# Patient Record
Sex: Female | Born: 1980
Health system: Southern US, Community
[De-identification: ages and names within clinical notes are randomized; demographics above are authoritative.]

## PROBLEM LIST (undated history)

## (undated) ENCOUNTER — Inpatient Hospital Stay (HOSPITAL_COMMUNITY): Payer: Self-pay

## (undated) DIAGNOSIS — N939 Abnormal uterine and vaginal bleeding, unspecified: Secondary | ICD-10-CM

## (undated) DIAGNOSIS — E282 Polycystic ovarian syndrome: Secondary | ICD-10-CM

## (undated) DIAGNOSIS — F411 Generalized anxiety disorder: Secondary | ICD-10-CM

## (undated) DIAGNOSIS — Z8669 Personal history of other diseases of the nervous system and sense organs: Secondary | ICD-10-CM

## (undated) DIAGNOSIS — Z803 Family history of malignant neoplasm of breast: Secondary | ICD-10-CM

## (undated) DIAGNOSIS — K602 Anal fissure, unspecified: Secondary | ICD-10-CM

## (undated) DIAGNOSIS — D649 Anemia, unspecified: Secondary | ICD-10-CM

## (undated) DIAGNOSIS — O139 Gestational [pregnancy-induced] hypertension without significant proteinuria, unspecified trimester: Secondary | ICD-10-CM

## (undated) DIAGNOSIS — M1990 Unspecified osteoarthritis, unspecified site: Secondary | ICD-10-CM

## (undated) DIAGNOSIS — M62838 Other muscle spasm: Secondary | ICD-10-CM

## (undated) DIAGNOSIS — Z8042 Family history of malignant neoplasm of prostate: Secondary | ICD-10-CM

## (undated) DIAGNOSIS — F329 Major depressive disorder, single episode, unspecified: Secondary | ICD-10-CM

## (undated) DIAGNOSIS — Z8719 Personal history of other diseases of the digestive system: Secondary | ICD-10-CM

## (undated) DIAGNOSIS — L680 Hirsutism: Secondary | ICD-10-CM

## (undated) DIAGNOSIS — F32A Depression, unspecified: Secondary | ICD-10-CM

## (undated) DIAGNOSIS — D259 Leiomyoma of uterus, unspecified: Secondary | ICD-10-CM

## (undated) DIAGNOSIS — G709 Myoneural disorder, unspecified: Secondary | ICD-10-CM

## (undated) DIAGNOSIS — Z973 Presence of spectacles and contact lenses: Secondary | ICD-10-CM

## (undated) DIAGNOSIS — Z808 Family history of malignant neoplasm of other organs or systems: Secondary | ICD-10-CM

## (undated) DIAGNOSIS — H539 Unspecified visual disturbance: Secondary | ICD-10-CM

## (undated) DIAGNOSIS — T7840XA Allergy, unspecified, initial encounter: Secondary | ICD-10-CM

## (undated) DIAGNOSIS — Z8041 Family history of malignant neoplasm of ovary: Secondary | ICD-10-CM

## (undated) DIAGNOSIS — IMO0002 Reserved for concepts with insufficient information to code with codable children: Secondary | ICD-10-CM

## (undated) DIAGNOSIS — M791 Myalgia, unspecified site: Secondary | ICD-10-CM

## (undated) DIAGNOSIS — Z806 Family history of leukemia: Secondary | ICD-10-CM

## (undated) DIAGNOSIS — K635 Polyp of colon: Secondary | ICD-10-CM

## (undated) DIAGNOSIS — G35 Multiple sclerosis: Secondary | ICD-10-CM

## (undated) DIAGNOSIS — F419 Anxiety disorder, unspecified: Secondary | ICD-10-CM

## (undated) DIAGNOSIS — N920 Excessive and frequent menstruation with regular cycle: Secondary | ICD-10-CM

## (undated) DIAGNOSIS — E669 Obesity, unspecified: Secondary | ICD-10-CM

## (undated) HISTORY — DX: Family history of malignant neoplasm of prostate: Z80.42

## (undated) HISTORY — DX: Multiple sclerosis: G35

## (undated) HISTORY — DX: Family history of malignant neoplasm of other organs or systems: Z80.8

## (undated) HISTORY — DX: Family history of malignant neoplasm of breast: Z80.3

## (undated) HISTORY — DX: Depression, unspecified: F32.A

## (undated) HISTORY — DX: Reserved for concepts with insufficient information to code with codable children: IMO0002

## (undated) HISTORY — DX: Family history of malignant neoplasm of ovary: Z80.41

## (undated) HISTORY — DX: Major depressive disorder, single episode, unspecified: F32.9

## (undated) HISTORY — DX: Family history of leukemia: Z80.6

## (undated) HISTORY — DX: Unspecified osteoarthritis, unspecified site: M19.90

## (undated) HISTORY — DX: Anal fissure, unspecified: K60.2

## (undated) HISTORY — DX: Polyp of colon: K63.5

## (undated) HISTORY — DX: Obesity, unspecified: E66.9

## (undated) HISTORY — DX: Unspecified visual disturbance: H53.9

## (undated) HISTORY — PX: KNEE ARTHROSCOPY: SHX127

## (undated) HISTORY — PX: DILATION AND CURETTAGE OF UTERUS: SHX78

## (undated) HISTORY — DX: Allergy, unspecified, initial encounter: T78.40XA

## (undated) HISTORY — PX: OTHER SURGICAL HISTORY: SHX169

## (undated) HISTORY — PX: FOOT SURGERY: SHX648

---
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
## 2001-01-02 ENCOUNTER — Encounter: Payer: Self-pay | Admitting: Orthopedic Surgery

## 2001-01-02 ENCOUNTER — Inpatient Hospital Stay (HOSPITAL_COMMUNITY): Admission: EM | Admit: 2001-01-02 | Discharge: 2001-01-04 | Payer: Self-pay | Admitting: Emergency Medicine

## 2001-01-02 ENCOUNTER — Encounter: Payer: Self-pay | Admitting: Emergency Medicine

## 2001-03-24 ENCOUNTER — Emergency Department (HOSPITAL_COMMUNITY): Admission: EM | Admit: 2001-03-24 | Discharge: 2001-03-24 | Payer: Self-pay | Admitting: Emergency Medicine

## 2001-04-09 ENCOUNTER — Ambulatory Visit (HOSPITAL_BASED_OUTPATIENT_CLINIC_OR_DEPARTMENT_OTHER): Admission: RE | Admit: 2001-04-09 | Discharge: 2001-04-10 | Payer: Self-pay | Admitting: Orthopedic Surgery

## 2001-04-09 HISTORY — PX: KNEE ARTHROSCOPY: SUR90

## 2001-04-10 ENCOUNTER — Ambulatory Visit (HOSPITAL_COMMUNITY): Admission: RE | Admit: 2001-04-10 | Discharge: 2001-04-10 | Payer: Self-pay | Admitting: Orthopedic Surgery

## 2001-06-17 ENCOUNTER — Ambulatory Visit (HOSPITAL_COMMUNITY): Admission: RE | Admit: 2001-06-17 | Discharge: 2001-06-17 | Payer: Self-pay | Admitting: Neurology

## 2001-07-09 ENCOUNTER — Encounter: Payer: Self-pay | Admitting: Orthopedic Surgery

## 2001-07-09 ENCOUNTER — Inpatient Hospital Stay (HOSPITAL_COMMUNITY): Admission: RE | Admit: 2001-07-09 | Discharge: 2001-07-10 | Payer: Self-pay | Admitting: Orthopedic Surgery

## 2001-07-09 HISTORY — PX: KNEE ARTHROSCOPY: SHX127

## 2001-10-08 ENCOUNTER — Other Ambulatory Visit: Admission: RE | Admit: 2001-10-08 | Discharge: 2001-10-08 | Payer: Self-pay | Admitting: Obstetrics and Gynecology

## 2001-11-24 ENCOUNTER — Emergency Department (HOSPITAL_COMMUNITY): Admission: EM | Admit: 2001-11-24 | Discharge: 2001-11-24 | Payer: Self-pay | Admitting: Emergency Medicine

## 2002-04-23 HISTORY — PX: FOOT TENDON TRANSFER: SHX1671

## 2003-02-08 ENCOUNTER — Encounter: Payer: Self-pay | Admitting: Emergency Medicine

## 2003-02-08 ENCOUNTER — Emergency Department (HOSPITAL_COMMUNITY): Admission: EM | Admit: 2003-02-08 | Discharge: 2003-02-08 | Payer: Self-pay | Admitting: Emergency Medicine

## 2003-04-24 DIAGNOSIS — K635 Polyp of colon: Secondary | ICD-10-CM

## 2003-04-24 HISTORY — DX: Polyp of colon: K63.5

## 2003-09-30 ENCOUNTER — Other Ambulatory Visit: Admission: RE | Admit: 2003-09-30 | Discharge: 2003-09-30 | Payer: Self-pay | Admitting: Obstetrics and Gynecology

## 2004-03-21 ENCOUNTER — Emergency Department (HOSPITAL_COMMUNITY): Admission: EM | Admit: 2004-03-21 | Discharge: 2004-03-21 | Payer: Self-pay

## 2004-04-23 DIAGNOSIS — G35 Multiple sclerosis: Secondary | ICD-10-CM

## 2004-04-23 HISTORY — DX: Multiple sclerosis: G35

## 2004-07-11 ENCOUNTER — Emergency Department (HOSPITAL_COMMUNITY): Admission: EM | Admit: 2004-07-11 | Discharge: 2004-07-11 | Payer: Self-pay | Admitting: *Deleted

## 2004-10-16 ENCOUNTER — Emergency Department (HOSPITAL_COMMUNITY): Admission: EM | Admit: 2004-10-16 | Discharge: 2004-10-16 | Payer: Self-pay | Admitting: Emergency Medicine

## 2004-12-14 ENCOUNTER — Encounter: Admission: RE | Admit: 2004-12-14 | Discharge: 2004-12-14 | Payer: Self-pay | Admitting: Allergy and Immunology

## 2005-02-05 ENCOUNTER — Emergency Department (HOSPITAL_COMMUNITY): Admission: EM | Admit: 2005-02-05 | Discharge: 2005-02-05 | Payer: Self-pay | Admitting: Emergency Medicine

## 2005-03-01 ENCOUNTER — Emergency Department (HOSPITAL_COMMUNITY): Admission: EM | Admit: 2005-03-01 | Discharge: 2005-03-01 | Payer: Self-pay | Admitting: Emergency Medicine

## 2005-03-22 ENCOUNTER — Emergency Department (HOSPITAL_COMMUNITY): Admission: EM | Admit: 2005-03-22 | Discharge: 2005-03-22 | Payer: Self-pay | Admitting: Emergency Medicine

## 2005-04-12 ENCOUNTER — Emergency Department (HOSPITAL_COMMUNITY): Admission: EM | Admit: 2005-04-12 | Discharge: 2005-04-12 | Payer: Self-pay | Admitting: Emergency Medicine

## 2005-04-23 HISTORY — PX: CHOLECYSTECTOMY: SHX55

## 2005-06-10 ENCOUNTER — Encounter: Admission: RE | Admit: 2005-06-10 | Discharge: 2005-06-10 | Payer: Self-pay | Admitting: Family Medicine

## 2005-07-21 ENCOUNTER — Encounter: Admission: RE | Admit: 2005-07-21 | Discharge: 2005-07-21 | Payer: Self-pay | Admitting: *Deleted

## 2005-08-06 ENCOUNTER — Ambulatory Visit (HOSPITAL_COMMUNITY): Admission: RE | Admit: 2005-08-06 | Discharge: 2005-08-06 | Payer: Self-pay | Admitting: *Deleted

## 2005-08-10 ENCOUNTER — Encounter: Admission: RE | Admit: 2005-08-10 | Discharge: 2005-08-10 | Payer: Self-pay | Admitting: *Deleted

## 2005-09-24 ENCOUNTER — Emergency Department (HOSPITAL_COMMUNITY): Admission: EM | Admit: 2005-09-24 | Discharge: 2005-09-24 | Payer: Self-pay | Admitting: Emergency Medicine

## 2006-01-21 HISTORY — PX: CHOLECYSTECTOMY, LAPAROSCOPIC: SHX56

## 2006-02-04 ENCOUNTER — Emergency Department (HOSPITAL_COMMUNITY): Admission: EM | Admit: 2006-02-04 | Discharge: 2006-02-05 | Payer: Self-pay | Admitting: Emergency Medicine

## 2006-04-23 DIAGNOSIS — Z8759 Personal history of other complications of pregnancy, childbirth and the puerperium: Secondary | ICD-10-CM

## 2006-04-23 HISTORY — DX: Personal history of other complications of pregnancy, childbirth and the puerperium: Z87.59

## 2006-12-10 ENCOUNTER — Emergency Department (HOSPITAL_COMMUNITY): Admission: EM | Admit: 2006-12-10 | Discharge: 2006-12-10 | Payer: Self-pay | Admitting: Emergency Medicine

## 2007-03-11 ENCOUNTER — Inpatient Hospital Stay (HOSPITAL_COMMUNITY): Admission: AD | Admit: 2007-03-11 | Discharge: 2007-03-11 | Payer: Self-pay | Admitting: Obstetrics and Gynecology

## 2007-03-15 ENCOUNTER — Inpatient Hospital Stay (HOSPITAL_COMMUNITY): Admission: AD | Admit: 2007-03-15 | Discharge: 2007-03-15 | Payer: Self-pay | Admitting: Obstetrics and Gynecology

## 2007-03-24 DIAGNOSIS — O139 Gestational [pregnancy-induced] hypertension without significant proteinuria, unspecified trimester: Secondary | ICD-10-CM

## 2007-03-24 HISTORY — DX: Gestational (pregnancy-induced) hypertension without significant proteinuria, unspecified trimester: O13.9

## 2007-03-27 ENCOUNTER — Inpatient Hospital Stay (HOSPITAL_COMMUNITY): Admission: RE | Admit: 2007-03-27 | Discharge: 2007-03-30 | Payer: Self-pay | Admitting: Obstetrics and Gynecology

## 2007-09-23 ENCOUNTER — Emergency Department (HOSPITAL_BASED_OUTPATIENT_CLINIC_OR_DEPARTMENT_OTHER): Admission: EM | Admit: 2007-09-23 | Discharge: 2007-09-24 | Payer: Self-pay | Admitting: Emergency Medicine

## 2007-09-26 ENCOUNTER — Emergency Department (HOSPITAL_BASED_OUTPATIENT_CLINIC_OR_DEPARTMENT_OTHER): Admission: EM | Admit: 2007-09-26 | Discharge: 2007-09-26 | Payer: Self-pay | Admitting: Emergency Medicine

## 2008-01-28 ENCOUNTER — Emergency Department (HOSPITAL_BASED_OUTPATIENT_CLINIC_OR_DEPARTMENT_OTHER): Admission: EM | Admit: 2008-01-28 | Discharge: 2008-01-29 | Payer: Self-pay | Admitting: Emergency Medicine

## 2008-05-12 ENCOUNTER — Emergency Department (HOSPITAL_BASED_OUTPATIENT_CLINIC_OR_DEPARTMENT_OTHER): Admission: EM | Admit: 2008-05-12 | Discharge: 2008-05-12 | Payer: Self-pay | Admitting: Emergency Medicine

## 2008-08-30 ENCOUNTER — Emergency Department (HOSPITAL_BASED_OUTPATIENT_CLINIC_OR_DEPARTMENT_OTHER): Admission: EM | Admit: 2008-08-30 | Discharge: 2008-08-30 | Payer: Self-pay | Admitting: Emergency Medicine

## 2008-10-08 ENCOUNTER — Emergency Department (HOSPITAL_BASED_OUTPATIENT_CLINIC_OR_DEPARTMENT_OTHER): Admission: EM | Admit: 2008-10-08 | Discharge: 2008-10-08 | Payer: Self-pay | Admitting: Emergency Medicine

## 2008-10-21 LAB — CONVERTED CEMR LAB: Pap Smear: NORMAL

## 2008-10-26 DIAGNOSIS — E669 Obesity, unspecified: Secondary | ICD-10-CM | POA: Insufficient documentation

## 2008-11-04 DIAGNOSIS — B078 Other viral warts: Secondary | ICD-10-CM | POA: Insufficient documentation

## 2008-11-14 ENCOUNTER — Ambulatory Visit: Payer: Self-pay | Admitting: Diagnostic Radiology

## 2008-11-14 ENCOUNTER — Emergency Department (HOSPITAL_BASED_OUTPATIENT_CLINIC_OR_DEPARTMENT_OTHER): Admission: EM | Admit: 2008-11-14 | Discharge: 2008-11-14 | Payer: Self-pay | Admitting: Emergency Medicine

## 2008-11-16 DIAGNOSIS — M129 Arthropathy, unspecified: Secondary | ICD-10-CM | POA: Insufficient documentation

## 2008-11-29 DIAGNOSIS — J309 Allergic rhinitis, unspecified: Secondary | ICD-10-CM | POA: Insufficient documentation

## 2009-04-23 HISTORY — PX: HAMMER TOE SURGERY: SHX385

## 2009-06-19 ENCOUNTER — Emergency Department (HOSPITAL_BASED_OUTPATIENT_CLINIC_OR_DEPARTMENT_OTHER): Admission: EM | Admit: 2009-06-19 | Discharge: 2009-06-19 | Payer: Self-pay | Admitting: Emergency Medicine

## 2009-07-30 ENCOUNTER — Ambulatory Visit: Payer: Self-pay | Admitting: Diagnostic Radiology

## 2009-07-30 ENCOUNTER — Emergency Department (HOSPITAL_BASED_OUTPATIENT_CLINIC_OR_DEPARTMENT_OTHER): Admission: EM | Admit: 2009-07-30 | Discharge: 2009-07-30 | Payer: Self-pay | Admitting: Emergency Medicine

## 2009-09-13 ENCOUNTER — Encounter (INDEPENDENT_AMBULATORY_CARE_PROVIDER_SITE_OTHER): Payer: Self-pay | Admitting: *Deleted

## 2009-09-13 ENCOUNTER — Encounter: Admission: RE | Admit: 2009-09-13 | Discharge: 2009-10-31 | Payer: Self-pay | Admitting: Orthopedic Surgery

## 2009-09-16 DIAGNOSIS — R109 Unspecified abdominal pain: Secondary | ICD-10-CM | POA: Insufficient documentation

## 2009-09-21 DIAGNOSIS — IMO0002 Reserved for concepts with insufficient information to code with codable children: Secondary | ICD-10-CM

## 2009-09-21 DIAGNOSIS — Z8711 Personal history of peptic ulcer disease: Secondary | ICD-10-CM

## 2009-09-21 HISTORY — DX: Reserved for concepts with insufficient information to code with codable children: IMO0002

## 2009-09-21 HISTORY — DX: Personal history of peptic ulcer disease: Z87.11

## 2009-09-22 ENCOUNTER — Ambulatory Visit (HOSPITAL_BASED_OUTPATIENT_CLINIC_OR_DEPARTMENT_OTHER): Admission: RE | Admit: 2009-09-22 | Discharge: 2009-09-22 | Payer: Self-pay | Admitting: Orthopedic Surgery

## 2009-09-22 HISTORY — PX: HAMMER TOE SURGERY: SHX385

## 2009-09-27 ENCOUNTER — Ambulatory Visit: Payer: Self-pay | Admitting: Gastroenterology

## 2009-09-27 DIAGNOSIS — Z860100 Personal history of colon polyps, unspecified: Secondary | ICD-10-CM | POA: Insufficient documentation

## 2009-09-27 LAB — CONVERTED CEMR LAB
ALT: 25 units/L (ref 0–35)
AST: 20 units/L (ref 0–37)
Albumin: 4 g/dL (ref 3.5–5.2)
Alkaline Phosphatase: 54 units/L (ref 39–117)
Amylase: 36 units/L (ref 27–131)
BUN: 9 mg/dL (ref 6–23)
Basophils Absolute: 0 10*3/uL (ref 0.0–0.1)
Basophils Relative: 0.1 % (ref 0.0–3.0)
Bilirubin, Direct: 0.1 mg/dL (ref 0.0–0.3)
CO2: 30 meq/L (ref 19–32)
Calcium: 9.1 mg/dL (ref 8.4–10.5)
Chloride: 104 meq/L (ref 96–112)
Creatinine, Ser: 0.8 mg/dL (ref 0.4–1.2)
Eosinophils Absolute: 0.1 10*3/uL (ref 0.0–0.7)
Eosinophils Relative: 3 % (ref 0.0–5.0)
Ferritin: 38.5 ng/mL (ref 10.0–291.0)
Folate: 6.5 ng/mL
GFR calc non Af Amer: 115.25 mL/min (ref >60)
Glucose, Bld: 74 mg/dL (ref 70–99)
HCT: 38.3 % (ref 36.0–46.0)
Hemoglobin: 12.9 g/dL (ref 12.0–15.0)
Iron: 52 ug/dL (ref 42–145)
Lipase: 7 units/L — ABNORMAL LOW (ref 11.0–59.0)
Lymphocytes Relative: 7.6 % — ABNORMAL LOW (ref 12.0–46.0)
Lymphs Abs: 0.3 10*3/uL — ABNORMAL LOW (ref 0.7–4.0)
MCHC: 33.5 g/dL (ref 30.0–36.0)
MCV: 83.7 fL (ref 78.0–100.0)
Monocytes Absolute: 0.2 10*3/uL (ref 0.1–1.0)
Monocytes Relative: 5.4 % (ref 3.0–12.0)
Neutro Abs: 3.3 10*3/uL (ref 1.4–7.7)
Neutrophils Relative %: 83.9 % — ABNORMAL HIGH (ref 43.0–77.0)
Platelets: 243 10*3/uL (ref 150.0–400.0)
Potassium: 4.1 meq/L (ref 3.5–5.1)
RBC: 4.58 M/uL (ref 3.87–5.11)
RDW: 15.2 % — ABNORMAL HIGH (ref 11.5–14.6)
Saturation Ratios: 18.7 % — ABNORMAL LOW (ref 20.0–50.0)
Sed Rate: 29 mm/hr — ABNORMAL HIGH (ref 0–22)
Sodium: 141 meq/L (ref 135–145)
TSH: 0.24 microintl units/mL — ABNORMAL LOW (ref 0.35–5.50)
Total Bilirubin: 0.4 mg/dL (ref 0.3–1.2)
Total Protein: 7.2 g/dL (ref 6.0–8.3)
Transferrin: 199 mg/dL — ABNORMAL LOW (ref 212.0–360.0)
Vitamin B-12: 330 pg/mL (ref 211–911)
WBC: 3.9 10*3/uL — ABNORMAL LOW (ref 4.5–10.5)

## 2009-09-29 ENCOUNTER — Telehealth: Payer: Self-pay | Admitting: Gastroenterology

## 2009-09-30 ENCOUNTER — Encounter (INDEPENDENT_AMBULATORY_CARE_PROVIDER_SITE_OTHER): Payer: Self-pay | Admitting: *Deleted

## 2009-10-03 ENCOUNTER — Ambulatory Visit: Payer: Self-pay | Admitting: Gastroenterology

## 2009-10-12 ENCOUNTER — Ambulatory Visit: Payer: Self-pay | Admitting: Gastroenterology

## 2009-10-12 LAB — CONVERTED CEMR LAB: UREASE: NEGATIVE

## 2009-10-14 ENCOUNTER — Encounter: Payer: Self-pay | Admitting: Gastroenterology

## 2009-10-31 ENCOUNTER — Telehealth: Payer: Self-pay | Admitting: Family

## 2009-10-31 ENCOUNTER — Ambulatory Visit: Payer: Self-pay | Admitting: Family

## 2009-10-31 DIAGNOSIS — F329 Major depressive disorder, single episode, unspecified: Secondary | ICD-10-CM | POA: Insufficient documentation

## 2009-10-31 DIAGNOSIS — G35A Relapsing-remitting multiple sclerosis: Secondary | ICD-10-CM | POA: Insufficient documentation

## 2009-10-31 DIAGNOSIS — Z87898 Personal history of other specified conditions: Secondary | ICD-10-CM | POA: Insufficient documentation

## 2009-10-31 DIAGNOSIS — G35 Multiple sclerosis: Secondary | ICD-10-CM | POA: Insufficient documentation

## 2009-11-02 LAB — CONVERTED CEMR LAB
ALT: 28 units/L (ref 0–35)
AST: 17 units/L (ref 0–37)
Albumin: 4.5 g/dL (ref 3.5–5.2)
Alkaline Phosphatase: 49 units/L (ref 39–117)
BUN: 7 mg/dL (ref 6–23)
CO2: 25 meq/L (ref 19–32)
Calcium: 9.4 mg/dL (ref 8.4–10.5)
Chloride: 103 meq/L (ref 96–112)
Creatinine, Ser: 0.81 mg/dL (ref 0.40–1.20)
Glucose, Bld: 100 mg/dL — ABNORMAL HIGH (ref 70–99)
HCT: 40.1 % (ref 36.0–46.0)
Hemoglobin: 13.4 g/dL (ref 12.0–15.0)
MCHC: 33.4 g/dL (ref 30.0–36.0)
MCV: 82 fL (ref 78.0–100.0)
Platelets: 234 10*3/uL (ref 150–400)
Potassium: 4.4 meq/L (ref 3.5–5.3)
RBC: 4.89 M/uL (ref 3.87–5.11)
RDW: 15 % (ref 11.5–15.5)
Sodium: 139 meq/L (ref 135–145)
TSH: 0.823 microintl units/mL (ref 0.350–4.500)
Total Bilirubin: 0.4 mg/dL (ref 0.3–1.2)
Total Protein: 7.6 g/dL (ref 6.0–8.3)
WBC: 3.7 10*3/uL — ABNORMAL LOW (ref 4.0–10.5)

## 2009-11-03 ENCOUNTER — Telehealth: Payer: Self-pay | Admitting: Family

## 2009-11-07 ENCOUNTER — Encounter: Admission: RE | Admit: 2009-11-07 | Discharge: 2010-01-04 | Payer: Self-pay | Admitting: Orthopedic Surgery

## 2009-11-08 ENCOUNTER — Ambulatory Visit: Payer: Self-pay | Admitting: Family

## 2009-11-08 DIAGNOSIS — K219 Gastro-esophageal reflux disease without esophagitis: Secondary | ICD-10-CM | POA: Insufficient documentation

## 2009-11-23 ENCOUNTER — Encounter: Payer: Self-pay | Admitting: Family

## 2009-11-23 DIAGNOSIS — B009 Herpesviral infection, unspecified: Secondary | ICD-10-CM | POA: Insufficient documentation

## 2009-11-23 DIAGNOSIS — E559 Vitamin D deficiency, unspecified: Secondary | ICD-10-CM | POA: Insufficient documentation

## 2009-11-23 DIAGNOSIS — A6000 Herpesviral infection of urogenital system, unspecified: Secondary | ICD-10-CM | POA: Insufficient documentation

## 2009-12-06 LAB — CONVERTED CEMR LAB: Pap Smear: NORMAL

## 2009-12-09 ENCOUNTER — Ambulatory Visit: Payer: Self-pay | Admitting: Family

## 2009-12-09 DIAGNOSIS — R7309 Other abnormal glucose: Secondary | ICD-10-CM | POA: Insufficient documentation

## 2009-12-09 LAB — CONVERTED CEMR LAB: Hgb A1c MFr Bld: 5.7 % — ABNORMAL HIGH (ref <5.7)

## 2009-12-12 ENCOUNTER — Encounter: Payer: Self-pay | Admitting: Family

## 2009-12-20 ENCOUNTER — Ambulatory Visit: Payer: Self-pay | Admitting: Pulmonary Disease

## 2009-12-20 DIAGNOSIS — G4710 Hypersomnia, unspecified: Secondary | ICD-10-CM | POA: Insufficient documentation

## 2009-12-20 DIAGNOSIS — G4730 Sleep apnea, unspecified: Secondary | ICD-10-CM

## 2010-01-02 ENCOUNTER — Ambulatory Visit: Payer: Self-pay | Admitting: Family

## 2010-01-09 ENCOUNTER — Telehealth: Payer: Self-pay | Admitting: Family

## 2010-01-10 ENCOUNTER — Ambulatory Visit: Payer: Self-pay | Admitting: Family

## 2010-01-10 ENCOUNTER — Ambulatory Visit (HOSPITAL_BASED_OUTPATIENT_CLINIC_OR_DEPARTMENT_OTHER): Admission: RE | Admit: 2010-01-10 | Discharge: 2010-01-10 | Payer: Self-pay | Admitting: Internal Medicine

## 2010-01-10 ENCOUNTER — Ambulatory Visit: Payer: Self-pay | Admitting: Diagnostic Radiology

## 2010-01-17 ENCOUNTER — Encounter: Payer: Self-pay | Admitting: Pulmonary Disease

## 2010-02-06 ENCOUNTER — Encounter: Payer: Self-pay | Admitting: Pulmonary Disease

## 2010-02-10 ENCOUNTER — Encounter (INDEPENDENT_AMBULATORY_CARE_PROVIDER_SITE_OTHER): Payer: Self-pay | Admitting: *Deleted

## 2010-03-03 ENCOUNTER — Telehealth: Payer: Self-pay | Admitting: Family

## 2010-03-03 ENCOUNTER — Ambulatory Visit: Payer: Self-pay | Admitting: Family

## 2010-03-28 ENCOUNTER — Telehealth: Payer: Self-pay | Admitting: Family

## 2010-03-31 ENCOUNTER — Ambulatory Visit: Payer: Self-pay | Admitting: Family

## 2010-03-31 DIAGNOSIS — F411 Generalized anxiety disorder: Secondary | ICD-10-CM | POA: Insufficient documentation

## 2010-04-03 ENCOUNTER — Telehealth: Payer: Self-pay | Admitting: Family

## 2010-04-06 ENCOUNTER — Encounter: Payer: Self-pay | Admitting: Family

## 2010-04-11 ENCOUNTER — Telehealth: Payer: Self-pay | Admitting: Family

## 2010-05-12 ENCOUNTER — Ambulatory Visit
Admission: RE | Admit: 2010-05-12 | Discharge: 2010-05-12 | Payer: Self-pay | Source: Home / Self Care | Attending: Family | Admitting: Family

## 2010-05-12 ENCOUNTER — Encounter: Payer: Self-pay | Admitting: Family

## 2010-05-12 DIAGNOSIS — L680 Hirsutism: Secondary | ICD-10-CM | POA: Insufficient documentation

## 2010-05-14 ENCOUNTER — Encounter: Payer: Self-pay | Admitting: Gastroenterology

## 2010-05-15 ENCOUNTER — Encounter: Payer: Self-pay | Admitting: Family

## 2010-05-15 LAB — CONVERTED CEMR LAB
FSH: 7.4 milliintl units/mL
Prolactin: 8.4 ng/mL
TSH: 0.833 microintl units/mL (ref 0.350–4.500)
Testosterone: 66.31 ng/dL (ref 10–70)

## 2010-05-22 ENCOUNTER — Encounter: Payer: Self-pay | Admitting: Family

## 2010-05-22 ENCOUNTER — Telehealth: Payer: Self-pay | Admitting: Family

## 2010-05-23 NOTE — Assessment & Plan Note (Signed)
Summary: ABD PAIN AFTER EATING OR DRINKING...AS.   History of Present Illness Visit Type: Initial Visit Primary GI MD: Sheryn Bison MD FACP FAGA Primary Provider: Elizabeth Palau, PA Chief Complaint: Chronic abdominal pain, more severe after meals History of Present Illness:   30 year old African American female referred for evaluation of 3 weeks of epigastric and left lower quadrant abdominal pain gas, bloating, and worsening with eating.  This patient has stable  MS and is taking Gilenya 500mg . per neurology.she also has had problems with recurrent tendinitis in her right foot requiring surgical intervention last performed a week ago. She's had 3 weeks of nausea, left upper quadrant-epigastric-left lower quadrant pain which is a 6/10 and has no real worsening or alleviating elements except for nausea precipitated by eating. She's been seen at [facility] and placed on Nexium 40 mg twice a day and continues with acid reflux symptoms. CT Scan of abdomen was obtained and was unremarkable with evidence of previous cholecystectomy.  Patient denies abuse of alcohol, NSAIDs, or salicylates. She had gallstone pancreatitis and what sounds like Escherichia coli sepsis in 2004-2005. That is the time she underwent cholecystectomy. She denies recurrent hepatobiliary problems such as clay colored stools, dark urine, fever chills. She takes Percocet with some improvement but this causes sedation. She also is on cyclobenzaprine 20 mg a day,Valacylor 500 mg twice a day for chronic herpes, and p.r.n. Percocet. She again denies NSAID use. She has an IUD in place and does not menstruate.  She apparently had colonoscopy in [facility] several years ago with removal of colon polyps. Do not have these records for review. With one of her pregnancies she had an anal fissure. She has chronic migraine headaches in addition to her MS. She's not a smoker or heavy use of alcohol.   GI Review of Systems    Reports  abdominal pain, acid reflux, belching, bloating, heartburn, loss of appetite, nausea, and  weight loss.     Location of  Abdominal pain: epigastric area. Weight loss of 3 pounds over 3 weeks.   Denies chest pain, dysphagia with liquids, dysphagia with solids, vomiting, vomiting blood, and  weight gain.      Reports change in bowel habits.     Denies anal fissure, black tarry stools, constipation, diarrhea, diverticulosis, fecal incontinence, heme positive stool, hemorrhoids, irritable bowel syndrome, jaundice, light color stool, liver problems, rectal bleeding, and  rectal pain.    Current Medications (verified): 1)  Nexium 40 Mg Cpdr (Esomeprazole Magnesium) .... Two Times A Day 2)  Cyclobenzaprine Hcl 10 Mg Tabs (Cyclobenzaprine Hcl) .... Two Times A Day 3)  Nortriptyline Hcl 50 Mg Caps (Nortriptyline Hcl) .... At Bedtime 4)  Valacyclovir Hcl 500 Mg Tabs (Valacyclovir Hcl) .... Two Times A Day 5)  Gilenya 0.5 Mg Caps (Fingolimod Hcl) .... Once Daily 6)  Percocet 5-325 Mg Tabs (Oxycodone-Acetaminophen) .... As Needed For Pain Every 6-8 Hours  Allergies (verified): 1)  ! Aspirin 2)  ! Penicillin 3)  ! * Dilaudid  Past History:  Past medical, surgical, family and social histories (including risk factors) reviewed for relevance to current acute and chronic problems.  Past Medical History: Migraine Headaches Multiple Sclerosis Anal Fissure Arthritis Obesity  Past Surgical History: C- Section Knee Arthroscopy x3 Foot Surgery x2 Cholecystectomy Hernia Surgery  Family History: Reviewed history from 09/26/2009 and no changes required. Family History of Diabetes:  Hypertension Stroke Family History of Ovarian Cancer:Mother Family History of Pancreatic Cancer:Mothrt Family History of Stomach Cancer:Mother Family  History of Uterine Cancer:Mother Family History of Heart Disease: Mother Family History of Kidney Disease:Mother  Social History: Reviewed history from 09/26/2009  and no changes required. Patient has never smoked.  Alcohol Use - yes occasional Illicit Drug Use - no Occupation: Student  Review of Systems       The patient complains of allergy/sinus, arthritis/joint pain, back pain, headaches-new, and sleeping problems.  The patient denies anemia, anxiety-new, blood in urine, breast changes/lumps, change in vision, confusion, cough, coughing up blood, depression-new, fainting, fatigue, fever, hearing problems, heart murmur, heart rhythm changes, itching, menstrual pain, muscle pains/cramps, night sweats, nosebleeds, pregnancy symptoms, shortness of breath, skin rash, sore throat, swelling of feet/legs, swollen lymph glands, thirst - excessive , urination - excessive , urination changes/pain, urine leakage, vision changes, and voice change.    Vital Signs:  Patient profile:   30 year old female Height:      59 inches Weight:      318.13 pounds BMI:     64.49 Pulse rate:   76 / minute Pulse rhythm:   regular BP sitting:   120 / 90  (left arm) Cuff size:   regular  Vitals Entered By: June McMurray CMA Duncan Dull) (September 27, 2009 3:02 PM)  Physical Exam  General:  Well developed, well nourished, no acute distress.healthy appearing and obese.   Head:  Normocephalic and atraumatic. Eyes:  PERRLA, no icterus. Neck:  Supple; no masses or thyromegaly. Lungs:  Clear throughout to auscultation. Heart:  Regular rate and rhythm; no murmurs, rubs,  or bruits. Abdomen:  Soft, nontender and nondistended. No masses, hepatosplenomegaly or hernias noted. Normal bowel sounds.obese.   Rectal:  Normal exam.hemocult negative.   Msk:  Symmetrical with no gross deformities. Normal posture.Soft cast to right foot area. Extremities:  No clubbing, cyanosis, edema or deformities noted. Neurologic:  Alert and  oriented x4;  grossly normal neurologically. Skin:  Intact without significant lesions or rashes. Cervical Nodes:  No significant cervical adenopathy. Psych:  Alert and  cooperative. Normal mood and affect.   Impression & Recommendations:  Problem # 1:  ABDOMINAL PAIN -GENERALIZED (ICD-789.07) Assessment Unchanged Unusual presentation of pain certainly suggestive of subacute pancreatitis perhaps from retained common bile duct stone. As per above she is status post cholecystectomy for cholelithiasis. There is no reason to suspect penetrating peptic ulcer disease, and she will twice a day Nexium without improvement. However we will repeat her CT scan,, check labs, and Carafate suspension p.r.n. to her regime and proceed accordingly. She may need endoscopy and colonoscopy exams. She has Percocet to use p.r.n. and I've also given her some Phenergan 12.5 mg tablets to use every 6-8 hours as needed. She is to continue to avoid NSAIDs and alcohol. Orders: TLB-CBC Platelet - w/Differential (85025-CBCD) TLB-BMP (Basic Metabolic Panel-BMET) (80048-METABOL) TLB-Hepatic/Liver Function Pnl (80076-HEPATIC) TLB-TSH (Thyroid Stimulating Hormone) (84443-TSH) TLB-B12, Serum-Total ONLY (09811-B14) TLB-Ferritin (82728-FER) TLB-Folic Acid (Folate) (82746-FOL) TLB-IBC Pnl (Iron/FE;Transferrin) (83550-IBC) TLB-Amylase (82150-AMYL) TLB-Lipase (83690-LIPASE) TLB-Sedimentation Rate (ESR) (85652-ESR) T-Trypsinogen Serum (78295-62130)  Problem # 2:  NAUSEA (ICD-787.02) Assessment: Unchanged  Orders: TLB-CBC Platelet - w/Differential (85025-CBCD) TLB-BMP (Basic Metabolic Panel-BMET) (80048-METABOL) TLB-Hepatic/Liver Function Pnl (80076-HEPATIC) TLB-TSH (Thyroid Stimulating Hormone) (84443-TSH) TLB-B12, Serum-Total ONLY (86578-I69) TLB-Ferritin (82728-FER) TLB-Folic Acid (Folate) (82746-FOL) TLB-IBC Pnl (Iron/FE;Transferrin) (83550-IBC) TLB-Amylase (82150-AMYL) TLB-Lipase (83690-LIPASE) TLB-Sedimentation Rate (ESR) (85652-ESR) T-Trypsinogen Serum (62952-84132)  Problem # 3:  CHOLECYSTECTOMY, LAPAROSCOPIC, HX OF (ICD-V45.79) Assessment: Unchanged  Problem # 4:  PERSONAL  HX COLONIC POLYPS (ICD-V12.72) Assessment: Unchanged  Problem # 5:  MORBID  OBESITY (ICD-278.01) Assessment: Unchanged  Problem # 6:  UNSPECIFIED ORTHOPEDIC AFTERCARE (ICD-V54.9) Assessment: Comment Only  Patient Instructions: 1)  Please go to the basement for lab work. 2)  You are scheduled for a repeat scan. 3)  Prescriptions will be sent to your pharmacy for Carafate and Phenergan. 4)  The medication list was reviewed and reconciled.  All changed / newly prescribed medications were explained.  A complete medication list was provided to the patient / caregiver. 5)  Copy sent to : Elizabeth Palau, PA 6)  Please continue current medications.  Prescriptions: PROMETHAZINE HCL 12.5 MG TABS (PROMETHAZINE HCL) 1 by mouth q 6 hrs as needed.  May cause drowsiness  #30 x 1   Entered by:   Ashok Cordia RN   Authorized by:   Mardella Layman MD [facility]   Signed by:   Ashok Cordia RN on 09/27/2009   Method used:   Electronically to        [facility] (retail)       [address]       [place], [place]  [address]       Ph: [phone] or [phone]       Fax: [phone]   RxID:   [id number] CARAFATE 1 GM/10ML  SUSP (SUCRALFATE) 1 gm q 6 hrs  #14 oz x 1   Entered by:   Ashok Cordia RN   Authorized by:   Mardella Layman MD [facility]   Signed by:   Ashok Cordia RN on 09/27/2009   Method used:   Electronically to        [facility] (retail)       [address]       [place], [place]  [address]       Ph: [phone] or [phone]       Fax: [phone]   RxID:   [id number]   Appended Document: ABD PAIN AFTER EATING OR DRINKING...AS.    Clinical Lists Changes  Orders: Added new Referral order of CT Abdomen/Pelvis with Contrast (CT Abd/Pelvis w/con) - Signed

## 2010-05-23 NOTE — Assessment & Plan Note (Signed)
Summary: congestion body aches headache feels crummy /mhf--Rm 5   Vital Signs:  Patient profile:   30 year old female Height:      68.75 inches Weight:      314.50 pounds BMI:     46.95 Temp:     98.1 degrees F oral Pulse rate:   72 / minute Pulse rhythm:   regular Resp:     18 per minute BP sitting:   118 / 90  (right arm) Cuff size:   thigh CC: Rm 5  PT states she has had sore throat since Wednesday. Now has body aches, non productive  cough, sneezing and nausea. Is Patient Diabetic? No Pain Assessment Patient in pain? no        Primary Care [place]en:  Lemont Fillers FNP  CC:  Rm 5  PT states she has had sore throat since Wednesday. Now has body aches, non productive  cough, and sneezing and nausea..  History of Present Illness: Jessica Roberson is a 30 year old female who presents who presents with complaint of sore throat, headache, anorexia, myalgias, nausea, malaise.  Started last wednesday.  Has tried Vicks cough/sore throat, comtrex, nyquil with minimal improvement. + sick contacts.  Allergies: 1)  ! Aspirin 2)  ! Penicillin 3)  ! * Dilaudid 4)  ! Tramadol Hcl  Past History:  Past Medical History: Last updated: 12/20/2009 Migraine Headaches Multiple Sclerosis -2004 Anal Fissure Arthritis Obesity Depression Ulcer--09-2009 Allergies Colon polyp--2005  Past Surgical History: Last updated: 10/31/2009 C- Section Knee Arthroscopy x3 Foot Surgery x2 Cholecystectomy--2007 Hernia Surgery--1984 colonoscopy--2005 Mallet Toe correction--2011 Tendon Transfer--2004  Physical Exam  General:  Tired appearing AA female, awake, alert and in NAD Head:  Normocephalic and atraumatic without obvious abnormalities. No apparent alopecia or balding. Ears:  External ear exam shows no significant lesions or deformities.  Otoscopic examination reveals clear canals, tympanic membranes are intact bilaterally without bulging, retraction, inflammation or discharge. Hearing is  grossly normal bilaterally. Mouth:  mild pharyngeal erythema without tonsilar exudates exudates. Neck:  No deformities, masses, or tenderness noted. Lungs:  Normal respiratory effort, chest expands symmetrically. Lungs are clear to auscultation, no crackles or wheezes. Heart:  Normal rate and regular rhythm. S1 and S2 normal without gallop, murmur, click, rub or other extra sounds.   Impression & Recommendations:  Problem # 1:  PHARYNGITIS, VIRAL, ACUTE (ICD-462) Assessment New Rapid strep is negative.  Will plan conservative treatment with Tylenol, topical anesthetics.  Pt instructed to call if fever, if symptoms worsen, or if they do not improve.   Complete Medication List: 1)  Cyclobenzaprine Hcl 10 Mg Tabs (Cyclobenzaprine hcl) .... Two times a day 2)  Nortriptyline Hcl 25 Mg Caps (Nortriptyline hcl) .... One tab by mouth at bedtime 3)  Valacyclovir Hcl 500 Mg Tabs (Valacyclovir hcl) .... Two times a day 4)  Gilenya 0.5 Mg Caps (Fingolimod hcl) .... Once daily 5)  Benadryl 25 Mg Caps (Diphenhydramine hcl) .... As needed. 6)  Nexium 40 Mg Cpdr (Esomeprazole magnesium) .... One tablet by mouth daily 7)  Amantadine Hcl 100 Mg Caps (Amantadine hcl) .Marland Kitchen.. 1 two times a day  Patient Instructions: 1)  Gargle twice daily with salt water. 2)  Take Tylenol 650mg  every 6 hours as needed for pain 3)  You may use over the counter Cepacol lozenges or Chloraseptic spray as needed for sore throat. 4)  Call if you develop fever over 100, if symptoms worsen, or if they are not improved in 48 to  72 hours.  Current Allergies (reviewed today): ! ASPIRIN ! PENICILLIN ! * DILAUDID ! TRAMADOL HCL

## 2010-05-23 NOTE — Letter (Signed)
Summary: Records Dated 10-26-08 thru 10-12-09/[facility]  Records Dated 10-26-08 thru 10-12-09/[facility]   Imported By: Lanelle Bal 12/02/2009 14:06:34  _____________________________________________________________________  External Attachment:    Type:   Image     Comment:   External Document

## 2010-05-23 NOTE — Letter (Signed)
Summary: EGD Instructions  [facility]  [address] [place], [place] [address]   Phone: [phone]  Fax: [phone]       Jessica Roberson    [birth year]    MRN: [id number]       Procedure Day Dorna Bloom:  [facility]  10/12/09     Arrival Time:  9:30am     Procedure Time: 10:30am     Location of Procedure:                    [facility] (4th Floor)    PREPARATION FOR ENDOSCOPY   On [facility] 06/22,  THE DAY OF THE PROCEDURE:  1.   No solid foods, milk or milk products are allowed after midnight the night before your procedure.  2.   Do not drink anything colored red or purple.  Avoid juices with pulp.  No orange juice.  3.  You may drink clear liquids until 8:30am , which is 2 hours before your procedure.                                                                                                CLEAR LIQUIDS INCLUDE: Water Jello Ice Popsicles Tea (sugar ok, no milk/cream) Powdered fruit flavored drinks Coffee (sugar ok, no milk/cream) Gatorade Juice: apple, white grape, white cranberry  Lemonade Clear bullion, consomm, broth Carbonated beverages (any kind) Strained chicken noodle soup Hard Candy   MEDICATION INSTRUCTIONS  Unless otherwise instructed, you should take regular prescription medications with a small sip of water as early as possible the morning of your procedure.             OTHER INSTRUCTIONS  You will need a responsible adult at least 29 years of age to accompany you and drive you home.   This person must remain in the waiting room during your procedure.  Wear loose fitting clothing that is easily removed.  Leave jewelry and other valuables at home.  However, you may wish to bring a book to read or an iPod/MP3 player to listen to music as you wait for your procedure to start.  Remove all body piercing jewelry and leave at home.  Total time from sign-in until discharge is approximately 2-3 hours.  You  should go home directly after your procedure and rest.  You can resume normal activities the day after your procedure.  The day of your procedure you should not:   Drive   Make legal decisions   Operate machinery   Drink alcohol   Return to work  You will receive specific instructions about eating, activities and medications before you leave.    The above instructions have been reviewed and explained to me by   Ezra Sites RN  October 03, 2009 9:24 AM     I fully understand and can verbalize these instructions _____________________________ Date _________

## 2010-05-23 NOTE — Letter (Signed)
Summary[facility]  [facility]   Imported By: Lester Midway 01/27/2010 09:30:54  _____________________________________________________________________  External Attachment:    Type:   Image     Comment:   External Document

## 2010-05-23 NOTE — Progress Notes (Signed)
  Phone Note Outgoing Call   Call placed by: Lemont Fillers FNP,  November 03, 2009 8:46 AM Call placed to: Patient Summary of Call: Case reviewed by Dr. Artist Pais, he recommended decreaseing nortriptylline as this could be attributing to her sleepiness.  Spoke to patient, she is currently taking two 25mg  tabs.  Wil decrease to 25mg  at bedtime and see if this helps.  Instructed pt to call if her migraines worsen and to f/u in August as planned.  She verbalized understanding. Initial call taken by: Lemont Fillers FNP,  November 03, 2009 8:48 AM    New/Updated Medications: NORTRIPTYLINE HCL 25 MG CAPS (NORTRIPTYLINE HCL) one tab by mouth at bedtime

## 2010-05-23 NOTE — Procedures (Signed)
Summary: Upper Endoscopy  Patient: [name] Note: All result statuses are Final unless otherwise noted.  Tests: (1) Upper Endoscopy (EGD)   EGD Upper Endoscopy       DONE     [facility]     [address]     [place], [place]  [address]           ENDOSCOPY PROCEDURE REPORT           PATIENT:  Jessica Roberson, Jessica Roberson  MR#:  [id number]     BIRTHDATE:  [birth year], 28 yrs. old  GENDER:  female           ENDOSCOPIST:  Vania Rea. Jarold Motto, MD, [facility]     Referred by:           PROCEDURE DATE:  10/12/2009     PROCEDURE:  EGD with biopsy     ASA CLASS:  Class II     INDICATIONS:  abdominal pain           MEDICATIONS:   Fentanyl 50 mcg IV, Versed 5 mg IV     TOPICAL ANESTHETIC:  Exactacain Spray           DESCRIPTION OF PROCEDURE:   After the risks benefits and     alternatives of the procedure were thoroughly explained, informed     consent was obtained.  The [facility] [id number] endoscope was     introduced through the mouth and advanced to the second portion of     the duodenum, limited by retching and gagging.   The instrument     was slowly withdrawn as the mucosa was fully examined.     <<PROCEDUREIMAGES>>           Gastropathy was found. LINEAR EROSIONS IN BODY AND FUNDUS NOTED     AND BIOPSIED.  Normal duodenal folds were noted.  The esophagus     and gastroesophageal junction were completely normal in     appearance.    FUNDAL GASTRITIS,,,  The scope was then withdrawn     from the patient and the procedure completed.           COMPLICATIONS:  None           ENDOSCOPIC IMPRESSION:     1) Gastropathy     2) Normal duodenal folds     3) Normal esophagus     PROBABLE HEALING NSAID DAMAGE VS,ACUTE H.PYLORI INFECTION.     RECOMMENDATIONS:     1) Await biopsy results     2) Rx CLO if positive     3) continue current medications           REPEAT EXAM:  No           ______________________________     Vania Rea. Jarold Motto, MD, [facility]           CC:           n.  eSIGNED:   Vania Rea. Lizzie Cokley at 10/12/2009 11:17 AM           Campbell Lerner, [id number]  Note: An exclamation mark (!) indicates a result that was not dispersed into the flowsheet. Document Creation Date: 10/12/2009 11:18 AM _______________________________________________________________________  (1) Order result status: Final Collection or observation date-time: 10/12/2009 11:08 Requested date-time:  Receipt date-time:  Reported date-time:  Referring Physician:   Ordering Physician: Sheryn Bison [phone]) Specimen Source:  Source: Jessica Roberson Order Number: [id number] Lab site:

## 2010-05-23 NOTE — Letter (Signed)
Summary: Patient Jessica Roberson Biopsy Results  [facility]  [address] [place], [place] [address]   Phone: [phone]  Fax: [phone]        October 14, 2009 MRN: [id number]    [facility] [address] [place], [place]  [address]    Dear Jessica Roberson,  I am pleased to inform you that the biopsies taken during your recent endoscopic examination did not show any evidence of cancer upon pathologic examination.  Additional information/recommendations:  __No further action is needed at this time.  Please follow-up with      your primary care physician for your other healthcare needs.  __ Please call [phone] to schedule a return visit to review      your condition.  _X_ Continue with the treatment plan as outlined on the day of your      exam.  __ You should have a repeat endoscopic examination for this problem              in _ months/years.   Please call us if you are having persistent problems or have questions about your condition that have not been fully answered at this time.  Sincerely,  Jessica Layman MD [facility]  This letter has been electronically signed by your physician.  Appended Document: Patient Notice-Endo Biopsy Results letter mailed.

## 2010-05-23 NOTE — Letter (Signed)
Summary: [place] No Show Letter  [place] [facility]  [address]   [place], [place] [address]   Phone: [phone]  Fax: [phone]    02/10/2010 MRN: [id number]  [facility] [address] [place], [place]  [address]   Dear [name],   Our records indicate that you missed your scheduled appointment with Sandford Craze  on 02-10-2010.  Please contact this office to reschedule your appointment as soon as possible.  It is important that you keep your scheduled appointments with your physician, so we can provide you the best care possible.  Please be advised that there may be a charge for "no show" appointments.    Sincerely,   [place] at [facility]

## 2010-05-23 NOTE — Miscellaneous (Signed)
  Clinical Lists Changes  Problems: Added new problem of VITAMIN D DEFICIENCY (ICD-268.9) Added new problem of HERPES SIMPLEX INFECTION, TYPE I (ICD-054.9) Added new problem of GENITAL HERPES (ICD-054.10) Removed problem of UNSPECIFIED ORTHOPEDIC AFTERCARE (ICD-V54.9) Changed problem from GERD (ICD-530.81) to GERD (ICD-530.81) - EGD 09/11/09- Gastropathy

## 2010-05-23 NOTE — Assessment & Plan Note (Signed)
Summary: not better / tf,cma--Rm 5   Vital Signs:  Patient profile:   30 year old female Height:      68.75 inches Weight:      312.50 pounds BMI:     46.65 O2 Sat:      99 % on Room air Temp:     98.7 degrees F oral Pulse rate:   84 / minute Pulse rhythm:   regular Resp:     18 per minute BP sitting:   110 / 80  (right arm) Cuff size:   large  Vitals Entered By: Mervin Kung CMA Duncan Dull) (January 10, 2010 4:05 PM)  O2 Flow:  Room air CC: Rm 5  Still has cough.  Is Patient Diabetic? No Comments Insurance would not cover [employer]; did not pick up rx. Nicki Guadalajara Fergerson CMA Duncan Dull)  January 10, 2010 4:11 PM    Primary Care Provider:  Lemont Fillers FNP  CC:  Rm 5  Still has cough. .  History of Present Illness: Ms Jessica Roberson is a 30 year old female who presents today for follow up.  She was seen on 9/12 with complaints of myalgias, anorexia, cough, nausea, and headache.  She was managed conservatively for presumed viral illness.  Today she reports that  her appetite is better, myalgias have improved, throat is not as sore as before.  Denies fevers.  She continues to have a nagging, dry cough which  is worse at night, nose is "runny" but not draining.  Reports that her insurance would not cover tessalon for her cough.  Problems Prior to Update: 1)  Cough  (ICD-786.2) 2)  Pharyngitis, Viral, Acute  (ICD-462) 3)  Hypersomnia, Associated With Sleep Apnea  (ICD-780.53) 4)  Hyperglycemia  (ICD-790.29) 5)  Genital Herpes  (ICD-054.10) 6)  Herpes Simplex Infection, Type I  (ICD-054.9) 7)  Vitamin D Deficiency  (ICD-268.9) 8)  Gerd  (ICD-530.81) 9)  Gallstone Pancreatitis  (ICD-577.9) 10)  Depression, Mild  (ICD-311) 11)  Multiple Sclerosis  (ICD-340) 12)  Migraines, Hx of  (ICD-V13.8) 13)  Fatigue  (ICD-780.79) 14)  Morbid Obesity  (ICD-278.01) 15)  Cholecystectomy, Laparoscopic, Hx of  (ICD-V45.79) 16)  Personal Hx Colonic Polyps  (ICD-V12.72) 17)  Abdominal Pain  -generalized  (ICD-789.07) 18)  Nausea  (ICD-787.02) 19)  Abdominal Pain, Left Lower Quadrant  (ICD-789.04)  Allergies: 1)  ! Aspirin 2)  ! Penicillin 3)  ! * Dilaudid 4)  ! Tramadol Hcl  Review of Systems       see HPI  Physical Exam  General:  Well-developed,well-nourished,in no acute distress; alert,appropriate and cooperative throughout examination Ears:  External ear exam shows no significant lesions or deformities.  Otoscopic examination reveals clear canals, tympanic membranes are intact bilaterally without bulging, retraction, inflammation or discharge. Hearing is grossly normal bilaterally. Mouth:  Oral mucosa and oropharynx without lesions or exudates.  Teeth in good repair. Neck:  No deformities, masses, or tenderness noted. Lungs:  Normal respiratory effort, chest expands symmetrically. Lungs are clear to auscultation, no crackles or wheezes. Lung sounds are diminished at the bases.   Heart:  Normal rate and regular rhythm. S1 and S2 normal without gallop, murmur, click, rub or other extra sounds.   Impression & Recommendations:  Problem # 1:  COUGH (ICD-786.2) Assessment Unchanged Will plan to treat empirically for bronchitis.  Will send for CXR.  I am suspicious that she may also have a post-nasal drip.  Start claritin once daily.  Delsym as needed cough.  Orders: CXR- 2view (CXR)  Complete Medication List: 1)  Cyclobenzaprine Hcl 10 Mg Tabs (Cyclobenzaprine hcl) .... Two times a day 2)  Nortriptyline Hcl 25 Mg Caps (Nortriptyline hcl) .... One tab by mouth at bedtime 3)  Valacyclovir Hcl 500 Mg Tabs (Valacyclovir hcl) .... Two times a day 4)  Gilenya 0.5 Mg Caps (Fingolimod hcl) .... Once daily 5)  Benadryl 25 Mg Caps (Diphenhydramine hcl) .... As needed. 6)  Nexium 40 Mg Cpdr (Esomeprazole magnesium) .... One tablet by mouth daily 7)  Amantadine Hcl 100 Mg Caps (Amantadine hcl) .Marland Kitchen.. 1 two times a day 8)  Zithromax Z-pak 250 Mg Tabs (Azithromycin) .... 2 tabs by  mouth today, then one tablet by mouth daily x 4 more days. 9)  Claritin 10 Mg Tabs (Loratadine) .... One tablet by mouth daily 10)  Delsym 30 Mg/17ml Lqcr (Dextromethorphan polistirex) .... 2 teaspoons every 12 hours as needed for cough  Patient Instructions: 1)  Complete your chest x-ray downstairs on the first floor.   2)  We will call you with the results. 3)  Call if your symptoms are not resolved in 1 week.   Prescriptions: ZITHROMAX Z-PAK 250 MG TABS (AZITHROMYCIN) 2 tabs by mouth today, then one tablet by mouth daily x 4 more days.  #1 pack x 0   Entered and Authorized by:   Lemont Fillers FNP   Signed by:   Lemont Fillers FNP on 01/10/2010   Method used:   Electronically to        [id number] (retail)       [address]       [place], [place]  [address]       Ph: [phone] or [phone]       Fax: [phone]   RxID:   [id number]   Current Allergies (reviewed today): ! ASPIRIN ! PENICILLIN ! * DILAUDID ! TRAMADOL HCL

## 2010-05-23 NOTE — Progress Notes (Signed)
Summary: request for records faxed to [facility]   Phone Note Outgoing Call   Call placed by: Marj Call placed to: [facility]  Summary of Call: request for medical records faxed to [facility]  Initial call taken by: Roselle Locus,  October 31, 2009 10:52 AM

## 2010-05-23 NOTE — Assessment & Plan Note (Signed)
Summary: difficulty swallowing / tf,cma--Rm 4   Vital Signs:  Patient profile:   30 year old female Height:      68.75 inches Weight:      318.25 pounds BMI:     47.51 Temp:     98.7 degrees F oral Pulse rate:   90 / minute Pulse rhythm:   regular Resp:     18 per minute BP sitting:   120 / 78  (right arm) Cuff size:   thigh  Vitals Entered By: Mervin Kung CMA Duncan Dull) (November 08, 2009 10:52 AM) CC: Room 4  Pt states she had episode of throat, chest and abdomen burning after taking Cyclobenzaprine and Nortriptyline. Feels like there is a lump in her throat and throat burns when she swallows. Is Patient Diabetic? No   Primary Care Provider:  Lemont Fillers FNP  CC:  Room 4  Pt states she had episode of throat and chest and abdomen burning after taking Cyclobenzaprine and Nortriptyline. 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  She was noted to have a normal esophagus on the endoscopy performed in June. She tried to drink water to push them down.  This was followed by a burning sensation in her throat which was worse with laying flat.  Still has sensation of "lump in throat." She completed the reflux med (nexium) on July 1st.    Allergies: 1)  ! Aspirin 2)  ! Penicillin 3)  ! * Dilaudid 4)  ! Tramadol Hcl  Past History:  Past Medical History: Last updated: 10/31/2009 Migraine Headaches Multiple Sclerosis Anal Fissure Arthritis Obesity Depression Ulcer--09-2009 Allergies Colon polyp--2005  Past Surgical History: Last updated: 10/31/2009 C- Section Knee Arthroscopy x3 Foot Surgery x2 Cholecystectomy--2007 Hernia Surgery--1984 colonoscopy--2005 Mallet Toe correction--2011 Tendon  Transfer--2004  Family History: Last updated: 10/31/2009 Family History of Diabetes: parents, maternal grandmother, paternal grandfather  Hypertension: parents, maternal grandmother Stroke: mother Family History of Ovarian Cancer:Mother Family History of Pancreatic Cancer:Mothrt Family History of Stomach Cancer:Mother Family History of Uterine Cancer:Mother Family History of Heart Disease: Mother Family History of Kidney Disease:Mother, maternal grandmother Breast Cancer:  paternal and maternal aunts Depression: mother   Mom- living, thyroid cancer, Jessica, ESRD, liver/stomach/uterine cancer, multiple myeloma Dad- Living, sarcoidosis, HTN, DM2 Daughter age 44- healthy (asthma) Only child  Social History: Last updated: 10/31/2009 Patient has never smoked Alcohol Use - yes occasional (once a week) Illicit Drug Use - no 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   Regular exercise-yes Single- lives with Boyfriend.    Risk Factors: Alcohol Use: <1 (10/31/2009) Caffeine Use: 2-3 cans soda weekly (10/31/2009) Exercise: yes (10/31/2009)  Risk Factors: Smoking Status: never (10/31/2009)  Physical Exam  General:  Morbidly obese AA female in NAD Lungs:  Normal respiratory effort, chest expands symmetrically. Lungs are clear to auscultation, no crackles or wheezes. Heart:  Normal rate and regular rhythm. S1 and S2 normal without gallop, murmur, click, rub or other extra sounds. Abdomen:  Bowel sounds positive,abdomen soft and non-tender without masses, organomegaly or hernias noted.   Impression & Recommendations:  Problem # 1:  GERD (ICD-530.81) Assessment Deteriorated Her nexium was discontinued in early July at the recommendation  of Dr. Jarold Motto.  Since that time the patient's GERD symptoms seem to have deteriorated.  Will resume once daily. Her updated medication list for this problem includes:    Nexium 40 Mg Cpdr  (Esomeprazole magnesium) ..... One tablet by mouth daily  Complete Medication List: 1)  Cyclobenzaprine Hcl 10 Mg Tabs (Cyclobenzaprine hcl) .... Two times a day 2)  Nortriptyline Hcl 25 Mg Caps (Nortriptyline hcl) .... One tab by mouth at bedtime 3)  Valacyclovir Hcl 500 Mg Tabs (Valacyclovir hcl) .... Two times a day 4)  Gilenya 0.5 Mg Caps (Fingolimod hcl) .... Once daily 5)  Benadryl 25 Mg Caps (Diphenhydramine hcl) .... As needed. 6)  Nexium 40 Mg Cpdr (Esomeprazole magnesium) .... One tablet by mouth daily  Patient Instructions: 1)  Call if symptoms worsen or do not improve. Prescriptions: NEXIUM 40 MG CPDR (ESOMEPRAZOLE MAGNESIUM) one tablet by mouth daily  #30 x 5   Entered and Authorized by:   Lemont Fillers FNP   Signed by:   Lemont Fillers FNP on 11/08/2009   Method used:   Electronically to        [id number]       [address]       [place], [place]  [address]       Ph: [phone] or [phone]       Fax: [phone]   RxID:   [id number]   Current Allergies (reviewed today): ! ASPIRIN ! PENICILLIN ! * DILAUDID ! TRAMADOL HCL

## 2010-05-23 NOTE — Progress Notes (Signed)
Summary: CT Scan denied.  Phone Note Outgoing Call   Call placed by: Ashok Cordia RN,  September 29, 2009 3:33 PM Summary of Call: Medicaid has denied CT Scan.  Pt notified and CT cancelled.  States she feels better on the carafate.  Instructed pt to contunue carafate and report back next week.  Any other instructions? Initial call taken by: Ashok Cordia RN,  September 29, 2009 3:34 PM  Follow-up for Phone Call        needs egd to check for H.pylori... Follow-up by: Mardella Layman MD FACG,  September 30, 2009 8:43 AM  Additional Follow-up for Phone Call Additional follow up Details #1::        Lm for pt to call.  Lupita Leash Surface RN  September 30, 2009 2:52 PM  Talked with pt.  Appt for previsit ant EGD scheduled. Additional Follow-up by: Ashok Cordia RN,  September 30, 2009 3:37 PM

## 2010-05-23 NOTE — Miscellaneous (Signed)
Summary: [facility]  Clinical Lists Changes  Allergies: Added new allergy or adverse reaction of TRAMADOL HCL

## 2010-05-23 NOTE — Assessment & Plan Note (Signed)
Summary: sinus headaches x 2 weeks/dt   Vital Signs:  Patient profile:   30 year old female Height:      68.75 inches Weight:      311.25 pounds BMI:     46.47 O2 Sat:      100 % on Room air Temp:     98.3 degrees F oral Pulse rate:   88 / minute Resp:     18 per minute  Vitals Entered By: Glendell Docker CMA (March 03, 2010 8:48 AM)  O2 Flow:  Room air CC: Headache Is Patient Diabetic? No Pain Assessment Patient in pain? no      Comments c/o headache for the past 2 weeks, several over the counter medication taken with no relief   Primary Care Maven Rosander:  Lemont Fillers FNP  CC:  Headache.  History of Present Illness: Ms Jessica Roberson is a 30 year old female who presents with complaint of sinus HA x 2 weeks.  Pain in cheeks,  Headache- frontal.  Has tried multiple OTC preps and neti pot.  Denies fever  + clear nasal discharge, but has trouble blowing nose.  Denies ear pain ( notes +itching) denies sore throat.  Denies cough.    Preventive Screening-Counseling & Management  Alcohol-Tobacco     Smoking Status: never  Allergies: 1)  ! Aspirin 2)  ! Penicillin 3)  ! * Dilaudid 4)  ! Tramadol Hcl  Past History:  Past Medical History: Last updated: 12/20/2009 Migraine Headaches Multiple Sclerosis -2004 Anal Fissure Arthritis Obesity Depression Ulcer--09-2009 Allergies Colon polyp--2005  Past Surgical History: Last updated: 10/31/2009 C- Section Knee Arthroscopy x3 Foot Surgery x2 Cholecystectomy--2007 Hernia Surgery--1984 colonoscopy--2005 Mallet Toe correction--2011 Tendon Transfer--2004  Review of Systems       see HPI  Physical Exam  General:  Well-developed,well-nourished,in no acute distress; alert,appropriate and cooperative throughout examination Head:  Normocephalic and atraumatic without obvious abnormalities. No apparent alopecia or balding. + maxillary and frontal sinus tenderness to palpation. Ears:  External ear exam shows no  significant lesions or deformities.  Otoscopic examination reveals clear canals, tympanic membranes are intact bilaterally without bulging, retraction, inflammation or discharge. Hearing is grossly normal bilaterally. Mouth:  Oral mucosa and oropharynx without lesions or exudates.  Teeth in good repair. Neck:  No deformities, masses, or tenderness noted. Lungs:  Normal respiratory effort, chest expands symmetrically. Lungs are clear to auscultation, no crackles or wheezes. Heart:  Normal rate and regular rhythm. S1 and S2 normal without gallop, murmur, click, rub or other extra sounds.   Impression & Recommendations:  Problem # 1:  SINUSITIS, ACUTE (ICD-461.9) Assessment New Will treat with clarithromycin given patient's history of penicillin allergy.  She would like a flu shot today. The following medications were removed from the medication list:    Zithromax Z-pak 250 Mg Tabs (Azithromycin) .Marland Kitchen... 2 tabs by mouth today, then one tablet by mouth daily x 4 more days.    Delsym 30 Mg/35ml Lqcr (Dextromethorphan polistirex) .Marland Kitchen... 2 teaspoons every 12 hours as needed for cough Her updated medication list for this problem includes:    Clarithromycin 500 Mg Tabs (Clarithromycin) ..... One tablet by mouth two times a day x 10 days  Complete Medication List: 1)  Cyclobenzaprine Hcl 10 Mg Tabs (Cyclobenzaprine hcl) .... Two times a day 2)  Nortriptyline Hcl 25 Mg Caps (Nortriptyline hcl) .... One tab by mouth at bedtime 3)  Valacyclovir Hcl 500 Mg Tabs (Valacyclovir hcl) .... Two times a day 4)  Gilenya 0.5  Mg Caps (Fingolimod hcl) .... Once daily 5)  Benadryl 25 Mg Caps (Diphenhydramine hcl) .... As needed. 6)  Nexium 40 Mg Cpdr (Esomeprazole magnesium) .... One tablet by mouth daily 7)  Amantadine Hcl 100 Mg Caps (Amantadine hcl) .Marland Kitchen.. 1 two times a day 8)  Claritin 10 Mg Tabs (Loratadine) .... One tablet by mouth daily 9)  Clarithromycin 500 Mg Tabs (Clarithromycin) .... One tablet by mouth two  times a day x 10 days  Other Orders: Flu Vaccine 14yrs + ([address]109) Admin 1st Vaccine (60454)  Patient Instructions: 1)   Call if you develop fever over 101, increasing sinus pressure, pain with eye movement, increased facial tenderness of swelling, or if you develop visual changes. Prescriptions: CLARITHROMYCIN 500 MG TABS (CLARITHROMYCIN) one tablet by mouth two times a day x 10 days  #20 x 0   Entered and Authorized by:   Lemont Fillers FNP   Signed by:   Lemont Fillers FNP on 03/03/2010   Method used:   Electronically to        [facility]       [address]       [place], [place]  [address]       Ph: [phone] or [phone]       Fax: [phone]   RxID:   [id number]    Orders Added: 1)  Est. Patient Level III [34742] 2)  Flu Vaccine 100yrs + [59563] 3)  Admin 1st Vaccine [90471] 4)  Est. Patient Level III [87564]   Immunizations Administered:  Influenza Vaccine # 1:    Vaccine Type: Fluvax 3+    Site: left deltoid    Mfr: GlaxoSmithKline    Dose: 0.5 ml    Route: IM    Given by: Glendell Docker CMA    Exp. Date: 10/21/2010    Lot #: [id number]    VIS given: 11/15/09 version given March 03, 2010.  Flu Vaccine Consent Questions:    Do you have a history of severe allergic reactions to this vaccine? no    Any prior history of allergic reactions to egg and/or gelatin? no    Do you have a sensitivity to the preservative Thimersol? no    Do you have a past history of Guillan-Barre Syndrome? no    Do you currently have an acute febrile illness? no    Have you ever had a severe reaction to latex? no    Vaccine information given and explained to patient? yes    Are you currently pregnant? no   Immunizations Administered:  Influenza Vaccine # 1:    Vaccine Type: Fluvax 3+    Site: left deltoid    Mfr: GlaxoSmithKline    Dose: 0.5 ml    Route: IM    Given by: Glendell Docker CMA    Exp. Date: 10/21/2010    Lot #: [id number]     VIS given: 11/15/09 version given March 03, 2010.   Current Allergies (reviewed today): ! ASPIRIN ! PENICILLIN ! * DILAUDID ! TRAMADOL HCL   Patient Instructions: 1)   Call if you develop fever over 101, increasing sinus pressure, pain with eye movement, increased facial tenderness of swelling, or if you develop visual changes.    Preventive Care Screening  Pap Smear:    Date:  12/06/2009    Results:  normal

## 2010-05-23 NOTE — Assessment & Plan Note (Signed)
Summary: per dr o'sullivan/snoring/fatigue/mhh   Visit Type:  Initial Consult Primary Provider/Referring Provider:  Lemont Fillers FNP  CC:  Pt here for sleep consult.  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 c/o constant tiredness x 6 mnths, even after 8H of sleep . on amantadine per her neurologist for MS- but this is not helping. Epworth Sleepiness Score 8, no naps bedtime 1130 ,hypervigilant,  latency -15-20 mins, sleeps on her side x 2pillows, oob at 0730, tired,  no dryness, headaches + lasting few hrs,  Works on weekends Boyfriend has TV in bedroom, no snoring, no witnessed apneas,  talks in sleep, violent dreams There is no history suggestive of cataplexy, sleep paralysis or parasomnias   Preventive Screening-Counseling & Management  Alcohol-Tobacco     Alcohol drinks/day: <1     Alcohol type: wine / tequilla     Smoking Status: never  Current Medications (verified): 1)  Cyclobenzaprine Hcl 10 Mg Tabs (Cyclobenzaprine Hcl) .... Two Times A Day 2)  Nortriptyline Hcl 25 Mg Caps (Nortriptyline Hcl) .... One Tab By Mouth At Bedtime 3)  Valacyclovir Hcl 500 Mg Tabs (Valacyclovir Hcl) .... Two Times A Day 4)  Gilenya 0.5 Mg Caps (Fingolimod Hcl) .... Once Daily 5)  Benadryl 25 Mg Caps (Diphenhydramine Hcl) .... As Needed. 6)  Nexium 40 Mg Cpdr (Esomeprazole Magnesium) .... One Tablet By Mouth Daily 7)  Amantadine Hcl 100 Mg Caps (Amantadine Hcl) .Marland Kitchen.. 1 Two Times A Day  Allergies (verified): 1)  ! Aspirin 2)  ! Penicillin 3)  ! * Dilaudid 4)  ! Tramadol Hcl  Past History:  Past Surgical History: Last updated: 10/31/2009 C- Section Knee Arthroscopy x3 Foot Surgery x2 Cholecystectomy--2007 Hernia Surgery--1984 colonoscopy--2005 Mallet Toe correction--2011 Tendon Transfer--2004  Family History: Last updated: 10/31/2009 Family History of Diabetes: parents, maternal grandmother, paternal  grandfather  Hypertension: parents, maternal grandmother Stroke: mother Family History of Ovarian Cancer:Mother Family History of Pancreatic Cancer:Mothrt Family History of Stomach Cancer:Mother Family History of Uterine Cancer:Mother Family History of Heart Disease: Mother Family History of Kidney Disease:Mother, maternal grandmother Breast Cancer:  paternal and maternal aunts Depression: mother   Mom- living, thyroid cancer, MS, ESRD, liver/stomach/uterine cancer, multiple myeloma Dad- Living, sarcoidosis, HTN, DM2 Daughter age 57- healthy (asthma) Only child  Social History: Last updated: 10/31/2009 Patient has never smoked Alcohol Use - yes occasional (once a week) Illicit Drug Use - no 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   Regular exercise-yes Single- lives with Boyfriend.    Past Medical History: Migraine Headaches Multiple Sclerosis -2004 Anal Fissure Arthritis Obesity Depression Ulcer--09-2009 Allergies Colon polyp--2005  Review of Systems       The patient complains of headaches, anxiety, and joint stiffness or pain.  The patient denies shortness of breath with activity, shortness of breath at rest, productive cough, non-productive cough, coughing up blood, chest pain, irregular heartbeats, acid heartburn, indigestion, loss of appetite, weight change, abdominal pain, difficulty swallowing, sore throat, tooth/dental problems, nasal congestion/difficulty breathing through nose, sneezing, itching, ear ache, depression, hand/feet swelling, rash, change in color of mucus, and fever.    Vital Signs:  Patient profile:   30 year old female Height:      68.75 inches Weight:      316 pounds BMI:     47.17 O2 Sat:      99 % on Room air Temp:     98.6  degrees F oral Pulse rate:   70 / minute BP sitting:   132 / 94  (left arm) Cuff size:   large  Vitals Entered By: Zackery Barefoot CMA (December 20, 2009  4:00 PM)  O2 Flow:  Room air CC: Pt here for sleep consult Comments Medications reviewed with patient Verified contact number and pharmacy with patient Zackery Barefoot CMA  December 20, 2009 4:01 PM    Physical Exam  Additional Exam:  Gen. Pleasant, well-nourished, in no distress, normal affect, wt 316 lbs ENT - no lesions, no post nasal drip, class 3 airway Neck: No JVD, no thyromegaly, no carotid bruits Lungs: no use of accessory muscles, no dullness to percussion, clear without rales or rhonchi  Cardiovascular: Rhythm regular, heart sounds  normal, no murmurs or gallops, no peripheral edema Abdomen: soft and non-tender, no hepatosplenomegaly, BS normal. Musculoskeletal: No deformities, no cyanosis or clubbing Neuro:  alert, non focal     Impression & Recommendations:  Problem # 1:  HYPERSOMNIA, ASSOCIATED WITH SLEEP APNEA (ICD-780.53) Given constant tiredness, non refreshing sleep, morbid obesity & narrow pharyngeal exam , obstructive sleep apnea pre test probability is high & overnight PSG will be scheduled as a split study. The pathophysiology of obstructive sleep apnea, it's cardiovascular consequences and modes of treatment including CPAP were discussed with the patient in great detail.  Orders: Sleep Disorder Referral (Sleep Disorder) Consultation Level III (04540)  Problem # 2:  MORBID OBESITY (ICD-278.01) weight loss encouraged  Patient Instructions: 1)  Copy sent [id number] 2)  Please schedule a follow-up appointment in 2 weeks after slepe study

## 2010-05-23 NOTE — Letter (Signed)
Summary: Out of Work  Adult nurse at [employer]. [address]   [place], [place] [address]   Phone: [phone]  Fax: [phone]    January 02, 2010   Employee:  RAMONDA GALYON    To Whom It May Concern:   For Medical reasons, please excuse the above named employee from work for the following dates:  Start:   9/12  End:   9/14  If you need additional information, please feel free to contact our office.         Sincerely,    Lemont Fillers FNP

## 2010-05-23 NOTE — Progress Notes (Signed)
Summary: Cough has gotten worse  Phone Note Call from Patient Call back at Home Phone [phone]   Caller: Patient Reason for Call: Talk to Nurse Complaint: Cough/Sore throat Summary of Call: Pt states cough has gotten worse Initial call taken by: Lannette Donath,  January 09, 2010 4:45 PM  Follow-up for Phone Call        Pt states that her cough is productive about 50% of the time but will not go away.  Doesn't feel cough is worse, but it isn't better. Still has sore throat, feels the same as when she was here at last visit.  Pt wants to know if we can prescribe something to stop the cough. Reports no fever at home. Please advise. Nicki Guadalajara Fergerson CMA Duncan Dull)  January 09, 2010 4:56 PM   Additional Follow-up for Phone Call Additional follow up Details #1::        Tessalon sent to her pharmacy for cough.  Please schedule a follow up apt in the AM for re-evaluation. Additional Follow-up by: Lemont Fillers FNP,  January 09, 2010 5:02 PM    Additional Follow-up for Phone Call Additional follow up Details #2::    Pt notified and appt scheduled for tomorrow with [facility] at 2:30. Nicki Guadalajara Fergerson CMA (AAMA)  January 09, 2010 5:26 PM   New/Updated Medications: TESSALON PERLES 100 MG CAPS (BENZONATATE) one cap by mouth three times a day as needed cough Prescriptions: TESSALON PERLES 100 MG CAPS (BENZONATATE) one cap by mouth three times a day as needed cough  #30 x 0   Entered and Authorized by:   Lemont Fillers FNP   Signed by:   Lemont Fillers FNP on 01/09/2010   Method used:   Electronically to        [id number]       [address]       [place], [place]  [address]       Ph: [phone] or [phone]       Fax: [phone]   RxID:   [id number]

## 2010-05-23 NOTE — Assessment & Plan Note (Signed)
Summary: acid reflux/mhf--Rm 5   Vital Signs:  Patient profile:   30 year old female Height:      68 inches Weight:      314 pounds BMI:     47.92 Temp:     97.9 degrees F oral Pulse rate:   78 / minute Pulse rhythm:   regular Resp:     18 per minute BP sitting:   130 / 96  (left arm) Cuff size:   large  Vitals Entered By: Mervin Kung CMA Duncan Dull) (March 31, 2010 4:11 PM) CC: Pt states she has had increase in GERD symptoms x 3-4 weeks. Has chest burning, raw throat, bloating and gas. Nexium and tums not helping. Is Patient Diabetic? No Pain Assessment Patient in pain? no      Comments Pt states she has increased Nexium 2 two times a day and is taking tums without relief.  Pt has completed Clarithromycin. Had to stop Amantadine due to headaches. Nicki Guadalajara Fergerson CMA Duncan Dull)  March 31, 2010 4:20 PM    Primary Care Provider:  Lemont Fillers FNP  CC:  Pt states she has had increase in GERD symptoms x 3-4 weeks. Has chest burning, raw throat, and bloating and gas. Nexium and tums not helping.Marland Kitchen  History of Present Illness: Patient is a 30 year old female who presents today with complaint of Acid reflux.  1) GERD- Symptoms worsened approximately 4 weeks ago. Symptoms are associated with dysphagia, burping and flatulence.   Discomfort is improved by use of 4 nexium a day, it is not improved with use of Beano. She has history of similar symptoms and had an EGD which showed H. Pylori negative Gastritis.   constant "gas" burping/flatulence.   Notes increased stress, hair loss.  Patient was constipated, now resolved with addition of fiber.  Denies diarrhea, melena or hematochezia.  History of lactose intolerance.   2) Anxiety- several times a week, panic attacks.  Picking her feet- does for hours, insead of sleeping.  Will pick her feet until they bleed.  Allergies: 1)  ! Aspirin 2)  ! Penicillin 3)  ! * Dilaudid 4)  ! 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  [employer] Regular exercise-yes Single- lives with Boyfriend.    Physical Exam  General:  Well-developed,well-nourished,in no acute distress; alert,appropriate and cooperative throughout examination Lungs:  Normal respiratory effort, chest expands symmetrically. Lungs are clear to auscultation, no crackles or wheezes. Heart:  Normal rate and regular rhythm. S1 and S2 normal without gallop, murmur, click, rub or other extra sounds. Extremities:  No edema Psych:  Oriented X3, memory intact for recent and remote, and normally interactive.     Impression & Recommendations:  Problem # 1:  ANXIETY (ICD-300.00) Assessment New Will initiate citalopram.  I suspect that she may also have an OCD component given her "foot picking"  She feels that if she wears sock to prevent picking, she will eat instead.  25 minutes were spent with the patient.  Greater than  50% of this time was spent counseling patient on her anxiety.   Her updated medication list for this problem includes:    Nortriptyline Hcl 25 Mg Caps (Nortriptyline hcl) ..... One tab by mouth at bedtime    Citalopram Hydrobromide 20 Mg Tabs (Citalopram hydrobromide) ..... One tablet by mouth by mouth once daily  Problem # 2:  GERD (ICD-530.81) Assessment: Deteriorated Trial of Zegerid.  Also think that some of her  Flatulence/burping could be related to lactose intolerance.  Reflux measures were reviewed with pt and hand out was provided.  Instructed patient to avoid dairy products. Her updated medication list for this problem includes:    Zegerid 40-1100 Mg Caps (Omeprazole-sodium bicarbonate) ..... One cap by mouth daily  Complete Medication List: 1)  Cyclobenzaprine Hcl 10 Mg Tabs (Cyclobenzaprine hcl) .... Two times a day 2)  Nortriptyline Hcl 25 Mg Caps (Nortriptyline hcl) .... One tab by mouth at bedtime 3)  Valacyclovir Hcl 500 Mg Tabs (Valacyclovir hcl) .... Two times a day 4)  Gilenya 0.5 Mg Caps (Fingolimod hcl) .... Once daily 5)  Benadryl 25 Mg Caps (Diphenhydramine hcl) .... As needed. 6)  Zegerid 40-1100 Mg Caps (Omeprazole-sodium bicarbonate) .... One cap by mouth daily 7)  Amantadine Hcl 100 Mg Caps (Amantadine hcl) .Marland Kitchen.. 1 two times a day 8)  Claritin 10 Mg Tabs (Loratadine) .... One tablet by mouth daily 9)  Citalopram Hydrobromide 20 Mg Tabs (Citalopram hydrobromide) .... One tablet by mouth by mouth once daily  Patient Instructions: 1)  Avoid dairy products. 2)  Citalopram- Please start 1/2 tablet one a day for one week, then increase to a full tablet. 3)  It will likely take several weeks before you will notice improvement. 4)  Side effects of this medicine may include drowsiness or nausea.  If this becomes an issue for you call for further instructions. 5)  Very rarely people may develop suicidal thoughts when taking these types of medicines- should this happen to you, discontinue medication and go directly to the emergency room. 6)  Please arrange a follow up appointment in 1 month  Prescriptions: CITALOPRAM HYDROBROMIDE 20 MG TABS (CITALOPRAM HYDROBROMIDE) one tablet by mouth by mouth once daily  #30 x 1   Entered and Authorized by:   Lemont Fillers FNP   Signed by:   Lemont Fillers FNP on 03/31/2010   Method used:   Electron[facility] to        [id number]       [address]       [place], [place]  [address]       Ph: [phone] or [phone]       Fax: [phone]   RxID:   [id number] ZEGERID 40-1100 MG CAPS (OMEPRAZOLE-SODIUM BICARBONATE) one cap by mouth daily  #30 x 2   Entered and Authorized by:   Lemont Fillers FNP   Signed by:   Lemont Fillers FNP on 03/31/2010   Method used:   Electronically to        [id number]       [address]       [place], [place]  [address]       Ph: [phone] or [phone]       Fax: [phone]   RxID:   [id number]    Orders Added: 1)  Est. Patient Level IV [06[address]]    Current Allergies (reviewed today): ! ASPIRIN ! PENICILLIN ! * DILAUDID ! TRAMADOL HCL

## 2010-05-23 NOTE — Miscellaneous (Signed)
Summary: Orders Update Clotest  Clinical Lists Changes  Orders: Added new Test order of TLB-H Pylori Screen Gastric Biopsy (83013-CLOTEST) - Signed 

## 2010-05-23 NOTE — Letter (Signed)
Summary: New Patient letter  [facility]  [address] [place], [place] [address]   Phone: [phone]  Fax: [phone]       09/13/2009 MRN: [id number]  [facility] [address] [place], [place]  [address]  Dear [name],  Welcome to the Gastroenterology Division at [facility].    You are scheduled to see Dr. Jarold Motto on 09/27/2009 at 2:45PM on the 3rd floor at [facility], [address]  We ask that you try to arrive at our office 15 minutes prior to your appointment time to allow for check-in.  We would like you to complete the enclosed self-administered evaluation form prior to your visit and bring it with you on the day of your appointment.  We will review it with you.  Also, please bring a complete list of all your medications or, if you prefer, bring the medication bottles and we will list them.  Please bring your insurance card so that we may make a copy of it.  If your insurance requires a referral to see a specialist, please bring your referral form from your primary care physician.  Co-payments are due at the time of your visit and may be paid by cash, check or credit card.     Your office visit will consist of a consult with your physician (includes a physical exam), any laboratory testing he/she may order, scheduling of any necessary diagnostic testing (e.g. x-ray, ultrasound, CT-scan), and scheduling of a procedure (e.g. Endoscopy, Colonoscopy) if required.  Please allow enough time on your schedule to allow for any/all of these possibilities.    If you cannot keep your appointment, please call [phone] to cancel or reschedule prior to your appointment date.  This allows [place] the opportunity to schedule an appointment for another patient in need of care.  If you do not cancel or reschedule by 5 p.m. the business day prior to your appointment date, you will be charged a $50.00 late cancellation/no-show fee.    Thank you for  choosing [facility] for your medical needs.  We appreciate the opportunity to care for you.  Please visit [place] at our website  to learn more about our practice.                     Sincerely,                                                             The Gastroenterology Division

## 2010-05-23 NOTE — Letter (Signed)
   [facility] [address] [place], [place]  [address]  [place] Phone: [phone]      December 12, 2009   [facility] [address] [place], [place] [address]  RE:  LAB RESULTS  Dear  [name],  The following is an interpretation of your most recent lab tests.  Please take note of any instructions provided or changes to medications that have resulted from your lab work.   DIABETIC STUDIES:  Good - no changes needed Blood Glucose: 100   HgbA1C: 5.7    Your A1C- diabetic test was normal, but at the upper limits of normal.  We will continue to keep a watch on this.  In the meantime, keep working hard on diet, exercise, and weight loss.    Sincerely Yours,    Lemont Fillers FNP

## 2010-05-23 NOTE — Assessment & Plan Note (Signed)
Summary: 1 month follow up/mhf   Vital Signs:  Patient profile:   30 year old female Height:      68.75 inches (174.63 cm) Weight:      316.50 pounds (143.86 kg) BMI:     47.25 Temp:     98.1 degrees F (36.72 degrees C) oral Pulse rate:   78 / minute Pulse rhythm:   regular BP sitting:   128 / 86  (right arm) Cuff size:   thigh  Vitals Entered By: Brenton Grills MA (December 09, 2009 8:16 AM) CC: 1 month F/U/aj   Primary Care Provider:  Lemont Fillers FNP  CC:  1 month F/U/aj.  History of Present Illness: Ms Jessica Roberson is a 30 year old female who presents today for follow up of her GERD.  Now back on nexium, esophageal discomfort has resolved, feeling much better, wishes to continue the GERD.  Fatigue-  7-9 hours of sleep.  + snoring.    Notes that her fatigue started several months ago, but has gotten worse recently.  Started a part time job 4 hours a day.  Now on amantadine per her neurologist- but this is not helping.  Notes that she has had a poor appetite the last few weeks.  Eating mainly fruit/trail mix.    Current Medications (verified): 1)  Cyclobenzaprine Hcl 10 Mg Tabs (Cyclobenzaprine Hcl) .... Two Times A Day 2)  Nortriptyline Hcl 25 Mg Caps (Nortriptyline Hcl) .... One Tab By Mouth At Bedtime 3)  Valacyclovir Hcl 500 Mg Tabs (Valacyclovir Hcl) .... Two Times A Day 4)  Gilenya 0.5 Mg Caps (Fingolimod Hcl) .... Once Daily 5)  Benadryl 25 Mg Caps (Diphenhydramine Hcl) .... As Needed. 6)  Nexium 40 Mg Cpdr (Esomeprazole Magnesium) .... One Tablet By Mouth Daily 7)  Amantadine Hcl 100 Mg Caps (Amantadine Hcl) .Marland Kitchen.. 1 Two Times A Day  Allergies (verified): 1)  ! Aspirin 2)  ! Penicillin 3)  ! * Dilaudid 4)  ! Tramadol Hcl  Review of Systems       see HPI  Physical Exam  General:  Morbidly obese AA female, awake, alert and in NAD Head:  Normocephalic and atraumatic without obvious abnormalities. No apparent alopecia or balding. Lungs:  Normal respiratory  effort, chest expands symmetrically. Lungs are clear to auscultation, no crackles or wheezes. Heart:  Normal rate and regular rhythm. S1 and S2 normal without gallop, murmur, click, rub or other extra sounds.   Impression & Recommendations:  Problem # 1:  HYPERGLYCEMIA (ICD-790.29) Assessment New Mild hyperglycemia noted on her lab work.  Will check A1C.  Hirsuit- PCOS is a consideration. Orders: T-Hgb A1C ([id number])  Problem # 2:  FATIGUE (ICD-780.79) Assessment: Deteriorated  No improvement thus far with amantadine.  MS may be playing a role in her fatigue.  However patient is morbidly obese and reports that she snores at night.  Will send for sleep study to rule out OHS/OSA as this may be a contributing factor.  Pt was counseled on healthy diet and encouraged to make sure that she is getting   Orders: Sleep Disorder Referral (Sleep Disorder)  Problem # 3:  GERD (ICD-530.81) Assessment: Improved Symptoms improved, will continue Nexium.  Her updated medication list for this problem includes:    Nexium 40 Mg Cpdr (Esomeprazole magnesium) ..... One tablet by mouth daily  Complete Medication List: 1)  Cyclobenzaprine Hcl 10 Mg Tabs (Cyclobenzaprine hcl) .... Two times a day 2)  Nortriptyline Hcl 25 Mg Caps (Nortriptyline hcl) .Marland KitchenMarland KitchenMarland Kitchen  One tab by mouth at bedtime 3)  Valacyclovir Hcl 500 Mg Tabs (Valacyclovir hcl) .... Two times a day 4)  Gilenya 0.5 Mg Caps (Fingolimod hcl) .... Once daily 5)  Benadryl 25 Mg Caps (Diphenhydramine hcl) .... As needed. 6)  Nexium 40 Mg Cpdr (Esomeprazole magnesium) .... One tablet by mouth daily 7)  Amantadine Hcl 100 Mg Caps (Amantadine hcl) .Marland Kitchen.. 1 two times a day  Patient Instructions: 1)  You will be contacted about your sleep study. 2)  Please complete your lab work downstairs today. 3)  Follow up in 2 months.

## 2010-05-23 NOTE — Progress Notes (Signed)
Summary: needs appt  Phone Note Call from Patient Call back at Home Phone [phone]   Caller: Patient Call For: osullivan Summary of Call: she has been taking nexium 1 pill per day for several months.  last week she went up to 2 pills per day and that was helping.  Now the 2 pills is not working.  She feels gassy full and has indigestion.  Please advise what to do  Initial call taken by: Roselle Locus,  March 28, 2010 11:51 AM  Follow-up for Phone Call        Please have patient schedule OV for further evaluation. Follow-up by: Lemont Fillers FNP,  March 28, 2010 3:02 PM  Additional Follow-up for Phone Call Additional follow up Details #1::        Left message on machine to return my call. Nicki Guadalajara Fergerson CMA Duncan Dull)  March 28, 2010 4:13 PM     Additional Follow-up for Phone Call Additional follow up Details #2::    Pt returned my call and scheduled appt for 03/31/10 at 4pm. Mervin Kung CMA Duncan Dull)  March 28, 2010 4:34 PM

## 2010-05-23 NOTE — Assessment & Plan Note (Signed)
Summary: new pt est care/dt--Rm 4   Vital Signs:  Patient profile:   30 year old female Height:      68.75 inches Weight:      315 pounds BMI:     47.03 Temp:     97.9 degrees F oral Pulse rate:   78 / minute Pulse rhythm:   regular Resp:     18 per minute BP sitting:   120 / 88  (right arm) Cuff size:   thigh  Vitals Entered By: Mervin Kung CMA Duncan Dull) (October 31, 2009 10:25 AM) CC: Room 4   New to establish care.  Feels tired all the time x 7-8 months. Sleeps 8-9 hours daily. Is Patient Diabetic? No Comments Pt has completed:  Nexium, Percocet, Carafate and Promethazine.   Primary Care Provider:  Elizabeth Palau, PA  CC:  Room 4   New to establish care.  Feels tired all the time x 7-8 months. Sleeps 8-9 hours daily.Marland Kitchen  History of Present Illness: Ms Jessica Roberson is a 30 year old female who presents today to establish care.   1) Fatigue- Notes that she is very fatigued x 7-8 months.   Notes that she gets 8-9 hours of sleep, and occasionally naps.  Wakes up tired.  + Snoring.  Denies fevers, c/o hirsuitism (neck) new.   2)Migraines- well controlled, last migraine was several months ago (uses nortriptylline and as needed imitrex)  3)MS- Dr. Epimenio Foot,  on [place]- asymptomatic.  4)Depression-  started after her daughter was born.  Was treated for about 6 months.  Notes mood is good.  Anxious- by nature.    5)Ulcer NSAID induced- sees Dr. Jarold Motto at [facility], currently asymptomatic- off NSAIDS, not on PPI.  Denies stomach pain, denies black/bloody stools.  EGD performed 6/22 by Dr. Jarold Motto noted gastropathy.  6)Obesity- reports good diet, regular exercise, no longer losing weight.    Preventive Screening-Counseling & Management  Alcohol-Tobacco     Alcohol drinks/day: <1     Alcohol type: wine / tequilla     Smoking Status: never  Caffeine-Diet-Exercise     Caffeine use/day: 2-3 cans soda weekly     Does Patient Exercise: yes     Type of exercise: walking, weights, cardio    Exercise (avg: min/session): 30-60     Times/week: 3  Allergies: 1)  ! Aspirin 2)  ! Penicillin 3)  ! * Dilaudid 4)  ! Tramadol Hcl  Past History:  Past Medical History: Migraine Headaches Multiple Sclerosis Anal Fissure Arthritis Obesity Depression Ulcer--09-2009 Allergies Colon polyp--2005  Past Surgical History: C- Section Knee Arthroscopy x3 Foot Surgery x2 Cholecystectomy--2007 Hernia Surgery--1984 colonoscopy--2005 Mallet Toe correction--2011 Tendon Transfer--2004  Family History: Family History of Diabetes: parents, maternal grandmother, paternal grandfather  Hypertension: parents, maternal grandmother Stroke: mother Family History of Ovarian Cancer:Mother Family History of Pancreatic Cancer:Mothrt Family History of Stomach Cancer:Mother Family History of Uterine Cancer:Mother Family History of Heart Disease: Mother Family History of Kidney Disease:Mother, maternal grandmother Breast Cancer:  paternal and maternal aunts Depression: mother   Mom- living, thyroid cancer, MS, ESRD, liver/stomach/uterine cancer, multiple myeloma Dad- Living, sarcoidosis, HTN, DM2 Daughter age 55- healthy (asthma) Only child  Social History: Patient has never smoked Alcohol Use - yes occasional (once a week) Illicit Drug Use - no 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   Regular exercise-yes Single- lives with Boyfriend.  Caffeine use/day:  2-3 cans soda weekly Does Patient Exercise:  yes  Review of  Systems       see HPI  Physical Exam  General:  Morbidly obese AA female in NAD Head:  Normocephalic and atraumatic without obvious abnormalities. No apparent alopecia or balding. Neck:  No deformities, masses, or tenderness noted. Lungs:  Normal respiratory effort, chest expands symmetrically. Lungs are clear to auscultation, no crackles or wheezes. Heart:  Normal rate and regular rhythm. S1 and S2 normal  without gallop, murmur, click, rub or other extra sounds. Extremities:  No clubbing, cyanosis, edema, or deformity noted with normal full range of motion of all joints.   Skin:  + hair noted on neck. Psych:  Cognition and judgment appear intact. Alert and cooperative with normal attention span and concentration. No apparent delusions, illusions, hallucinations   Impression & Recommendations:  Problem # 1:  FATIGUE (ICD-780.79) Assessment New Will check initial labs.  If w/u negative, may want to consider sleep study.  MS is another potential cause for her fatigue. Orders: T-Comprehensive Metabolic Panel [id number]) T-CBC No Diff ([id number]) T-TSH ([id number]) T-HIV-1 (Screen) ([id number])  Problem # 2:  MIGRAINES, HX OF (ICD-V13.8) Assessment: Improved Migraines are stable- continue nortriptylline per neurology.  Problem # 3:  MULTIPLE SCLEROSIS (ICD-340) Assessment: Comment Only Stable, pt is currently asymptomatic- followed by neurology  Problem # 4:  DEPRESSION, MILD (ICD-311) Assessment: Improved Depression is currently stable.  Monitor Her updated medication list for this problem includes:    Nortriptyline Hcl 50 Mg Caps (Nortriptyline hcl) .Marland Kitchen... At bedtime  Problem # 5:  MORBID OBESITY (ICD-278.01) Assessment: Comment Only Discussed diet exercise and weight loss.  Complete Medication List: 1)  Cyclobenzaprine Hcl 10 Mg Tabs (Cyclobenzaprine hcl) .... Two times a day 2)  Nortriptyline Hcl 50 Mg Caps (Nortriptyline hcl) .... At bedtime 3)  Valacyclovir Hcl 500 Mg Tabs (Valacyclovir hcl) .... Two times a day 4)  Gilenya 0.5 Mg Caps (Fingolimod hcl) .... Once daily 5)  Benadryl 25 Mg Caps (Diphenhydramine hcl) .... As needed.  Patient Instructions: 1)  Try to limit your sodium intake. 2)  Please return fasting in 1 month for a complete physical.     Preventive Care Screening  Pap Smear:    Date:  10/21/2008    Results:  normal   PPD:    Date:  07/23/2006     Results:  negative    Current Allergies (reviewed today): ! ASPIRIN ! PENICILLIN ! * DILAUDID ! TRAMADOL HCL

## 2010-05-23 NOTE — Progress Notes (Signed)
Summary: alternate rx   Phone Note From Pharmacy   [facility] Call For: "Osullivan   Summary of Call: The Clarithymican is out of stock at this pharmacy.  Please send alternate rx  Initial call taken by: Roselle Locus,  March 03, 2010 9:52 AM  Follow-up for Phone Call        Spoke to patient, she requests that rx be sent to [facility] on [address]   [facility] Follow-up by: Lemont Fillers FNP,  March 03, 2010 10:10 AM    Prescriptions: CLARITHROMYCIN 500 MG TABS (CLARITHROMYCIN) one tablet by mouth two times a day x 10 days  #20 x 0   Entered and Authorized by:   Lemont Fillers FNP   Signed by:   Lemont Fillers FNP on 03/03/2010   Method used:   Electronically to        [facility] [phone]* (retail)       [address]       [place], [place]  [address]       Ph: [phone]       Fax: [phone]   RxID:   [id number]

## 2010-05-25 NOTE — Medication Information (Signed)
Summary: Prior Authorization & Approval for Omeprazole/[place] Medicaid  Prior Authorization & Approval for Omeprazole/[place] Medicaid   Imported By: Lanelle Bal 04/21/2010 07:27:11  _____________________________________________________________________  External Attachment:    Type:   Image     Comment:   External Document

## 2010-05-25 NOTE — Progress Notes (Signed)
Summary: wants referral  Phone Note Call from Patient Call back at [phone]   Caller: Patient Call For: Lemont Fillers FNP Summary of Call: Has taken Citalopram x 2 weeks. Doesn't feel symptoms are improving. Pt is aware it may take several weeks to notice improvement. Finds herself worrying if the medicine is helping or not. Barely slept last night. States she has a lot of anxiety and wants referral to a counselor. She saw someone in [facility] 3 years ago and felt this might help again. Will see whomever you recommend. Please advise. Nicki Guadalajara Fergerson CMA Duncan Dull)  April 11, 2010 11:58 AM   Follow-up for Phone Call        Will arrange, pt notified.  She wishes to try a different therapist.  Follow-up by: Lemont Fillers FNP,  April 11, 2010 12:04 PM

## 2010-05-25 NOTE — Assessment & Plan Note (Signed)
Summary: 1 month follow --rm 5   Vital Signs:  Patient profile:   30 year old female Height:      68 inches Weight:      310.75 pounds BMI:     47.42 Temp:     98.0 degrees F oral Pulse rate:   78 / minute Pulse rhythm:   regular Resp:     18 per minute BP sitting:   128 / 98  (right arm) Cuff size:   large  Vitals Entered By: Mervin Kung CMA Duncan Dull) (May 12, 2010 1:14 PM) CC: Pt here for 1 month follow up. States she is having mood swings, more anxiety. Stopped Citalopram x 1 week but symptoms continue. Has also had facial hair x months but feels it is worse recently., Depression Is Patient Diabetic? No Pain Assessment Patient in pain? no      Comments Pt has stopped Citalopram, cyclobenzaprine is once a day now and valacyclovir is as needed. Nicki Guadalajara Fergerson CMA (AAMA)  May 12, 2010 1:25 PM    Primary Care Provider:  Lemont Fillers FNP  CC:  Pt here for 1 month follow up. States she is having mood swings, more anxiety. Stopped Citalopram x 1 week but symptoms continue. Has also had facial hair x months but feels it is worse recently., and Depression.  History of Present Illness: Jessica Roberson is a 30 year old female who presents today for followup.  #1. Anxiety-Noted increased tearfulness after starting the citalopram.  Felt doomful and sad, "like everything was going to go wrong."  Mood swings: happy but then easily angry.  Feels "all over the place."  Denies suicide ideation.  Stopped citalopram 1 week ago.  Notes that she feels very anxious- has trouble waiting.  She did not pick her feet as much when she was on the medication,  but was sad.    #2. Facial hair- started at age 61, now getting heavier and also including her chest.  Walking 30 minutes a day.  Has Mirena- history of irregular periods prior.    Depression History:      Positive alarm features for depression include fatigue (loss of energy).  However, she denies insomnia, hypersomnia, impaired  concentration (indecisiveness), and recurrent thoughts of death or suicide.  The patient denies symptoms of a manic disorder including less need for sleep, excessive buying sprees, excessive sexual indiscretions, and excessive foolish business investments.        Psychosocial stress factors include the recent death of a loved one, a recent divorce, a recent marital separation, and major life changes.         Allergies: 1)  ! Aspirin 2)  ! Penicillin 3)  ! * Dilaudid 4)  ! Tramadol Hcl  Past History:  Past Medical History: Last updated: 12/20/2009 Migraine Headaches Multiple Sclerosis -2004 Anal Fissure Arthritis Obesity Depression Ulcer--09-2009 Allergies Colon polyp--2005  Past Surgical History: Last updated: 10/31/2009 C- Section Knee Arthroscopy x3 Foot Surgery x2 Cholecystectomy--2007 Hernia Surgery--1984 colonoscopy--2005 Mallet Toe correction--2011 Tendon Transfer--2004  Review of Systems       see history of present illness  Physical Exam  General:  Well-developed,well-nourished,in no acute distress; alert,appropriate and cooperative throughout examination Head:  Normocephalic and atraumatic without obvious abnormalities. No apparent alopecia or balding. Neck:  No deformities, masses, or tenderness noted. Lungs:  Normal respiratory effort, chest expands symmetrically. Lungs are clear to auscultation, no crackles or wheezes. Heart:  Normal rate and regular rhythm. S1 and S2 normal without gallop, murmur, click,  rub or other extra sounds. Skin:  positive facial hair noted on chin and neck Psych:  Cognition and judgment appear intact. Alert and cooperative with normal attention span and concentration. No apparent delusions, illusions, hallucinations   Impression & Recommendations:  Problem # 1:  ANXIETY (ICD-300.00) Assessment Deteriorated  patient noted increased depressive symptoms well on citalopram. She did note some improvement in her habit of skin  picking. At this time given her mood swings I recommended to the patient that she see a psychiatrist. I have given her a prescription for Xanax to help with the anxiety in the meantime goal deferred further med management to psychiatry. I am concerned about the possibility of bipolar disorder. The patient will also be referred for therapy. 25 minutes were spent with the patient today. Greater than 50% of this time was spent counseling the patient on her anxiety and depression. The following medications were removed from the medication list:    Citalopram Hydrobromide 20 Mg Tabs (Citalopram hydrobromide) ..... One tablet by mouth by mouth once daily Her updated medication list for this problem includes:    Nortriptyline Hcl 25 Mg Caps (Nortriptyline hcl) ..... One tab by mouth at bedtime    Xanax 0.25 Mg Tabs (Alprazolam) ..... One tablet by mouth up to 3 times a day as needed for anxiety  Orders: Psychiatric Referral (Psych) Psychology Referral (Psychology)  Problem # 2:  HIRSUTISM (ICD-704.1) Assessment: Comment Only polycystic ovary disease is a possibility with this patient. She has history of irregular periods, and hyperglycemia.  She is also obese. Check studies as listed below Orders: T-Prolactin [id number]) T-FSH [id number]) T-Testosterone; Total [id number])  Complete Medication List: 1)  Cyclobenzaprine Hcl 10 Mg Tabs (Cyclobenzaprine hcl) .... Take 1 tablet by mouth once a day. 2)  Nortriptyline Hcl 25 Mg Caps (Nortriptyline hcl) .... One tab by mouth at bedtime 3)  Valacyclovir Hcl 500 Mg Tabs (Valacyclovir hcl) .... Two times a day as needed. 4)  Gilenya 0.5 Mg Caps (Fingolimod hcl) .... Once daily 5)  Benadryl 25 Mg Caps (Diphenhydramine hcl) .... As needed. 6)  Zegerid 40-1100 Mg Caps (Omeprazole-sodium bicarbonate) .... One cap by mouth daily 7)  Amantadine Hcl 100 Mg Caps (Amantadine hcl) .Marland Kitchen.. 1 two times a day 8)  Claritin 10 Mg Tabs (Loratadine) .... One tablet by  mouth daily as needed. 9)  Xanax 0.25 Mg Tabs (Alprazolam) .... One tablet by mouth up to 3 times a day as needed for anxiety 10)  Mirena 20 Mcg/24hr Iud (Levonorgestrel) .... Per gyn  Other Orders: TLB-TSH (Thyroid Stimulating Hormone) [id number])  Patient Instructions: 1)  Complete your lab work today on the first floor.  2)  You will be contacted about your referral to psychiatry and to see a therapist. 3)  Please follow up in 1 month. Prescriptions: XANAX 0.25 MG TABS (ALPRAZOLAM) one tablet by mouth up to 3 times a day as needed for anxiety  #30 x 0   Entered and Authorized by:   Lemont Fillers FNP   Signed by:   Lemont Fillers FNP on 05/12/2010   Method used:   Print then Give to Patient   RxID:   [id number]    Orders Added: 1)  T-Prolactin [[id number]] 2)  TLB-TSH (Thyroid Stimulating Hormone) [[id number]] 3)  T-FSH [[id number]] 4)  T-Testosterone; Total [phone] 5)  Psychiatric Referral [Psych] 6)  Psychology Referral [Psychology] 7)  Est. Patient Level IV [32355]    Current Allergies (reviewed today): ! ASPIRIN ! PENICILLIN ! *  DILAUDID ! TRAMADOL HCL

## 2010-05-25 NOTE — Progress Notes (Signed)
Summary: Prior Auth  Omeprazole approved  Phone Note Outgoing Call   Call placed by: Darral Dash Call placed to: Insurer Details for Reason: Prior Auth Summary of Call: Prior Auth for Omeprazole   form requested     Initial call taken by: Darral Dash,  April 03, 2010 10:50 AM  Follow-up for Phone Call        PA form received. Left message for pt to return my call. Need to know if pt has tried other formulary alternatives and if so, which ones. Nicki Guadalajara Fergerson CMA Duncan Dull)  April 03, 2010 4:20 PM   Left message on machine to return my call. Nicki Guadalajara Fergerson CMA Duncan Dull)  April 04, 2010 11:05 AM   Additional Follow-up for Phone Call Additional follow up Details #1::        Pt returned my call and states she has tried Protonix, Nexium and Prilosec OTC. Form completed and forwarded to Alysse Rathe for review. Nicki Guadalajara Fergerson CMA Duncan Dull)  April 04, 2010 3:57 PM   Form signed and faxed to [facility] Prior Auth [id number]  (Ph) [phone]. Awaiting approval / denial. Mervin Kung CMA Duncan Dull)  April 05, 2010 8:54 AM     Additional Follow-up for Phone Call Additional follow up Details #2::    Received approval from [facility] Medicaid for Omeprazole sodium Bicarbonate.  Notified Michelle at [employer]. and left detailed message on pt's cell phone of status and to call if any questions. Nicki Guadalajara Fergerson CMA Duncan Dull)  April 06, 2010 2:51 PM

## 2010-05-31 NOTE — Miscellaneous (Signed)
Summary: Controlled Substances Contract/[facility]  Controlled Substances Contract/[facility]   Imported By: Sherian Rein 05/23/2010 09:00:41  _____________________________________________________________________  External Attachment:    Type:   Image     Comment:   External Document

## 2010-05-31 NOTE — Letter (Signed)
   [place] at [facility] [address] [place], [place]  [address]  [place] Phone: [phone]      May 22, 2010   [facility] [address] [place], [place] [address]  RE:  LAB RESULTS  Dear  [name],  The following is an interpretation of your most recent lab tests.  Please take note of any instructions provided or changes to medications that have resulted from your lab work.   Your hormonal studies are all normal.  Please keep your upcoming appointment.   Sincerely Yours,    Lemont Fillers FNP

## 2010-05-31 NOTE — Progress Notes (Signed)
----   Converted from flag ---- ---- 05/18/2010 8:37 AM, Darral Dash wrote: Referral request to [facility] ,they do not have provider that take Medicaid,  Victorino Dike suggested [facility]  . appt with psychiatry  scheduled  January  31   [facility] ([facility]) ------------------------------

## 2010-06-02 ENCOUNTER — Encounter: Payer: Self-pay | Admitting: Family

## 2010-06-02 ENCOUNTER — Ambulatory Visit (INDEPENDENT_AMBULATORY_CARE_PROVIDER_SITE_OTHER)
Admission: RE | Admit: 2010-06-02 | Discharge: 2010-06-02 | Disposition: A | Discharge status: Home / Self Care | Payer: Medicaid Other | Source: Ambulatory Visit | Attending: Internal Medicine | Admitting: Internal Medicine

## 2010-06-02 ENCOUNTER — Ambulatory Visit (INDEPENDENT_AMBULATORY_CARE_PROVIDER_SITE_OTHER): Payer: Medicaid Other | Admitting: Family

## 2010-06-02 ENCOUNTER — Other Ambulatory Visit: Payer: Self-pay | Admitting: Internal Medicine

## 2010-06-02 ENCOUNTER — Ambulatory Visit (HOSPITAL_BASED_OUTPATIENT_CLINIC_OR_DEPARTMENT_OTHER)
Admission: RE | Admit: 2010-06-02 | Discharge: 2010-06-02 | Disposition: A | Discharge status: Home / Self Care | Payer: Medicaid Other | Source: Ambulatory Visit | Attending: Internal Medicine | Admitting: Internal Medicine

## 2010-06-02 DIAGNOSIS — L680 Hirsutism: Secondary | ICD-10-CM

## 2010-06-02 DIAGNOSIS — Z8041 Family history of malignant neoplasm of ovary: Secondary | ICD-10-CM | POA: Insufficient documentation

## 2010-06-02 DIAGNOSIS — N926 Irregular menstruation, unspecified: Secondary | ICD-10-CM | POA: Insufficient documentation

## 2010-06-02 DIAGNOSIS — J329 Chronic sinusitis, unspecified: Secondary | ICD-10-CM | POA: Insufficient documentation

## 2010-06-02 DIAGNOSIS — Z975 Presence of (intrauterine) contraceptive device: Secondary | ICD-10-CM | POA: Insufficient documentation

## 2010-06-02 DIAGNOSIS — F411 Generalized anxiety disorder: Secondary | ICD-10-CM

## 2010-06-05 ENCOUNTER — Telehealth: Payer: Self-pay | Admitting: Family

## 2010-06-05 DIAGNOSIS — E282 Polycystic ovarian syndrome: Secondary | ICD-10-CM | POA: Insufficient documentation

## 2010-06-08 NOTE — Letter (Signed)
Summary: Out of Work  Adult nurse at [employer]. [address]   [place], [place] [address]   Phone: [phone]  Fax: [phone]    June 02, 2010   Employee:  Jessica Roberson    To Whom It May Concern:   For Medical reasons, please excuse the above named employee from work for the following dates:  Start:   06/01/10  End:   06/03/10  If you need additional information, please feel free to contact our office.         Sincerely,    Lemont Fillers FNP

## 2010-06-08 NOTE — Assessment & Plan Note (Signed)
Summary: 1 MO FU/MHF--rm 4   Vital Signs:  Patient profile:   30 year old female Height:      68 inches Weight:      311 pounds BMI:     47.46 Temp:     97.9 degrees F oral Pulse rate:   78 / minute Pulse rhythm:   regular Resp:     16 per minute BP sitting:   115 / 80  Vitals Entered By: Mervin Kung CMA Duncan Dull) (June 02, 2010 10:38 AM) CC: Pt here for 1 month follow up. Has had headache since Wednesday with sinus drainage and dry nostrils. Is Patient Diabetic? No Pain Assessment Patient in pain? no      Comments Pt stopped Amantadine as it didn't seem to be helping.  Pt has noticed when she takes Nortriptyline and cyclobenzaprine at the same time that she itches. Does not notice this if she takes them separately. Nicki Guadalajara Fergerson CMA (AAMA)  June 02, 2010 10:45 AM    Primary Care Provider:  Lemont Fillers FNP  CC:  Pt here for 1 month follow up. Has had headache since Wednesday with sinus drainage and dry nostrils..  History of Present Illness: 1) Anxiety- has apt at the [facility] center next week.  Has not seen a therapist.  Some improvement with use of xanax, but has only used a couple times.  Depression the same.  Has filed her nails so that she can't pick her skin.    2) Menses- have never been normal.   No period on the mirena.  Previously had period 3-4 times a year.   3) Nasal congestion-  feels congestion,  "bad HA since wednesday." Felt like her HA was worse yesterday.  Only over the left eye.  Has been using nasal saline without improvment.  + tenderness overlying cheeks and left side of her forehead.  Denies fever.  Energy is fair.     Allergies: 1)  ! Aspirin 2)  ! Penicillin 3)  ! * Dilaudid 4)  ! Tramadol Hcl  Past History:  Past Medical History: Last updated: 12/20/2009 Migraine Headaches Multiple Sclerosis -2004 Anal Fissure Arthritis Obesity Depression Ulcer--09-2009 Allergies Colon polyp--2005  Past Surgical History: Last  updated: 10/31/2009 C- Section Knee Arthroscopy x3 Foot Surgery x2 Cholecystectomy--2007 Hernia Surgery--1984 colonoscopy--2005 Mallet Toe correction--2011 Tendon Transfer--2004  Review of Systems       see history of present illness  Physical Exam  General:  Well-developed,well-nourished,in no acute distress; alert,appropriate and cooperative throughout examination Head:  positive maxillary and left frontal sinus tenderness to palpation Eyes:  pupils equal round reactive to light and accommodation sclera are clear Ears:  External ear exam shows no significant lesions or deformities.  Otoscopic examination reveals clear canals, tympanic membranes are intact bilaterally without bulging, retraction, inflammation or discharge. Hearing is grossly normal bilaterally. Mouth:  Oral mucosa and oropharynx without lesions or exudates.  Teeth in good repair. Neck:  No deformities, masses, or tenderness noted. Lungs:  Normal respiratory effort, chest expands symmetrically. Lungs are clear to auscultation, no crackles or wheezes. Heart:  Normal rate and regular rhythm. S1 and S2 normal without gallop, murmur, click, rub or other extra sounds.   Impression & Recommendations:  Problem # 1:  SINUSITIS (ICD-473.9) Assessment New we'll plan treatment with clarithromycin given patient's multiple drug allergies. Continue Flonase and Claritin. Orders: Misc. Referral (Misc. Ref)  Her updated medication list for this problem includes:    Clarithromycin 500 Mg Tabs (Clarithromycin) ..... One  tab by mouth two times a day for 10 days    Fluticasone Propionate 50 Mcg/act Susp (Fluticasone propionate) .Marland Kitchen..Marland Kitchen Two sprays each nostril once daily  Problem # 2:  HIRSUTISM (ICD-704.1) Assessment: Unchanged the patient was noted to have upper level normal evaluation of testosterone. Other hormonal studies were unremarkable. She does note history of very irregular menses in the past, has obesity, history of acne,  hirsutism. Will order an ultrasound of the ovaries to further evaluate for polycystic ovary disease. Orders: Misc. Referral (Misc. Ref)  Problem # 3:  ANXIETY (ICD-300.00) Assessment: Improved anxiety is slightly improved. She is awaiting her appointment at the [facility] for evaluation by psychiatry. Her updated medication list for this problem includes:    Nortriptyline Hcl 25 Mg Caps (Nortriptyline hcl) ..... One tab by mouth at bedtime    Xanax 0.25 Mg Tabs (Alprazolam) ..... One tablet by mouth up to 3 times a day as needed for anxiety  Complete Medication List: 1)  Cyclobenzaprine Hcl 10 Mg Tabs (Cyclobenzaprine hcl) .... Take 1 tablet by mouth once a day. 2)  Nortriptyline Hcl 25 Mg Caps (Nortriptyline hcl) .... One tab by mouth at bedtime 3)  Valacyclovir Hcl 500 Mg Tabs (Valacyclovir hcl) .... Two times a day as needed. 4)  Gilenya 0.5 Mg Caps (Fingolimod hcl) .... Once daily 5)  Benadryl 25 Mg Caps (Diphenhydramine hcl) .... As needed. 6)  Zegerid 40-1100 Mg Caps (Omeprazole-sodium bicarbonate) .... One cap by mouth daily 7)  Amantadine Hcl 100 Mg Caps (Amantadine hcl) .Marland Kitchen.. 1 two times a day 8)  Claritin 10 Mg Tabs (Loratadine) .... One tablet by mouth daily as needed. 9)  Xanax 0.25 Mg Tabs (Alprazolam) .... One tablet by mouth up to 3 times a day as needed for anxiety 10)  Mirena 20 Mcg/24hr Iud (Levonorgestrel) .... Per gyn 11)  Clarithromycin 500 Mg Tabs (Clarithromycin) .... One tab by mouth two times a day for 10 days 12)  Fluticasone Propionate 50 Mcg/act Susp (Fluticasone propionate) .... Two sprays each nostril once daily  Patient Instructions: 1)  You will be contacted about your ultrasound.  2)  Please follow up in 3 months. Prescriptions: FLUTICASONE PROPIONATE 50 MCG/ACT SUSP (FLUTICASONE PROPIONATE) two sprays each nostril once daily  #1 x 0   Entered and Authorized by:   Lemont Fillers FNP   Signed by:   Lemont Fillers FNP on 06/02/2010    Method used:   Electronically to        [id number] (retail)       [address]       [place], [place]  [address]       Ph: [phone] or [phone]       Fax: [phone]   RxID:   [id number] CLARITHROMYCIN 500 MG TABS (CLARITHROMYCIN) one tab by mouth two times a day for 10 days  #20 x 0   Entered and Authorized by:   Lemont Fillers FNP   Signed by:   Lemont Fillers FNP on 06/02/2010   Method used:   Electronically to        [id number] (retail)       [address]       [place], [place]  [address]       Ph: [phone] or [phone]       Fax: [phone]   RxID:   [id number]    Orders Added: 1)  Misc. Referral [Misc. Ref] 2)  Est. Patient  Level III [id number]    Current Allergies (reviewed today): ! ASPIRIN ! PENICILLIN ! * DILAUDID ! TRAMADOL HCL

## 2010-06-14 NOTE — Progress Notes (Signed)
Summary: pt returned phone call  Phone Note Outgoing Call   Call placed by: Lemont Fillers FNP,  June 05, 2010 10:23 AM Call placed to: Patient Summary of Call: Left message on patient cell requesting call back to discuss ultrasound results.   Initial call taken by: Lemont Fillers FNP,  June 05, 2010 10:24 AM  Follow-up for Phone Call        pt returned phone call. phone# [phone]. pt does go to lunch from 1:30-2:30 and said that would be a good time to call.  Follow-up by: Elba Barman,  June 05, 2010 11:38 AM  Additional Follow-up for Phone Call Additional follow up Details #1::        Spoke with patient.  Reviewed results.  Discussed plans for referral to endocrinology. Additional Follow-up by: Lemont Fillers FNP,  June 05, 2010 2:22 PM  New Problems: POLYCYSTIC OVARIAN DISEASE (ICD-256.4)   New Problems: POLYCYSTIC OVARIAN DISEASE (ICD-256.4)

## 2010-06-20 ENCOUNTER — Encounter: Payer: Self-pay | Admitting: Family

## 2010-07-04 NOTE — Consult Note (Signed)
Summary: [facility]   Imported By: Maryln Gottron 06/26/2010 09:39:23  _____________________________________________________________________  External Attachment:    Type:   Image     Comment:   External Document

## 2010-07-10 ENCOUNTER — Encounter: Payer: Self-pay | Admitting: Occupational Therapy

## 2010-07-12 LAB — URINALYSIS, ROUTINE W REFLEX MICROSCOPIC
Hgb urine dipstick: NEGATIVE
Nitrite: NEGATIVE
Protein, ur: NEGATIVE mg/dL
Urobilinogen, UA: 0.2 mg/dL (ref 0.0–1.0)

## 2010-07-12 LAB — DIFFERENTIAL
Basophils Absolute: 0.1 10*3/uL (ref 0.0–0.1)
Basophils Relative: 2 % — ABNORMAL HIGH (ref 0–1)
Neutro Abs: 3.5 10*3/uL (ref 1.7–7.7)
Neutrophils Relative %: 90 % — ABNORMAL HIGH (ref 43–77)

## 2010-07-12 LAB — BASIC METABOLIC PANEL
CO2: 29 mEq/L (ref 19–32)
Calcium: 9.5 mg/dL (ref 8.4–10.5)
Creatinine, Ser: 0.7 mg/dL (ref 0.4–1.2)
GFR calc Af Amer: >60 mL/min (ref >60)
GFR calc non Af Amer: >60 mL/min (ref >60)
Glucose, Bld: 96 mg/dL (ref 70–99)

## 2010-07-12 LAB — CBC
MCHC: 32.8 g/dL (ref 30.0–36.0)
RBC: 5.32 MIL/uL — ABNORMAL HIGH (ref 3.87–5.11)
RDW: 13.6 % (ref 11.5–15.5)

## 2010-07-12 LAB — PREGNANCY, URINE: Preg Test, Ur: NEGATIVE

## 2010-07-30 LAB — URINALYSIS, ROUTINE W REFLEX MICROSCOPIC
Nitrite: NEGATIVE
Protein, ur: NEGATIVE mg/dL
Urobilinogen, UA: 0.2 mg/dL (ref 0.0–1.0)

## 2010-07-30 LAB — URINE MICROSCOPIC-ADD ON

## 2010-08-03 ENCOUNTER — Ambulatory Visit (INDEPENDENT_AMBULATORY_CARE_PROVIDER_SITE_OTHER): Payer: Medicaid Other | Admitting: Family Medicine

## 2010-08-03 ENCOUNTER — Encounter: Payer: Self-pay | Admitting: Family Medicine

## 2010-08-03 VITALS — BP 145/95 | HR 85 | Temp 98.0°F | Ht 68.0 in | Wt 306.0 lb

## 2010-08-03 DIAGNOSIS — J019 Acute sinusitis, unspecified: Secondary | ICD-10-CM

## 2010-08-03 MED ORDER — CEFUROXIME AXETIL 500 MG PO TABS: 500.0000 mg | ORAL_TABLET | Freq: Two times a day (BID) | ORAL | Status: AC

## 2010-08-03 MED ORDER — FEXOFENADINE HCL 180 MG PO TABS: 180.0000 mg | ORAL_TABLET | Freq: Every day | ORAL | Status: DC

## 2010-08-03 MED ORDER — HYDROCODONE-ACETAMINOPHEN 5-500 MG PO CAPS: ORAL_CAPSULE | ORAL | Status: DC

## 2010-08-03 NOTE — Progress Notes (Signed)
  Subjective:     Jessica Roberson is a 30 y.o. female who presents for evaluation of sinus pain. Symptoms include: right sided facial pain, headaches, nasal congestion and sinus pressure. Onset of symptoms was 3 days ago: she describes "double sickening". Symptoms have been rapidly worsening since that time.  Some blood noted from right nostril this am prompted appointment.  No visual complaints.  No eye d/c or redness. Past history is significant for recurrent sinusitis per pt report.  She doesn't recall ever having a sinus CT scan.. Patient is a non-smoker.  Of note, she's been using claritin and thinks it isn't helping anymore.    The following portions of the patient's history were reviewed and updated as appropriate: past social history and past surgical history.  Review of Systems Constitutional: negative for fevers  No coughing.  ST on day one but now resolved.   Objective:  Blood pressure 145/95, pulse 85, temperature 98 F (36.7 C), temperature source Oral, height 5\' 8"  (1.727 m), weight 306 lb (138.801 kg), SpO2 100.00%. Gen: alert, NAD, NONTOXIC APPEARING. HEENT: eyes without injection, drainage, or swelling.   EOMI. Ears: EACs clear, TMs with normal light reflex and landmarks.  Nose: Clear rhinorrhea, with some dried, crusty exudate adherent to mildly injected mucosa.  No purulent d/c.  Right frontal sinus and maxillary sinus areas are TTP.  Mild TTP over periauricular areas on the right, without LAD.  No facial swelling.  Throat and mouth without focal lesion.  No pharyngial swelling, erythema, or exudate.   Neck: supple, no LAD.   LUNGS: CTA bilat, nonlabored resps.   CV: RRR, no m/r/g. EXT: no c/c/e SKIN: no rash     Assessment:    Acute atypical sinusitis.  MS: fingolimod may cause immunosuppression.   Plan:    Nasal saline sprays. Nasal steroids per medication orders. Antihistamines per medication orders. Ceftin per medication orders.  I recommended she hold her  fingolimod for about 5d until feeling much improved. I changed her antihistamine from loratidine to fexofenadine.

## 2010-08-31 ENCOUNTER — Encounter: Payer: Self-pay | Admitting: Family

## 2010-09-01 ENCOUNTER — Ambulatory Visit: Payer: Medicaid Other | Admitting: Family

## 2010-09-01 DIAGNOSIS — Z0289 Encounter for other administrative examinations: Secondary | ICD-10-CM

## 2010-09-05 NOTE — H&P (Signed)
NAME:  Jessica Roberson              ACCOUNT NO.:  [id number]   MEDICAL RECORD NO.:  [id number]           PATIENT TYPE:   LOCATION:                                 FACILITY:   PHYSICIAN:  Dineen Kid. Rana Snare, M.D.    DATE OF BIRTH:  1980-07-04   DATE OF ADMISSION:  03/27/2007  DATE OF DISCHARGE:                              HISTORY & PHYSICAL   HISTORY OF PRESENT ILLNESS:  [name] is a 30 year old G1 at 38-1/[redacted]  weeks gestational age, with AN EDC of April 06, 2007 (complicated by  multiple sclerosis, followed by neurologist at [facility]).  She has  had several exacerbations throughout the pregnancy.  Her neurologist  recently recommended delivery by cesarean section, and she presents for  that today.   PAST MEDICAL HISTORY:  1. Multiple sclerosis.  2. Obesity.  3. History of pancreatitis in the past.   PAST SURGICAL HISTORY:  1. She has had knee surgery x3.  2. Foot surgery.   MEDICATIONS CURRENTLY:  Prenatal vitamins.   ALLERGIES:  1. PENICILLIN.  2. ASPIRIN.   PHYSICAL EXAMINATION:  VITAL SIGNS:  Her blood pressure is 124/78.  HEART:  Regular rate and rhythm.  LUNGS:  Clear to auscultation bilaterally.  ABDOMEN:  Obese and gravid.  Fetal heart tones in the 150s.  PELVIC:  Cervix is closed, thick and high.   IMPRESSION AND PLAN:  Intrauterine pregnancy at 38-1/[redacted] weeks gestational  age, with multiple sclerosis, with the neurologist now recommending  cesarean section for delivery.  She also has sickle trait, with the  father of baby declining hemoglobin electrophoresis.   PLANS:  Primary low segment transverse cesarean section.  The risks and  benefits of the procedure were discussed at length.  Informed consent  was obtained.     Dineen Kid Rana Snare, M.D.  Electronically Signed    DCL/MEDQ  D:  03/26/2007  T:  03/27/2007  Job:  [id number]

## 2010-09-05 NOTE — Op Note (Signed)
Jessica Roberson, Jessica Roberson             ACCOUNT NO.:  [id number]   MEDICAL RECORD NO.:  [id number]          PATIENT TYPE:  INP   LOCATION:  9129                          FACILITY:  [facility]   PHYSICIAN:  Dineen Kid. Rana Snare, M.D.    DATE OF BIRTH:  1980/09/07   DATE OF PROCEDURE:  03/27/2007  DATE OF DISCHARGE:                               OPERATIVE REPORT   The patient presents today for primary cesarean section.   PREOPERATIVE DIAGNOSIS:  Intrauterine pregnancy at 38-1/2 weeks, history  of multiple sclerosis with the neurologist recommending primary cesarean  section.   POSTOPERATIVE DIAGNOSIS:  Intrauterine pregnancy at 38-1/2 weeks,  history of multiple sclerosis with the neurologist recommending primary  cesarean section.   PROCEDURE:  Primary low segment transverse cesarean section.   SURGEON:  Dineen Kid. Rana Snare, M.D.   ASSISTANTFreddy Finner, M.D.   ANESTHESIA:  Spinal.   INDICATIONS FOR PROCEDURE:  The patient is a 30 year old gravida 1, at  38-1/[redacted] weeks gestational age who was seen by her neurologist last week  and he recommended primary cesarean section due to her MS and the fact  that she has had several recurrences throughout the pregnancy and he can  more aggressively treat her if she is delivered even though she is not  having an exacerbation at this time.  The patient desires primary  cesarean section and presents for this today.  Her pregnancy is  complicated only by obesity and multiple sclerosis.  Risks and benefits  were discussed and informed consent was obtained.   FINDINGS:  A viable female infant, Apgars 8 and 9, pH arterial 7.33,  weight 5 pounds 14 ounces.   DESCRIPTION OF PROCEDURE:  After adequate analgesia, the patient was  placed in the supine position with left lateral tilt.  She is sterilely  prepped and draped and the bladder is sterilely drained with a Foley  catheter.  A Pfannenstiel skin incision was made two fingerbreadths  above the pubic symphysis  and taken down sharply to the fascia which was  incised transversely and extended superiorly and inferiorly off the  bellies of the rectus muscle which were separated sharply in the  midline.  The peritoneum was entered sharply.  The bladder flap was  created and placed behind the bladder blade.  A low segment myotomy  incision was made down to the infant's vertex, extended laterally with  the operator's fingertips.  The infant's vertex was delivered  atraumatically with a vacuum extractor.  Nares and pharynx suctioned.  Infant delivered, cord clamped and cut, and handed to pediatricians for  resuscitation.  Cord blood was obtained, the placenta extracted  manually.  The uterus was exteriorized and wiped clean with a dry  laparoscopy.  The myotomy incision was closed in two layers, the first  being running locking layer, the second being an imbricating layer of 0  Monocryl suture.  The uterus was placed back into the peritoneal cavity  and after copious amount of irrigation, adequate hemostasis was  achieved, the peritoneum was closed with 0 Monocryl.  The rectus muscle  plicated in the midline.  Irrigation was applied and after adequate  hemostasis, the fascia was closed with a #1 PDS in a running fashion.  Irrigation was applied and after adequate hemostasis, the Scarpa's  fascia was reapproximated with 2-0 plain suture.  Irrigation to the skin  applied and after adequate  hemostasis, skin stapled and Steri-Strips applied.  The patient  tolerated the procedure well and was stable on transfer to the recovery  room.  Needle, sponge, and instrument counts correct x3.  Estimated  blood loss was 800 mL.  The patient received 900 mg of Clindamycin  preoperatively.      Dineen Kid Rana Snare, M.D.  Electronically Signed     DCL/MEDQ  D:  03/27/2007  T:  03/27/2007  Job:  [id number]

## 2010-09-08 NOTE — Op Note (Signed)
[facility]  Patient:    Jessica Roberson, Jessica Roberson Visit Number: [id number] MRN: [id number]          Service Type: DSU Location: [facility] Attending Physician:  Georgena Spurling Dictated by:   Georgena Spurling, M.D. Proc. Date: 04/09/01 Admit Date:  04/09/2001                             Operative Report  PREOPERATIVE DIAGNOSIS:  Right lateral collateral ligament, posterolateral corner tears.  POSTOPERATIVE DIAGNOSIS:  Right lateral collateral ligament, posterolateral corner tears.  OPERATION PERFORMED:  Right lateral collateral ligament reconstruction posterolateral corner repair.  SURGEON:  Georgena Spurling, M.D.  ASSISTANT:  Jamelle Rushing, P.A.  ANESTHESIA:  INDICATIONS FOR PROCEDURE:  The patient is a 30 year old status post complete knee dislocation and status post ACL PCL reconstruction with lateral collateral, posterior corner repair which has failed.  Informed consent was obtained.  DESCRIPTION OF PROCEDURE:  The patient was laid supine and administered general LMA anesthesia.  The right lower extremity was prepped and draped in the usual sterile fashion.  Old incision was used and the old scar was ellipsed since there was slight keloid formation.  The tourniquet was not inflated due to the fact that I felt there was enough insult already to the extremity as well as the fact that it may have been a venous tourniquet due to the obesity of the leg.  I then used cautery dissection to dissect down to the fascia lata.  The fascia lata was then incised along the length of the incision.  At this point we identified the knee capsule and made a hockey stick incision in that as well.  We then subperiosteally dissected off the LCL popliteus both of which had been avulsed and stretched out.  Our old suture anchor and knots were debrided.  At this point we dissected down through fascia to the fibular head and the subperiosteally dissected around each  side placing Bennett retractors carefully behind the head.  We then used a 5 mm ACL reamer to ream anterior to posterior and then cross the bridge with 6 mm x 20 mm staple to reinforce the lateral cortex.  We then passed the hamstring allograft anterior to posterior.  We then used a rongeur to remove all scar tissue and any soft tissue off of the lateral cortex and off the lateral epicondyle.  We then nibbled at the bone making sure that we had good bleeding bone.  At this point we placed a 25 mm post and spiked washer taking the posterior limb of the allograft anteriorly to recreate the popliteus and the anterior limb posteriorly to recreate the fibular collateral ligament.  We then tensioned these with a slight valgus force on the leg in approximately 30 degrees of flexion and tightened it down.  We then sewed the free ends back over itself and onto the graft.  I then placed a 5.0 mm Bioabsorbable corkscrew with suture anterior to the repair and brought the native popliteus over the top and sutured it down there.  I then placed one superior and placed the old fibular collateral complex to that.  I then placed two suture anchors posteriorly and reefed the posterolateral capsule up to that.  I then closed the knee capsule with interrupted 0 Vicryl, the fascia lata with interrupted #1 Vicryls, the deep soft tissue with interrupted 0 Vicryls and skin with subcuticular 2-0 Vicryls and Steri-Strips.  Dressed with Adaptic, 4 x 4s, dressing sponges, sterile Webril and Ace wrap.  Stress on the knee felt excellent.  We then put it in a brace from 0 to 30.  The patient tolerated the procedure well.  COMPLICATIONS:  None.  DRAINS:  None.  TOURNIQUET:  None. Dictated by:   Georgena Spurling, M.D. Attending Physician:  Georgena Spurling DD:  04/09/01 TD:  04/09/01 Job: [id number]

## 2010-09-08 NOTE — Consult Note (Signed)
[facility]  Patient:    Jessica Roberson, Jessica Roberson Visit Number: [id number] MRN: [id number]          Service Type: EMS Location: ED Attending Physician:  Sandi Raveling Dictated by:   Georgena Spurling, M.D. Consultation Date: 01/02/01 Admit Date:  01/02/2001                            Consultation Report  ADMITTING DIAGNOSES:  Right knee dislocation.  HISTORY OF PRESENT ILLNESS:  Patient is an 30 year old black female who fell getting out of the shower at 12:30 p.m.  She went to the ground, had twisting injury to the knee, complained of extraordinary knee pain and the mother called EMS for transport.  She arrived at the emergency room some time after 1:30.  They initially consulted another physician that had dealt with the family before but he was unavailable and they consulted me.  By the time I had reached the emergency room they had already relocated the knee.  They had documented that she did have pulses prior even to the relocation.  No other medical problems, medications, or allergies.  PHYSICAL EXAMINATION  GENERAL:  She is a slightly overweight black female in no distress; however, she had some IV sedation and although coherent, was a little somnolent.  EXTREMITIES:  The skin of the right lower extremity was normal.  She did have difficulty dorsiflexing and everting the foot, but did have some gross sensation there.  It was decreased, but present.  Pulses were excellent and equal to the opposite extremity.  Gross examination revealed a positive anterior drawer and Lachman, some opening to valgus stress.  X-rays were reviewed and did show an anterior knee dislocation and anatomic alignment, post reduction.  IMPRESSION:  Right knee anterior dislocation which was out approximately two to two and a half hours, however, does not seem that there was any problems with pulses during that time.  TREATMENT:  Knee immobilizer.  Admission for strict  elevation, ice, and neurovascular checks to make sure that she does not get compartment syndrome. We are also going to get an MRI while she is in the hospital to assess ligamentous injury. Dictated by:   Georgena Spurling, M.D. Attending Physician:  Sandi Raveling DD:  01/02/01 TD:  01/02/01 Job: [id number]

## 2010-09-08 NOTE — Op Note (Signed)
[facility]  Patient:    Jessica Roberson, Jessica Roberson                    MRN: [id number] Proc. Date: 11/25/00 Adm. Date:  [id number] Attending:  Maxie Better                           Operative Report  PREOPERATIVE DIAGNOSIS:       Elective termination, intrauterine gestation a 11.6 weeks.  POSTOPERATIVE DIAGNOSIS:      Elective termination, intrauterine gestation at 11.6 weeks.  PROCEDURE:                    Suction dilation and evacuation.  SURGEON:                      Sheronette A. Cherly Hensen, M.D.  ANESTHESIA:                   MAC, paracervical block.  INDICATIONS:                  This is an 30 year old, gravida 1, para 0 female with a documented intrauterine pregnancy by ultrasound of about 11.6 weeks who now presents for elective termination. Risk and benefit of the procedure had been explained to the patient and her mother. Consent was signed. The patient was transferred to the operating room.  DESCRIPTION OF PROCEDURE:     Under adequate monitored anesthesia, the patient was placed in the dorsal lithotomy position. Examination under anesthesia revealed an axial 12-week size uterus, no adnexal masses could be appreciated but the exam was limited by the patients body habitus. The patient was sterilely prepped and draped in the usual fashion. The bladder was catheterized for a moderate amount of urine. A bivalve speculum was placed in the vagina. A total of 20 cc of 1% Nesacaine was injected paracervically at 3 and 9 oclock. The anterior lip of the cervix was subsequently grasped with a single-tooth tenaculum. The cervix was then serially dilated up to #25 [facility] dilator. The cervix would not allow for the 27 or higher Pratt dilator, therefore, a #8 mm suction cannula was introduced into the uterine cavity. Cavity was suctioned for a large amount of products of conception. Cavity was curetted, re-suctioned, and this was continued until the  cavity was felt to have been cleaned; at that time, all instruments were then removed from the vagina. Specimen labeled products of conception was sent to pathology. Estimated blood loss was minimal. Complications were none. The patient tolerated the procedure well and was transferred to recovery room in stable condition. DD:  11/25/00 TD:  11/26/00 Job: [id number]

## 2010-09-08 NOTE — Discharge Summary (Signed)
Jessica Roberson, WALLANDER             ACCOUNT NO.:  [id number]   MEDICAL RECORD NO.:  [id number]          PATIENT TYPE:  INP   LOCATION:  9129                          FACILITY:  [facility]   PHYSICIAN:  Julio Sicks, N.P.     DATE OF BIRTH:  1980/05/02   DATE OF ADMISSION:  03/27/2007  DATE OF DISCHARGE:  03/30/2007                               DISCHARGE SUMMARY   ADMISSION DIAGNOSES:  1. Intrauterine pregnancy at 38-1/2 weeks estimated gestational age.  2. History of multiple sclerosis with neurologist recommending primary      cesarean delivery.   DISCHARGE DIAGNOSES:  1. Status post low-transverse cesarean section.  2. Viable female infant.   PROCEDURE PERFORMED:  Primary low-transverse cesarean section.   REASON FOR ADMISSION:  Please see dictated H&P.   HOSPITAL COURSE:  The patient is a 30 year old primigravida that was  admitted to [facility] at 38-1/2 weeks estimated  gestational age for a cesarean delivery.  The patient did have history  of multiple sclerosis and her neurologist had recommended that she have  a cesarean delivery.  On the morning of admission, the patient was taken  to the operating room, where spinal anesthesia was administered without  difficulty.  A low-transverse incision was made with delivery of a  viable female infant weighing 5 pounds 14 ounces with Apgar of 8 at one  minute and 9 at five minutes.  Arterial cord pH was 7.33.  The patient  tolerated the procedure well and was taken to the recovery room in  stable condition.   On postoperative day one, the patient was without complaints.  Vital  signs were stable.  She was afebrile.  Abdomen soft and nontender.  Abdominal dressing was noted to be clean, dry and intact.  Laboratory  findings noted hemoglobin of 11.9.  On postoperative day two, the  patient was without complaints.  Vital signs remained stable.  She was  afebrile.  Fundus was firm and nontender.  Incision was clean, dry  and  intact.  She was ambulating well and tolerating a regular diet without  complaints of nausea or vomiting.  On postoperative day three, the  patient was without complaints.  Vital signs remained stable.  She was  afebrile.  Fundus was firm and nontender.  Incision was clean, dry and  intact.  Staples were left intact.  Discharge instructions reviewed and  the patient was later discharged home.   DISCHARGE CONDITION:  Stable.   DISCHARGE DIET:  Regular as tolerated.   DISCHARGE ACTIVITY:  No heavy lifting and no driving x2 weeks.  No  vaginal entry.   FOLLOW UP:  The patient is to follow up in the office in 2-3 days for  staple removal.  To call for temperature greater than 100 degrees,  persistent nausea, vomiting, heavy vaginal bleeding and/or redness or  drainage from the incisional site.   DISCHARGE MEDICATIONS:  1. Tylox #30, one p.o. q.4-6 h. p.r.n.  2. Motrin 600 mg q.6 h.  3. Prenatal vitamins 1 p.o. daily.  4. Colace 1 p.o. daily p.r.n.      Okey Regal  Lyda Jester, N.P.     CC/MEDQ  D:  04/23/2007  T:  04/23/2007  Job:  [id number]

## 2010-09-08 NOTE — Op Note (Signed)
[facility]  Patient:    Jessica Roberson, Jessica Roberson Visit Number: [id number] MRN: [id number]          Service Type: SUR Location: [id number] Attending Physician:  Georgena Spurling Dictated by:   Georgena Spurling, M.D. Proc. Date: 07/09/01 Admit Date:  07/09/2001                             Operative Report  PREOPERATIVE DIAGNOSIS:  Lateral collateral ligament instability.  POSTOPERATIVE DIAGNOSIS:  Lateral collateral ligament instability.  OPERATION PERFORMED:  Lateral collateral ligament reconstruction.  SURGEON:  Georgena Spurling, M.D.  ASSISTANT:  Jamelle Rushing, P.A.  ANESTHESIA:  General.  INDICATIONS FOR PROCEDURE:  The patient is a 30 year old black female status post complete knee dislocation with all four ligaments injured.  She has been through ACL, PCL reconstructions, LCL repair and reconstructions, MCL has been treated with immobilization. She has stretched the lateral collateral ligament reconstruction, necessitated further reconstruction.  Informed consent was obtained.  DESCRIPTION OF PROCEDURE:  The patient was laid supine and administered general anesthesia.  The right lower extremity was prepped and draped in the usual sterile fashion.  The previous incision was excised to revise a keloid type scar.  We then went down to the iliotibial band sharply, put a Weitlaner retractor in place, then incised the iliotibial band identifying the posterior joint capsule and lateral ligamentous structures.  The screw was completely loose and windshield wipered and had formed a bit of a cyst in the bone there. We removed the screw.  We tagged our allograft which seemed to be in very good condition.  One was still in the circumferential loop.  The other was in two strands.  After tagging those, we curetted out the cyst, packed it full of cancellous bone chips and then with C-arm guidance reduced the knee on the lateral compartment and placed a 7.3  mm partially threaded screw in bicortical fashion.  We were able to loop one of the hamstring tendons around the shaft of the screw.  The other we did a Krakow stitch and tied it as if it were a post.  I then used a staple to tamp that hamstring down onto the surface. We did roughen up the cortical surface prior to tightening the screw and placing the staple.  I then reefed the capsule anteriorly down to two 5.5 mm bone anchors and then reefed the anterior capsule back posteriorly with figure-of-eight sutures.  I then irrigated and closed the fascia lata with interrupted #1 Vicryl, deep soft tissue with interrupted 0 Vicryls, subcuticular with 2-0 Vicryls and skin staples.  I then placed a cast on in 20 degrees of flexion with valgus stress and verified its position under C-arm guidance.  The patient tolerated the procedure well.  TOURNIQUET TIME:  One hour 19 minutes.  COMPLICATIONS:  None.  DRAINS:  None. Dictated by:   Georgena Spurling, M.D. Attending Physician:  Georgena Spurling DD:  07/09/01 TD:  07/10/01 Job: [id number]

## 2010-10-31 ENCOUNTER — Ambulatory Visit (INDEPENDENT_AMBULATORY_CARE_PROVIDER_SITE_OTHER): Payer: Medicaid Other | Admitting: Family

## 2010-10-31 ENCOUNTER — Encounter: Payer: Self-pay | Admitting: Family

## 2010-10-31 DIAGNOSIS — F411 Generalized anxiety disorder: Secondary | ICD-10-CM

## 2010-10-31 DIAGNOSIS — K219 Gastro-esophageal reflux disease without esophagitis: Secondary | ICD-10-CM

## 2010-10-31 MED ORDER — ESCITALOPRAM OXALATE 10 MG PO TABS: 10.0000 mg | ORAL_TABLET | Freq: Every day | ORAL | Status: AC

## 2010-10-31 MED ORDER — OMEPRAZOLE-SODIUM BICARBONATE 40-1100 MG PO CAPS: 1.0000 | ORAL_CAPSULE | Freq: Every day | ORAL | Status: DC

## 2010-10-31 MED ORDER — ALPRAZOLAM 0.25 MG PO TABS: 0.2500 mg | ORAL_TABLET | Freq: Three times a day (TID) | ORAL | Status: DC | PRN

## 2010-10-31 NOTE — Patient Instructions (Signed)
Please follow up in 1 month, sooner if problems or concerns. Lexapro (escitalopram 10mg ) 1/2 tablet by mouth once daily for 1 week, then increase to full tablet once daily on week two. Call if worsening depresion, tearfulness.

## 2010-10-31 NOTE — Progress Notes (Signed)
Subjective:    Patient ID: Jessica Roberson, female    DOB: Dec 09, 1980, 30 y.o.   MRN: [id number]  HPI  [name] is a 30 yr old female who presents today for follow up.  Denies current depression.  She reports that she has had 1 panic attack.  She has found herself "picking" her skin again- was up one night until 4PM "picking" one night.  She notes that she has difficulty focusing when she feels anxious. Afraid to take the alprazolam due to fear of becoming "addicted."   GERD- improved on prescription zegerid.   Review of Systems See history of present illness  Past Medical History  Diagnosis Date  . Arthritis   . Depression   . Allergy   . Migraine   . Multiple sclerosis   . Anal fissure   . Obesity   . Ulcer 09/2009  . Colon polyp 2005    History   Social History  . Marital Status: Single    Spouse Name: N/A    Number of Children: N/A  . Years of Education: N/A   Occupational History  . Not on file.   Social History Main Topics  . Smoking status: Never Smoker   . Smokeless tobacco: Not on file  . Alcohol Use: Yes     once a week  . Drug Use: No  . Sexually Active:    Other Topics Concern  . Not on file   Social History Narrative   Currently at [employer]; wants to enroll in RN program.Lives with boyfriendRegular exercise: yes    Past Surgical History  Procedure Date  . Cesarean section   . Cholecystectomy 2007  . Knee arthroscopy     x 3  . Foot surgery     x 2  . Hernia repair 1984  . Hammer toe surgery 2011  . Foot tendon transfer 2004    Family History  Problem Relation Age of Onset  . Stroke Mother   . Kidney disease Mother     ESRD  . Cancer Mother     ovarian, pancreatic,stomach,uterine,thyroid  . Multiple sclerosis Mother   . Depression Mother   . Hypertension Father   . Diabetes Father     type II  . Sarcoidosis Father   . Asthma Daughter   . Hypertension Maternal Grandmother   . Diabetes Maternal  Grandmother   . Diabetes Paternal Grandfather   . Cancer Other     multiple aunts w/ breast cancer    Allergies  Allergen Reactions  . Aspirin   . Cyclobenzaprine Itching  . Hydromorphone Hcl   . Penicillins   . Tramadol Hcl     REACTION: hives    Current Outpatient Prescriptions on File Prior to Visit  Medication Sig Dispense Refill  . diphenhydrAMINE (BENADRYL) 25 mg capsule Take 25 mg by mouth as needed.        . fexofenadine (ALLEGRA) 180 MG tablet Take 1 tablet (180 mg total) by mouth daily.  30 tablet  2  . Fingolimod HCl (GILENYA) 0.5 MG CAPS Take 1 capsule by mouth every other day.       . fluticasone (FLONASE) 50 MCG/ACT nasal spray 2 sprays by Each Nare route daily.        . hydrocodone-acetaminophen (LORCET-HD) 5-500 MG per capsule 1-2 tabs q6h prn pain  20 capsule  0  . levonorgestrel (MIRENA) 20 MCG/24HR IUD 1 each by Intrauterine route once.        Marland Kitchen  metFORMIN (GLUCOPHAGE) 500 MG tablet Take 500 mg by mouth 2 (two) times daily with a meal.        . nortriptyline (PAMELOR) 25 MG capsule Take 25 mg by mouth at bedtime.        . valACYclovir (VALTREX) 500 MG tablet Take 500 mg by mouth 2 (two) times daily as needed.        Marland Kitchen DISCONTD: omeprazole-sodium bicarbonate (ZEGERID) 40-1100 MG per capsule Take 1 capsule by mouth daily.        Marland Kitchen DISCONTD: ALPRAZolam (XANAX) 0.25 MG tablet Take 0.25 mg by mouth 3 (three) times daily as needed. anxiety       . DISCONTD: amantadine (SYMMETREL) 100 MG capsule Take 100 mg by mouth 2 (two) times daily.        Marland Kitchen DISCONTD: cyclobenzaprine (FLEXERIL) 10 MG tablet Take 10 mg by mouth daily.          BP 120/84  Pulse 78  Temp(Src) 98 F (36.7 C) (Oral)  Resp 16  Ht 5\' 8"  (1.727 m)  Wt 301 lb 0.6 oz (136.551 kg)  BMI 45.77 kg/m2       Objective:   Physical Exam  Constitutional: She is oriented to person, place, and time. She appears well-developed and well-nourished.  Neurological: She is alert and oriented to person, place, and  time.  Psychiatric: She has a normal mood and affect. Her behavior is normal. Judgment and thought content normal.          Assessment & Plan:

## 2010-10-31 NOTE — Assessment & Plan Note (Signed)
Clinically improved continue PPI.

## 2010-10-31 NOTE — Assessment & Plan Note (Signed)
I believe she also has a component of OCD. She had some tearfulness initially on the citalopram. She denies ever having had any suicidal ideations on that medication. At this time I think it would be beneficial to give her a trial of different SSRI as this may help with some of her OCD symptoms as well as her overall variety. She is resistant to these the benzodiazepines due to concern about becoming addicted. We will reserve that benzos for when necessary during panic attacks. 20 minutes were spent with the patient today, greater than 50% of this time was spent counseling the patient on her anxiety and obsessive-compulsive disorder.

## 2010-11-13 ENCOUNTER — Encounter (HOSPITAL_BASED_OUTPATIENT_CLINIC_OR_DEPARTMENT_OTHER): Payer: Self-pay | Admitting: *Deleted

## 2010-11-13 ENCOUNTER — Emergency Department (HOSPITAL_BASED_OUTPATIENT_CLINIC_OR_DEPARTMENT_OTHER)
Admission: EM | Admit: 2010-11-13 | Discharge: 2010-11-13 | Disposition: A | Discharge status: Home / Self Care | Payer: Medicaid Other | Attending: Emergency Medicine | Admitting: Emergency Medicine

## 2010-11-13 DIAGNOSIS — G43909 Migraine, unspecified, not intractable, without status migrainosus: Secondary | ICD-10-CM

## 2010-11-13 DIAGNOSIS — G35 Multiple sclerosis: Secondary | ICD-10-CM | POA: Insufficient documentation

## 2010-11-13 DIAGNOSIS — Z8739 Personal history of other diseases of the musculoskeletal system and connective tissue: Secondary | ICD-10-CM | POA: Insufficient documentation

## 2010-11-13 DIAGNOSIS — Z79899 Other long term (current) drug therapy: Secondary | ICD-10-CM | POA: Insufficient documentation

## 2010-11-13 MED ORDER — MORPHINE SULFATE 4 MG/ML IJ SOLN
8.0000 mg | Freq: Once | INTRAMUSCULAR | Status: AC
  Administered 2010-11-13: 8 mg via INTRAMUSCULAR
  Filled 2010-11-13: qty 2

## 2010-11-13 MED ORDER — PROMETHAZINE HCL 25 MG/ML IJ SOLN
25.0000 mg | Freq: Once | INTRAMUSCULAR | Status: AC
  Administered 2010-11-13: 25 mg via INTRAMUSCULAR
  Filled 2010-11-13: qty 1

## 2010-11-13 NOTE — ED Notes (Signed)
Pt reports onset of ha on sat, states she has hx of migraines, but has not had one "in years". N/v this am, her usual migraine symptoms.

## 2010-11-13 NOTE — ED Provider Notes (Signed)
History     Chief Complaint  Patient presents with  . Headache   Patient is a 30 y.o. female presenting with headaches. The history is provided by the patient.  Headache  This is a recurrent problem. The current episode started 2 days ago. The problem occurs constantly. The problem has not changed since onset.The headache is associated with bright light and activity. The pain is located in the frontal region. The quality of the pain is described as throbbing. The pain is at a severity of 8/10. The pain is moderate. The pain does not radiate. Associated symptoms include nausea. Pertinent negatives include no fever and no vomiting. She has tried DHE for the symptoms.    Past Medical History  Diagnosis Date  . Arthritis   . Depression   . Allergy   . Migraine   . Multiple sclerosis   . Anal fissure   . Obesity   . Ulcer 09/2009  . Colon polyp 2005  . Migraine   . Multiple sclerosis     Past Surgical History  Procedure Date  . Cesarean section   . Cholecystectomy 2007  . Knee arthroscopy     x 3  . Foot surgery     x 2  . Hernia repair 1984  . Hammer toe surgery 2011  . Foot tendon transfer 2004    Family History  Problem Relation Age of Onset  . Stroke Mother   . Kidney disease Mother     ESRD  . Cancer Mother     ovarian, pancreatic,stomach,uterine,thyroid  . Multiple sclerosis Mother   . Depression Mother   . Hypertension Father   . Diabetes Father     type II  . Sarcoidosis Father   . Asthma Daughter   . Hypertension Maternal Grandmother   . Diabetes Maternal Grandmother   . Diabetes Paternal Grandfather   . Cancer Other     multiple aunts w/ breast cancer    History  Substance Use Topics  . Smoking status: Never Smoker   . Smokeless tobacco: Not on file  . Alcohol Use: Yes     once a week    OB History    Grav Para Term Preterm Abortions TAB SAB Ect Mult Living                  Review of Systems  Constitutional: Negative for fever.  Eyes:  Negative.   Gastrointestinal: Positive for nausea. Negative for vomiting.  Neurological: Positive for headaches.  All other systems reviewed and are negative.    Physical Exam  BP 144/89  Pulse 81  Temp(Src) 98 F (36.7 C) (Oral)  Resp 20  SpO2 100%  LMP 11/13/2010  Physical Exam  Constitutional: She is oriented to person, place, and time. She appears well-developed and well-nourished. No distress.  HENT:  Head: Normocephalic and atraumatic.  Eyes: Conjunctivae and EOM are normal. Pupils are equal, round, and reactive to light.       No papilledema per fundo exam.  Neurological: She is alert and oriented to person, place, and time. No cranial nerve deficit. She exhibits normal muscle tone. Coordination normal.  Skin: She is not diaphoretic.    ED Course  Procedures  MDM       Geoffery Lyons 11/14/10 1314

## 2010-11-29 ENCOUNTER — Ambulatory Visit: Payer: Medicaid Other | Admitting: Family

## 2010-12-06 ENCOUNTER — Ambulatory Visit: Payer: Medicaid Other | Admitting: Family

## 2010-12-06 DIAGNOSIS — Z0289 Encounter for other administrative examinations: Secondary | ICD-10-CM

## 2010-12-11 ENCOUNTER — Other Ambulatory Visit (HOSPITAL_COMMUNITY)
Admission: RE | Admit: 2010-12-11 | Discharge: 2010-12-11 | Disposition: A | Discharge status: Home / Self Care | Payer: Medicaid Other | Source: Ambulatory Visit | Attending: Obstetrics & Gynecology | Admitting: Obstetrics & Gynecology

## 2010-12-11 ENCOUNTER — Other Ambulatory Visit: Payer: Self-pay | Admitting: Obstetrics & Gynecology

## 2010-12-11 DIAGNOSIS — R87810 Cervical high risk human papillomavirus (HPV) DNA test positive: Secondary | ICD-10-CM | POA: Insufficient documentation

## 2010-12-11 DIAGNOSIS — Z01419 Encounter for gynecological examination (general) (routine) without abnormal findings: Secondary | ICD-10-CM | POA: Insufficient documentation

## 2010-12-11 DIAGNOSIS — Z113 Encounter for screening for infections with a predominantly sexual mode of transmission: Secondary | ICD-10-CM | POA: Insufficient documentation

## 2011-01-22 LAB — PREGNANCY, URINE: Preg Test, Ur: NEGATIVE

## 2011-01-29 LAB — CBC
HCT: 39.5
Hemoglobin: 13.2
MCHC: 33.4
MCHC: 33.7
MCV: 81.5
RBC: 4.35
RDW: 15.1

## 2011-01-30 LAB — COMPREHENSIVE METABOLIC PANEL
ALT: 15
AST: 12
BUN: 4 — ABNORMAL LOW
CO2: 23
CO2: 24
Calcium: 8.8
Chloride: 106
Creatinine, Ser: 0.65
GFR calc Af Amer: >60
GFR calc non Af Amer: >60
Glucose, Bld: 82
Sodium: 135
Total Bilirubin: 0.6
Total Protein: 6.7

## 2011-01-30 LAB — URINALYSIS, ROUTINE W REFLEX MICROSCOPIC
Bilirubin Urine: NEGATIVE
Glucose, UA: NEGATIVE
Glucose, UA: NEGATIVE
Ketones, ur: NEGATIVE
Nitrite: NEGATIVE
Protein, ur: NEGATIVE
Specific Gravity, Urine: <1.005 — ABNORMAL LOW
pH: 6.5

## 2011-01-30 LAB — CBC
HCT: 36.3
MCHC: 34
MCV: 81.3
Platelets: 204
RBC: 4.47
RBC: 4.72
RDW: 15.1
WBC: 6.3

## 2011-01-30 LAB — URINE MICROSCOPIC-ADD ON

## 2011-01-30 LAB — LACTATE DEHYDROGENASE: LDH: 85 — ABNORMAL LOW

## 2011-01-30 LAB — URIC ACID: Uric Acid, Serum: 5.3

## 2011-03-08 ENCOUNTER — Telehealth: Payer: Self-pay | Admitting: Family

## 2011-03-08 NOTE — Telephone Encounter (Signed)
Refill alprazolam 0.25 tablet take 1 tab by mouth 3 times daily for anxiety qty 30 last fill 10-31-10

## 2011-03-09 ENCOUNTER — Encounter: Payer: Self-pay | Admitting: Family

## 2011-03-09 NOTE — Telephone Encounter (Signed)
Left detailed message on cell to call and schedule appt for further refills.

## 2011-03-09 NOTE — Telephone Encounter (Signed)
Refill alprazolam 0.25mg  tab qty 30 take 1 tablet by mouth 3 times daily last fill 10-31-10

## 2011-03-09 NOTE — Telephone Encounter (Signed)
Duplicate phone note. See note of 03/08/11.

## 2011-03-09 NOTE — Telephone Encounter (Signed)
Needs OV prior to refills. 

## 2011-03-09 NOTE — Telephone Encounter (Signed)
Pt last seen in 10/2010 and advised to f/u in 1 month. Pt cancelled and no showed f/u. Has no future appt on file. Alprazolam was last filled in July. Please advise.

## 2011-04-14 DIAGNOSIS — E669 Obesity, unspecified: Secondary | ICD-10-CM | POA: Insufficient documentation

## 2011-04-14 DIAGNOSIS — G35 Multiple sclerosis: Secondary | ICD-10-CM | POA: Insufficient documentation

## 2011-04-14 DIAGNOSIS — Z79899 Other long term (current) drug therapy: Secondary | ICD-10-CM | POA: Insufficient documentation

## 2011-04-14 DIAGNOSIS — G43909 Migraine, unspecified, not intractable, without status migrainosus: Secondary | ICD-10-CM | POA: Insufficient documentation

## 2011-04-15 ENCOUNTER — Encounter (HOSPITAL_BASED_OUTPATIENT_CLINIC_OR_DEPARTMENT_OTHER): Payer: Self-pay | Admitting: *Deleted

## 2011-04-15 ENCOUNTER — Emergency Department (HOSPITAL_BASED_OUTPATIENT_CLINIC_OR_DEPARTMENT_OTHER)
Admission: EM | Admit: 2011-04-15 | Discharge: 2011-04-15 | Disposition: A | Discharge status: Home / Self Care | Payer: Medicaid Other | Attending: Emergency Medicine | Admitting: Emergency Medicine

## 2011-04-15 MED ORDER — SUMATRIPTAN SUCCINATE 6 MG/0.5ML ~~LOC~~ SOLN
6.0000 mg | Freq: Once | SUBCUTANEOUS | Status: AC
  Administered 2011-04-15: 6 mg via SUBCUTANEOUS
  Filled 2011-04-15: qty 0.5

## 2011-04-15 NOTE — ED Provider Notes (Signed)
History     CSN: [id number]  Arrival date & time 04/14/11  2301   First MD Initiated Contact with Patient 04/15/11 0444      Chief Complaint  Patient presents with  . Migraine    (Consider location/radiation/quality/duration/timing/severity/associated sxs/prior treatment) HPI This is a 30 year old black female with a history of migraine headaches. She developed a mild headache yesterday morning which worsened throughout the day. It is now severe. It is associated with nausea, photophobia and hypersensitivity to smell. It was not relieved with her home ibuprofen or Imitrex by mouth. The pain is located in her right for head radiating to the right side of her head. It is throbbing in nature. It is like previous migraines.  Past Medical History  Diagnosis Date  . Arthritis   . Depression   . Allergy   . Migraine   . Multiple sclerosis   . Anal fissure   . Obesity   . Ulcer 09/2009  . Colon polyp 2005  . Migraine   . Multiple sclerosis     Past Surgical History  Procedure Date  . Cesarean section   . Cholecystectomy 2007  . Knee arthroscopy     x 3  . Foot surgery     x 2  . Hernia repair 1984  . Hammer toe surgery 2011  . Foot tendon transfer 2004    Family History  Problem Relation Age of Onset  . Stroke Mother   . Kidney disease Mother     ESRD  . Cancer Mother     ovarian, pancreatic,stomach,uterine,thyroid  . Multiple sclerosis Mother   . Depression Mother   . Hypertension Father   . Diabetes Father     type II  . Sarcoidosis Father   . Asthma Daughter   . Hypertension Maternal Grandmother   . Diabetes Maternal Grandmother   . Diabetes Paternal Grandfather   . Cancer Other     multiple aunts w/ breast cancer    History  Substance Use Topics  . Smoking status: Never Smoker   . Smokeless tobacco: Never Used  . Alcohol Use: 0.5 oz/week    1 drink(s) per week     once a week    OB History    Grav Para Term Preterm Abortions TAB SAB Ect Mult  Living                  Review of Systems  All other systems reviewed and are negative.    Allergies  Aspirin; Cyclobenzaprine; Hydromorphone hcl; Penicillins; and Tramadol hcl  Home Medications   Current Outpatient Rx  Name Route Sig Dispense Refill  . DIAZEPAM PO Oral Take by mouth.      . ALPRAZOLAM 0.25 MG PO TABS Oral Take 1 tablet (0.25 mg total) by mouth 3 (three) times daily as needed. anxiety 30 tablet 0  . CARISOPRODOL 350 MG PO TABS Oral Take 350 mg by mouth 2 (two) times daily.      Marland Kitchen DIPHENHYDRAMINE HCL 25 MG PO CAPS Oral Take 25 mg by mouth as needed.      . ERGOCALCIFEROL 50000 UNITS PO CAPS Oral Take 50,000 Units by mouth once a week.      Marland Kitchen FEXOFENADINE HCL 180 MG PO TABS Oral Take 1 tablet (180 mg total) by mouth daily. 30 tablet 2  . FINGOLIMOD HCL 0.5 MG PO CAPS Oral Take 1 capsule by mouth every other day.     Marland Kitchen FLUTICASONE PROPIONATE 50 MCG/ACT NA SUSP  Each Nare 2 sprays by Each Nare route daily.      Marland Kitchen HYDROCODONE-ACETAMINOPHEN 5-500 MG PO CAPS  1-2 tabs q6h prn pain 20 capsule 0  . LEVONORGESTREL 20 MCG/24HR IU IUD Intrauterine 1 each by Intrauterine route once.      . METFORMIN HCL 500 MG PO TABS Oral Take 500 mg by mouth 2 (two) times daily with a meal.      . NORTRIPTYLINE HCL 25 MG PO CAPS Oral Take 25 mg by mouth at bedtime.      Maxwell Caul BICARBONATE 40-1100 MG PO CAPS Oral Take 1 capsule by mouth daily. 30 capsule 5  . VALACYCLOVIR HCL 500 MG PO TABS Oral Take 500 mg by mouth 2 (two) times daily as needed.        BP 135/75  Pulse 81  Temp(Src) 97.5 F (36.4 C) (Oral)  Resp 18  Ht 5\' 10"  (1.778 m)  Wt 281 lb (127.461 kg)  BMI 40.32 kg/m2  SpO2 100%  Physical Exam General: Well-developed, well-nourished female in no acute distress; appearance consistent with age of record HENT: normocephalic, atraumatic Eyes: pupils equal round and reactive to light; extraocular muscles intact Neck: supple Heart: regular rate and rhythm Lungs:  clear to auscultation bilaterally Abdomen: soft; nondistended Extremities: No deformity; full range of motion Neurologic: Awake, alert and oriented; motor function intact in all extremities and symmetric; no facial droop; normal coordination and speech Skin: Warm and dry Psychiatric: Normal mood and affect    ED Course  Procedures (including critical care time)    MDM  6:13 AM Significant improvement after subcutaneous Imitrex.        Hanley Seamen, MD 04/15/11 [phone]

## 2011-04-15 NOTE — ED Notes (Addendum)
States woke up with headache- went [place] [employer] and sx are worse- hx of mha- took imitrex, benadryl and ibuprofen- vomited x 3

## 2011-07-17 ENCOUNTER — Telehealth: Payer: Self-pay | Admitting: *Deleted

## 2011-07-17 NOTE — Telephone Encounter (Signed)
OK to send 30 day supply of omeprazole 40mg  once daily with no refills.  Also, please send pt a recall letter for follow up apt.

## 2011-07-17 NOTE — Telephone Encounter (Signed)
Received fax from [facility] that zegerid will require prior auth. Spoke with Stacia at [facility] [phone], preferred alternatives are: Omeprazole, pantoprazole, lansoprazole and prilosec otc. Please advise if one of these would be appropriate alternative?  Also, pt was last seen in 10/2010 and advised f/u in 1 month. Pt did not keep appts and has no future appt on file. Pt did not return my previous attempt to contact her to schedule f/u in November.

## 2011-07-18 MED ORDER — OMEPRAZOLE 40 MG PO CPDR: 40.0000 mg | DELAYED_RELEASE_CAPSULE | Freq: Every day | ORAL | Status: DC

## 2011-07-18 NOTE — Telephone Encounter (Signed)
Notified pt, attempted to schedule f/u for 07/30/11 at 2:30pm and system is showing [employer]. Pt reports that her case worker tells her it has been changed. Transferred pt to front office for further direction. Omeprazole rx sent to pharmacy.

## 2011-08-17 ENCOUNTER — Ambulatory Visit (HOSPITAL_COMMUNITY): Payer: Medicaid Other | Attending: Obstetrics & Gynecology

## 2011-09-22 IMAGING — US US PELVIS COMPLETE
1 series · 13 of 25 positions shown · non-contrast
Comparison: None.

CLINICAL DATA: Hirsutism with irregular menses.  Rule out
polycystic ovary disease.  Maternal history of ovarian cancer.  IUD
in place.



[Series 1: us pelvis complete · 0.30mm/px · 13 of 57 slices shown]
[im 1/57]
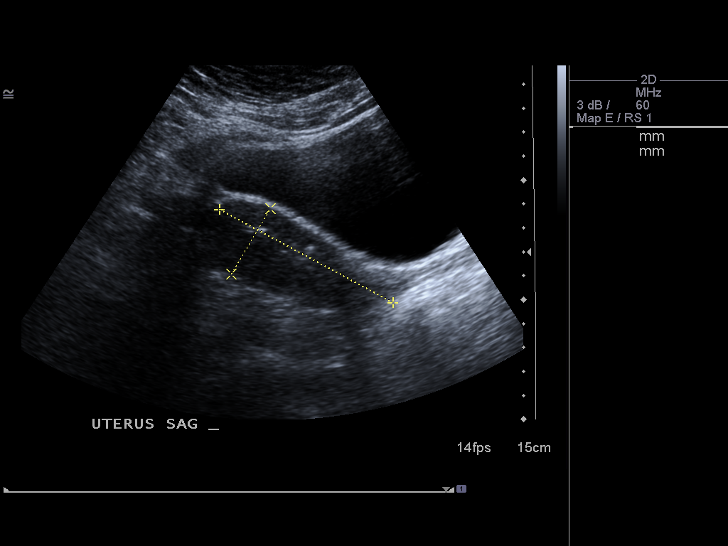
[im 5/57]
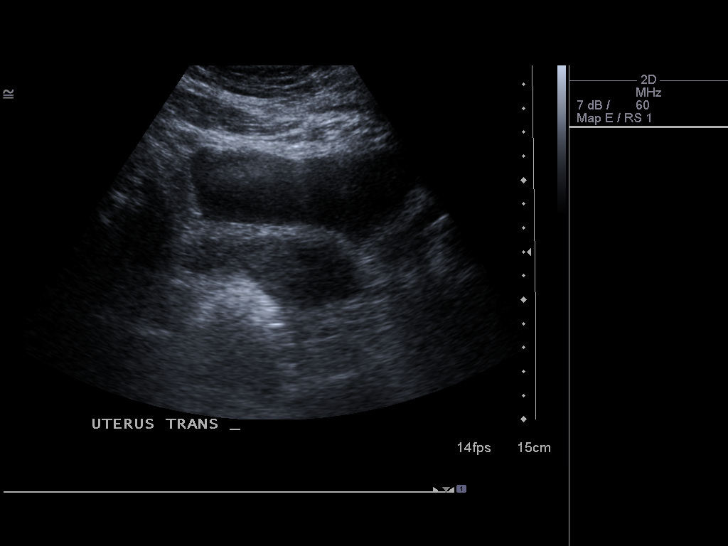
[im 10/57]
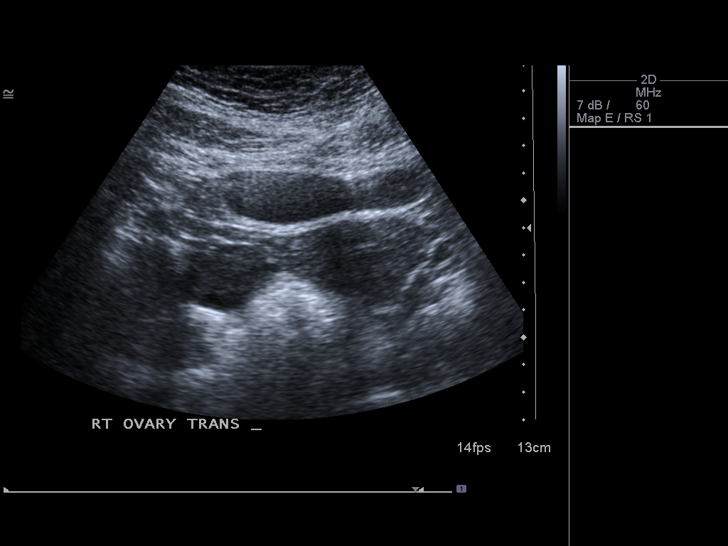
[im 15/57]
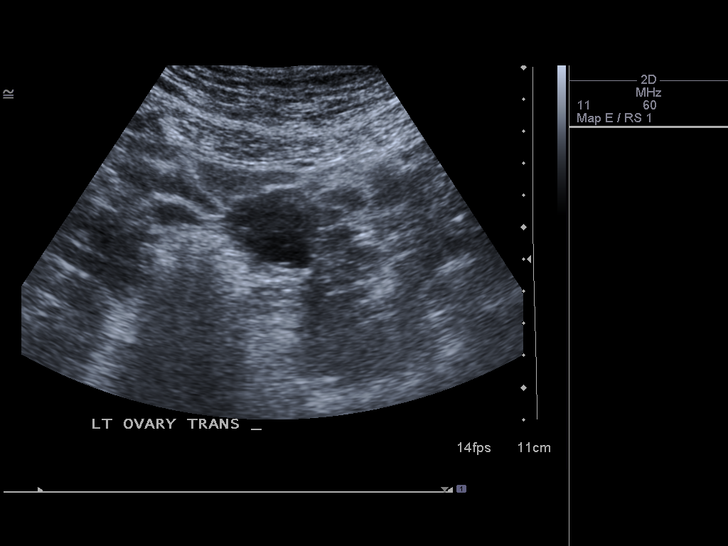
[im 19/57]
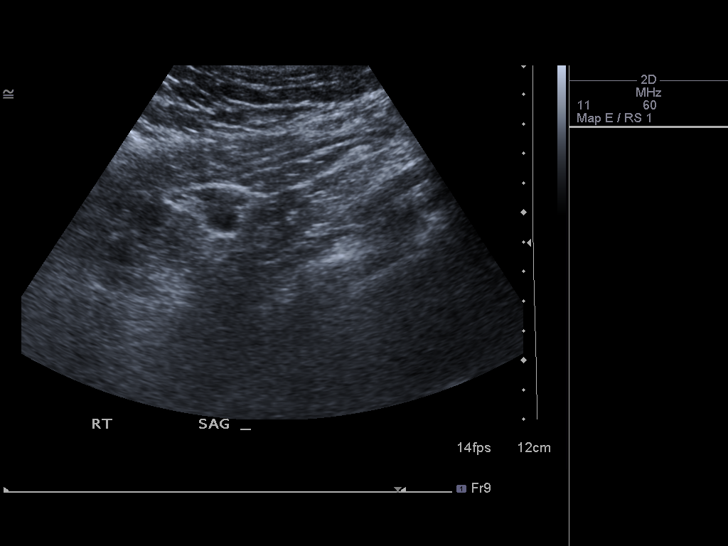
[im 24/57]
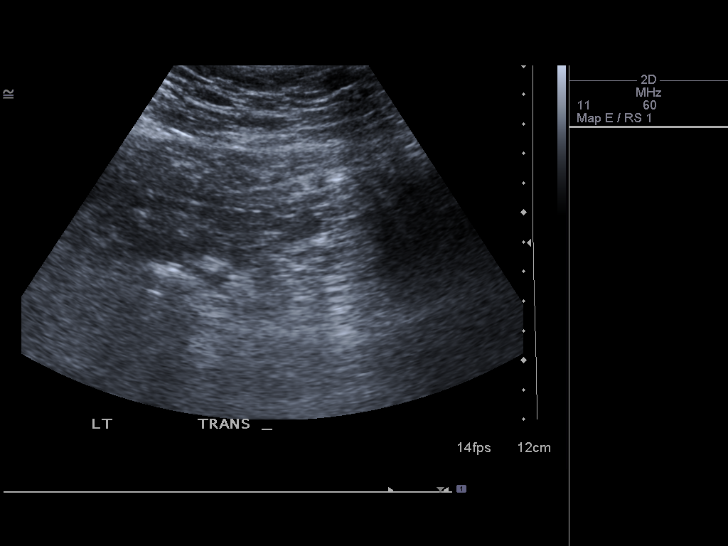
[im 29/57]
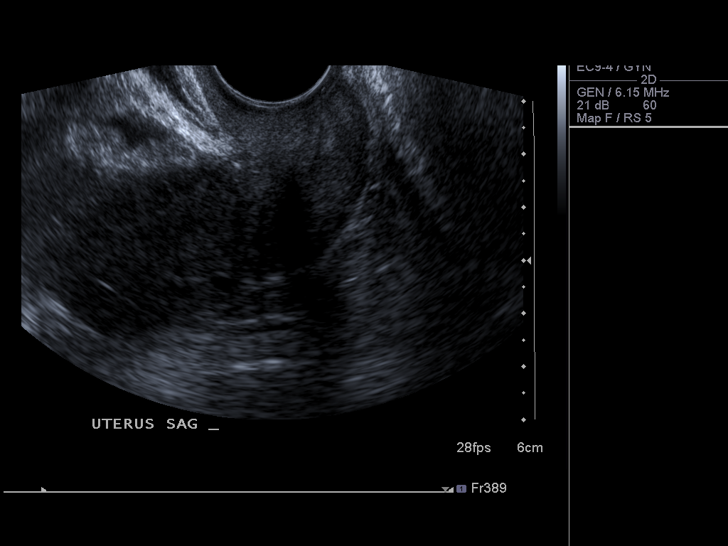
[im 33/57]
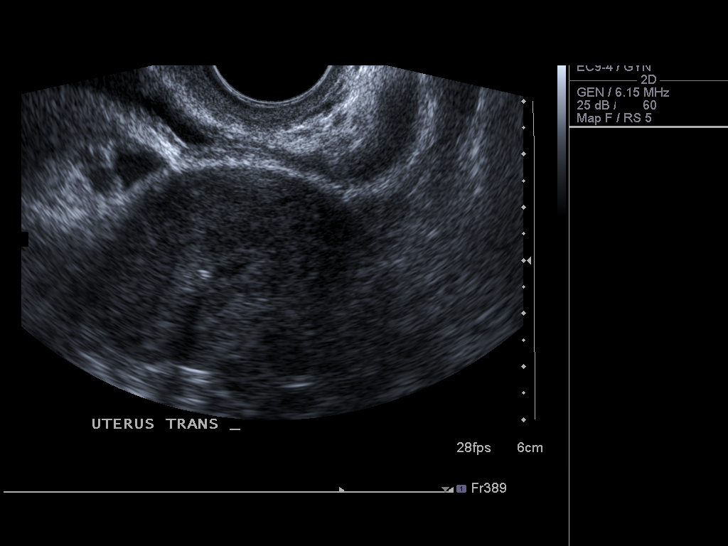
[im 38/57]
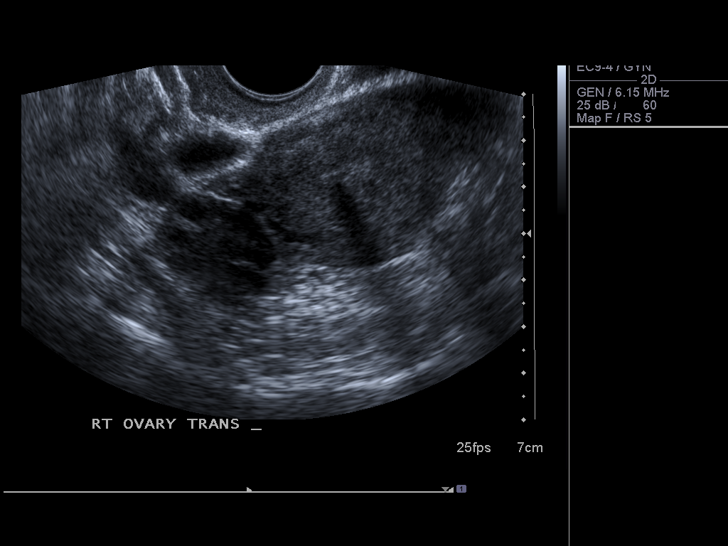
[im 43/57]
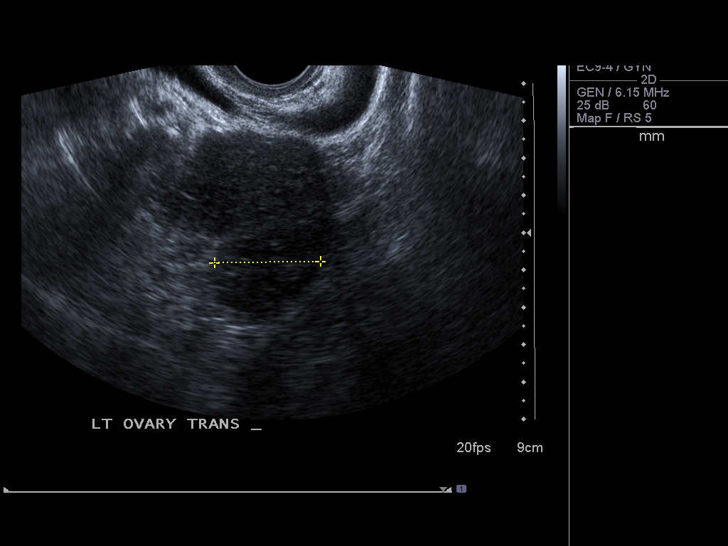
[im 47/57]
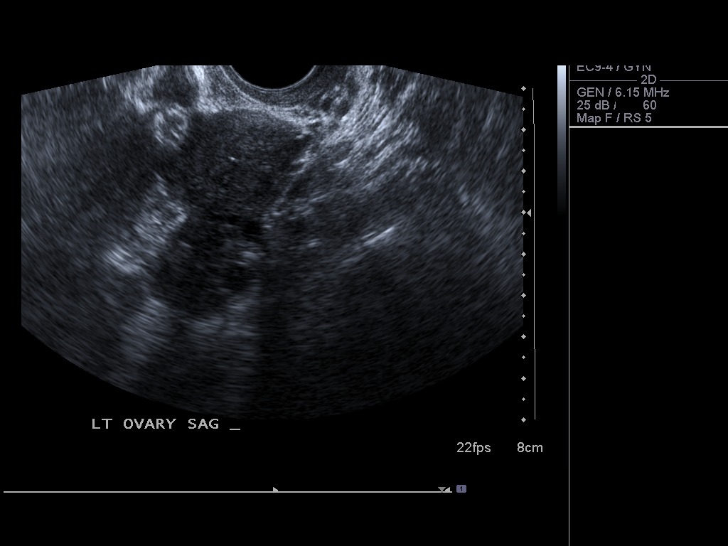
[im 52/57]
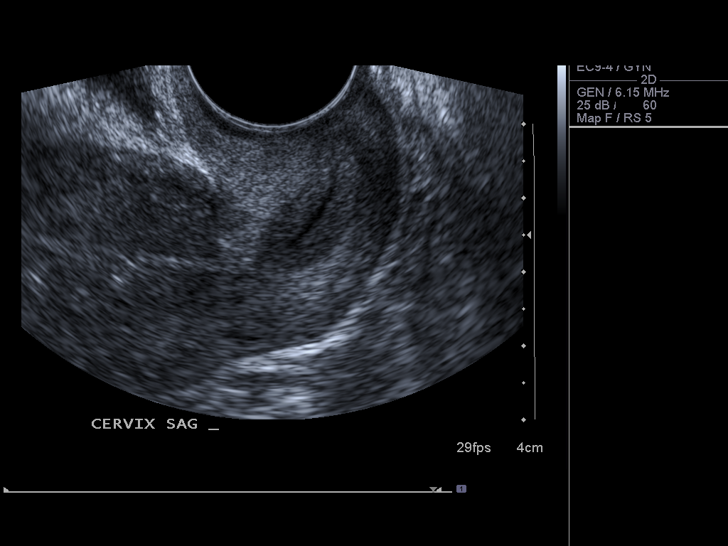
[im 57/57]
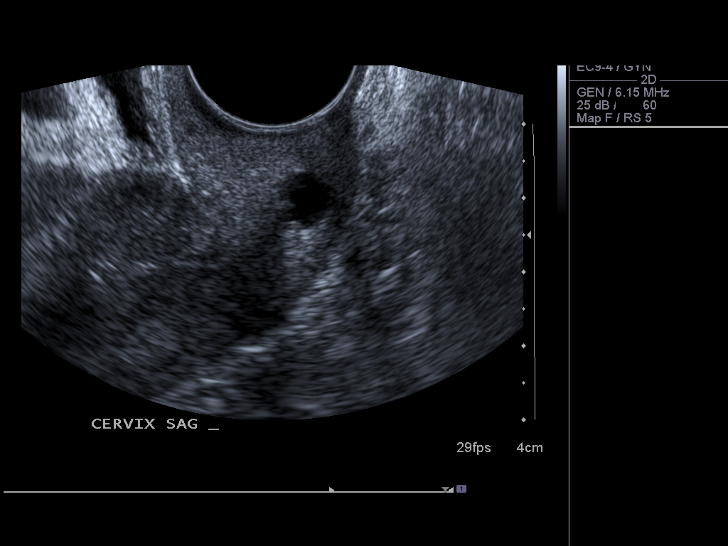

[13 of 25 positions shown; findings below may reference images not displayed]

FINDINGS: Uterus the uterus demonstrates a sagittal length of 6.4 cm, an AP
depth of 3.5 cm and a transverse width of 5.0 cm.  A homogeneous
uterine myometrium is seen.

Endometrium an IUD is in place within the endometrial canal and on
2-D imaging the position appears appropriate with the fundal
portion of the IUD located within the fundal portion of the
endometrial canal.  The endometrial lining appears thin with an AP
width of 4.8 mm.

Right Ovary measures 3.5 x 2.2 x 3.1 cm (11.9 ml).  The ovary
contains more than 12 sub centimeter follicles.  No increased
stromal echogenicity or vascularity is seen.  No focal
abnormalities are suggested

Left Ovary measures 3.1 x 2.1 x 2.8 cm (9.1 ml).  The ovary
contains more than 12 peripherally oriented sub centimeter
follicles.  No increased central echogenicity is seen.  No focal
abnormalities are suggested

Other Findings:  No pelvic fluid or separate adnexal masses are
noted.
IMPRESSION: Normal uterine myometrium and endometrium with the IUD appearing
appropriately positioned on 2-D imaging.

Both ovaries contain more than 12 sub centimeter follicles with the
right ovary having a volume of greater than 10 ml.  The appearance
would correlate sonographically with polycystic ovaries and given
the clinical presentation would correlate with polycystic ovarian
syndrome.

## 2011-10-02 ENCOUNTER — Ambulatory Visit (HOSPITAL_COMMUNITY)
Admission: RE | Admit: 2011-10-02 | Discharge: 2011-10-02 | Disposition: A | Discharge status: Home / Self Care | Payer: Medicaid Other | Source: Ambulatory Visit | Attending: Obstetrics & Gynecology | Admitting: Obstetrics & Gynecology

## 2011-10-02 DIAGNOSIS — G35 Multiple sclerosis: Secondary | ICD-10-CM

## 2011-10-02 NOTE — Progress Notes (Signed)
MFM note  [name] is a 31 yo G2P1011 who is seen for preconception counseling due to history of Multiple Sclerosis. She reports that she was diagnosed with MS in 2006 and her symptoms are primary parasthesias and balance issues.  She is followed by [facility] and her disease course has overall been stable on Gilenya.  In her previous pregnancy, her MS improved while pregnant.  The patient recently had a Mirena IUD removed and she is currently trying to conceive.  POB hx: early TAB, term C-section complicated by elevated blood pressures  PMH- MS, polycystic ovarian syndrome, Migraine headaches  PSH- knee surgery x 3, foot surgery, cholecystectomy, hernia repair  Meds: Gilenya, Soma, Nortriptyline 25 mg qHS  Family hx: negative for birth defects/ hereditary disorders  Assessment/ Plan: 1) Multiple Sclerosis -  Pregnancy is felt to have a protective effect against MS relapses, but there is an increased risk of disease exacerbation in the early post partum period.   MS appears to be associated with a modestly increased risk of cesarean delivery and low birth weight infants.  However, birth outcomes have not been systematically studied.   We reviewed the patients medication profile.  Gilenya has been associated with decreases in fetal viability and causes malformations in animal studies at doses lower than those used clinically, but very limited human data is available.  Would recommend that this be discontinued prior to conception or in early pregnancy.  Soma and Nortriptyline and not expected to increase the risk of fetal anomalies.  Nortriptyline and other TCA anti-depressant medications are associated with neonatal complications that appear to be self-limited and are note associated with long-term neurodevelopmental delays.  Spinal anesthesia has traditionally been avoided due to concerns of exacerbating MS symptoms.  The choice of anesthesia for delivery in mothers with MS should be  based upon obstetrical concerns.  Would recommend an anesthesia consult in the 3rd trimester to discuss labor anesthesia.  Acute attacks of MS during pregnancy are usually treated with IV glucocorticoids.    [name] seems to be comfortable with the fact that she will likely have to discontinue her MS medications during pregnancy.  Would recommend that she touch bases with her Neurologist to determine alternate treatment options - but she seems to understand that there there are likely few alternatives while pregnant.  Recommend that the patient also start Prenatal vitamins for folic acid / prevention of ONTD.  Thank you for the opportunity to be a part of the care for Jessica Roberson.  Please contact our office if we can be of further assistance.    Alpha Gula, MD  I spent approximately 30 minutes with this patient with over 50% of time spent in face-to-face counseling.

## 2011-10-24 ENCOUNTER — Ambulatory Visit: Payer: Medicaid Other | Admitting: Family

## 2012-01-22 ENCOUNTER — Ambulatory Visit (HOSPITAL_COMMUNITY): Payer: Medicaid Other

## 2012-01-24 ENCOUNTER — Emergency Department (HOSPITAL_BASED_OUTPATIENT_CLINIC_OR_DEPARTMENT_OTHER): Payer: Medicaid Other

## 2012-01-24 ENCOUNTER — Encounter (HOSPITAL_BASED_OUTPATIENT_CLINIC_OR_DEPARTMENT_OTHER): Payer: Self-pay | Admitting: *Deleted

## 2012-01-24 ENCOUNTER — Emergency Department (HOSPITAL_BASED_OUTPATIENT_CLINIC_OR_DEPARTMENT_OTHER)
Admission: EM | Admit: 2012-01-24 | Discharge: 2012-01-25 | Disposition: A | Discharge status: Home / Self Care | Payer: Medicaid Other | Attending: Emergency Medicine | Admitting: Emergency Medicine

## 2012-01-24 DIAGNOSIS — R109 Unspecified abdominal pain: Secondary | ICD-10-CM | POA: Insufficient documentation

## 2012-01-24 DIAGNOSIS — M129 Arthropathy, unspecified: Secondary | ICD-10-CM | POA: Insufficient documentation

## 2012-01-24 DIAGNOSIS — N898 Other specified noninflammatory disorders of vagina: Secondary | ICD-10-CM | POA: Insufficient documentation

## 2012-01-24 DIAGNOSIS — F3289 Other specified depressive episodes: Secondary | ICD-10-CM | POA: Insufficient documentation

## 2012-01-24 DIAGNOSIS — O200 Threatened abortion: Secondary | ICD-10-CM | POA: Insufficient documentation

## 2012-01-24 DIAGNOSIS — G35 Multiple sclerosis: Secondary | ICD-10-CM | POA: Insufficient documentation

## 2012-01-24 DIAGNOSIS — Z79899 Other long term (current) drug therapy: Secondary | ICD-10-CM | POA: Insufficient documentation

## 2012-01-24 DIAGNOSIS — F329 Major depressive disorder, single episode, unspecified: Secondary | ICD-10-CM | POA: Insufficient documentation

## 2012-01-24 LAB — BASIC METABOLIC PANEL
BUN: 9 mg/dL (ref 6–23)
Chloride: 99 mEq/L (ref 96–112)
GFR calc Af Amer: >90 mL/min (ref >90)
Glucose, Bld: 84 mg/dL (ref 70–99)
Potassium: 3.8 mEq/L (ref 3.5–5.1)

## 2012-01-24 LAB — CBC
HCT: 33.2 % — ABNORMAL LOW (ref 36.0–46.0)
Hemoglobin: 11.4 g/dL — ABNORMAL LOW (ref 12.0–15.0)
RBC: 4.18 MIL/uL (ref 3.87–5.11)
WBC: 5.1 10*3/uL (ref 4.0–10.5)

## 2012-01-24 LAB — HCG, QUANTITATIVE, PREGNANCY: hCG, Beta Chain, Quant, S: 2545 m[IU]/mL — ABNORMAL HIGH (ref <5)

## 2012-01-24 LAB — WET PREP, GENITAL
Trich, Wet Prep: NONE SEEN
Yeast Wet Prep HPF POC: NONE SEEN

## 2012-01-24 NOTE — ED Notes (Signed)
Pt reports being [redacted] weeks pregnant and started having abdominal cramps, bleeding, and clots since yesterday. Thinks she has had a miscarriage.

## 2012-01-25 LAB — GC/CHLAMYDIA PROBE AMP, GENITAL: GC Probe Amp, Genital: NEGATIVE

## 2012-01-25 NOTE — ED Provider Notes (Signed)
History     CSN: [id number]  Arrival date & time 01/24/12  2147   First MD Initiated Contact with Patient 01/24/12 2259      Chief Complaint  Patient presents with  . Vaginal Bleeding  . Abdominal Cramping    (Consider location/radiation/quality/duration/timing/severity/associated sxs/prior treatment) Patient is a 31 y.o. female presenting with vaginal bleeding and cramps. The history is provided by the patient.  Vaginal Bleeding This is a new problem. The current episode started yesterday. The problem occurs constantly. The problem has not changed since onset.Associated symptoms include abdominal pain. Nothing aggravates the symptoms. Nothing relieves the symptoms. She has tried nothing for the symptoms. The treatment provided no relief.  Abdominal Cramping The primary symptoms of the illness include abdominal pain and vaginal bleeding. The primary symptoms of the illness do not include nausea or vomiting. The current episode started yesterday. The onset of the illness was sudden. The problem has not changed since onset. The abdominal pain began yesterday. The pain came on suddenly. The abdominal pain has been unchanged since its onset. The abdominal pain is located in the suprapubic region. The abdominal pain does not radiate. The abdominal pain is relieved by nothing. Exacerbated by: nothing.  Vaginal bleeding was first noticed yesterday. Vaginal bleeding other than menses is a new problem. Vaginal bleeding is unchanged since it began.    Past Medical History  Diagnosis Date  . Arthritis   . Depression   . Allergy   . Migraine   . Multiple sclerosis   . Anal fissure   . Obesity   . Ulcer 09/2009  . Colon polyp 2005  . Migraine   . Multiple sclerosis     Past Surgical History  Procedure Date  . Cesarean section   . Cholecystectomy 2007  . Knee arthroscopy     x 3  . Foot surgery     x 2  . Hernia repair 1984  . Hammer toe surgery 2011  . Foot tendon transfer 2004     Family History  Problem Relation Age of Onset  . Stroke Mother   . Kidney disease Mother     ESRD  . Cancer Mother     ovarian, pancreatic,stomach,uterine,thyroid  . Multiple sclerosis Mother   . Depression Mother   . Hypertension Father   . Diabetes Father     type II  . Sarcoidosis Father   . Asthma Daughter   . Hypertension Maternal Grandmother   . Diabetes Maternal Grandmother   . Diabetes Paternal Grandfather   . Cancer Other     multiple aunts w/ breast cancer    History  Substance Use Topics  . Smoking status: Never Smoker   . Smokeless tobacco: Never Used  . Alcohol Use: 0.5 oz/week    1 drink(s) per week     once a week    OB History    Grav Para Term Preterm Abortions TAB SAB Ect Mult Living   1               Review of Systems  Gastrointestinal: Positive for abdominal pain. Negative for nausea and vomiting.  Genitourinary: Positive for vaginal bleeding.  All other systems reviewed and are negative.    Allergies  Aspirin; Cyclobenzaprine; Hydromorphone hcl; Penicillins; and Tramadol hcl  Home Medications   Current Outpatient Rx  Name Route Sig Dispense Refill  . ALPRAZOLAM 0.25 MG PO TABS Oral Take 1 tablet (0.25 mg total) by mouth 3 (three) times daily as needed.  anxiety 30 tablet 0  . CARISOPRODOL 350 MG PO TABS Oral Take 350 mg by mouth 2 (two) times daily.      Marland Kitchen DIAZEPAM PO Oral Take by mouth.      Marland Kitchen DIPHENHYDRAMINE HCL 25 MG PO CAPS Oral Take 25 mg by mouth as needed.      . ERGOCALCIFEROL 50000 UNITS PO CAPS Oral Take 50,000 Units by mouth once a week.      Marland Kitchen FEXOFENADINE HCL 180 MG PO TABS Oral Take 1 tablet (180 mg total) by mouth daily. 30 tablet 2  . FINGOLIMOD HCL 0.5 MG PO CAPS Oral Take 1 capsule by mouth every other day.     Marland Kitchen FLUTICASONE PROPIONATE 50 MCG/ACT NA SUSP Each Nare 2 sprays by Each Nare route daily.      Marland Kitchen HYDROCODONE-ACETAMINOPHEN 5-500 MG PO CAPS  1-2 tabs q6h prn pain 20 capsule 0  . LEVONORGESTREL 20 MCG/24HR IU  IUD Intrauterine 1 each by Intrauterine route once.      . METFORMIN HCL 500 MG PO TABS Oral Take 500 mg by mouth 2 (two) times daily with a meal.      . NORTRIPTYLINE HCL 25 MG PO CAPS Oral Take 25 mg by mouth at bedtime.      . OMEPRAZOLE 40 MG PO CPDR Oral Take 1 capsule (40 mg total) by mouth daily. 30 capsule 0  . VALACYCLOVIR HCL 500 MG PO TABS Oral Take 500 mg by mouth 2 (two) times daily as needed.        BP 146/106  Temp 98.5 F (36.9 C) (Oral)  Resp 20  Ht 5\' 9"  (1.753 m)  Wt 272 lb (123.378 kg)  BMI 40.17 kg/m2  SpO2 100%  LMP 11/27/2011  Physical Exam  Constitutional: She appears well-developed and well-nourished.  HENT:  Head: Normocephalic and atraumatic.  Eyes: Conjunctivae normal are normal. Pupils are equal, round, and reactive to light.  Neck: Neck supple.  Cardiovascular: Normal rate and regular rhythm.   Pulmonary/Chest: Effort normal and breath sounds normal.  Abdominal: Soft. Bowel sounds are normal. There is no tenderness. There is no rebound and no guarding.  Genitourinary: No vaginal discharge found.       Os closed scant vaginal discharge  Musculoskeletal: Normal range of motion.  Neurological: She is alert.  Skin: Skin is warm and dry.  Psychiatric: She has a normal mood and affect.    ED Course  Procedures (including critical care time)  Labs Reviewed  PREGNANCY, URINE - Abnormal; Notable for the following:    Preg Test, Ur POSITIVE (*)     All other components within normal limits  HCG, QUANTITATIVE, PREGNANCY - Abnormal; Notable for the following:    hCG, Beta Chain, Quant, S 2545 (*)     All other components within normal limits  CBC - Abnormal; Notable for the following:    Hemoglobin 11.4 (*)     HCT 33.2 (*)     All other components within normal limits  WET PREP, GENITAL - Abnormal; Notable for the following:    WBC, Wet Prep HPF POC FEW (*)     All other components within normal limits  BASIC METABOLIC PANEL  ABO/RH    GC/CHLAMYDIA PROBE AMP, GENITAL   US Ob Comp Less 14 Wks  01/25/2012  *RADIOLOGY REPORT*  Clinical Data: Positive pregnancy test with worsening vaginal bleeding.  OBSTETRIC <14 WK [place] AND TRANSVAGINAL OB US  Technique:  Both transabdominal and transvaginal ultrasound examinations were  performed for complete evaluation of the gestation as well as the maternal uterus, adnexal regions, and pelvic cul-de-sac.  Transvaginal technique was performed to assess early pregnancy.  Comparison:  None.  Intrauterine gestational sac:  Visualized Yolk sac: Visualized Embryo: Visualized Cardiac Activity: Not visualized Heart Rate: N/A bpm  MSD: 10.5 mm  5 w 6 d [place] EDC: 09/19/2012  Maternal uterus/adnexae: There appears to be a tiny amount of fluid in the lower endometrial canal and possibly within the cervical canal.  The maternal ovaries are sonographically normal bilaterally.  There appears to be a tiny amount of free fluid in the pelvis.  IMPRESSION: Single intrauterine gestation identified at estimated 5-week-6-day gestational age by mean sac diameter.  The sonographer documented a crown-rump length of 3 mm.  No heart beat is visible within the embryo, raising concern for a failed pregnancy. Serial beta HCG and follow-up ultrasound in 7-10 days may be helpful to assess for appropriate pregnancy progression.   Original Report Authenticated By: ERIC A. MANSELL, M.D.    US Ob Transvaginal  01/25/2012  *RADIOLOGY REPORT*  Clinical Data: Positive pregnancy test with worsening vaginal bleeding.  OBSTETRIC <14 WK [place] AND TRANSVAGINAL OB US  Technique:  Both transabdominal and transvaginal ultrasound examinations were performed for complete evaluation of the gestation as well as the maternal uterus, adnexal regions, and pelvic cul-de-sac.  Transvaginal technique was performed to assess early pregnancy.  Comparison:  None.  Intrauterine gestational sac:  Visualized Yolk sac: Visualized Embryo: Visualized Cardiac Activity: Not  visualized Heart Rate: N/A bpm  MSD: 10.5 mm  5 w 6 d [place] EDC: 09/19/2012  Maternal uterus/adnexae: There appears to be a tiny amount of fluid in the lower endometrial canal and possibly within the cervical canal.  The maternal ovaries are sonographically normal bilaterally.  There appears to be a tiny amount of free fluid in the pelvis.  IMPRESSION: Single intrauterine gestation identified at estimated 5-week-6-day gestational age by mean sac diameter.  The sonographer documented a crown-rump length of 3 mm.  No heart beat is visible within the embryo, raising concern for a failed pregnancy. Serial beta HCG and follow-up ultrasound in 7-10 days may be helpful to assess for appropriate pregnancy progression.   Original Report Authenticated By: ERIC A. MANSELL, M.D.      No diagnosis found.    MDM  113 case d/w Dr. Luiz Blare of [facility], patient's OB group.  Rest/ pelvic rest and follow up in office on Monday.  Return to the closest ED for soaking >1 pad every 20 minutes.  Patient verbalizes understanding and agrees to follow up        Keagon Glascoe Smitty Cords, MD 01/25/12 1610

## 2012-04-24 ENCOUNTER — Ambulatory Visit (INDEPENDENT_AMBULATORY_CARE_PROVIDER_SITE_OTHER): Payer: BC Managed Care – PPO | Admitting: Obstetrics and Gynecology

## 2012-04-24 ENCOUNTER — Ambulatory Visit (INDEPENDENT_AMBULATORY_CARE_PROVIDER_SITE_OTHER): Payer: BC Managed Care – PPO

## 2012-04-24 ENCOUNTER — Encounter: Payer: Self-pay | Admitting: Obstetrics and Gynecology

## 2012-04-24 ENCOUNTER — Other Ambulatory Visit: Payer: Self-pay | Admitting: Obstetrics and Gynecology

## 2012-04-24 VITALS — BP 106/68 | Wt 284.0 lb

## 2012-04-24 DIAGNOSIS — O26849 Uterine size-date discrepancy, unspecified trimester: Secondary | ICD-10-CM

## 2012-04-24 DIAGNOSIS — O200 Threatened abortion: Secondary | ICD-10-CM

## 2012-04-24 NOTE — Progress Notes (Unsigned)
Pt presented for NOB interview. States LMP 03/13/12.  States saw OB/GYN in [place].  Had bleeding 3 weeks ago and has been having brown spotting since then. No pain. Had SAB 01/2012. States at U/S 2 weeks ago was told it was too soon to tell status of pregnancy.  Per Dr ND, U/S  To be done and to be reviewed by Dr ND.  Pt states is Rh neg and has not received Rhogam.  Dr ND seeing pt today.

## 2012-04-24 NOTE — Progress Notes (Signed)
Pt is here today for a F/U u/s for viability .pt states she has been bleeding for three weeks.  She went to her OB/GYN two weeks ago and had an [place] but was not able to give info on the viability of the pregnancy.  She states a fetal pole was seen.  She has been spotting.  She had a miscarriage in 10/13.   PMH sig for MS.  Pt has been off of meds since September BP 106/68  Wt 284 lb (128.822 kg)  LMP 11/27/2011 Physical Examination: General appearance - alert, well appearing, and in no distress Chest - clear to auscultation, no wheezes, rales or rhonchi, symmetric air entry Heart - normal rate and regular rhythm Abdomen - soft, nontender, nondistended, no masses or organomegaly Pelvic - normal external genitalia, vulva, vagina, cervix, uterus and adnexa, spotting cervix is closed Extremities - peripheral pulses normal, no pedal edema, no clubbing or cyanosis Korea 10 week size irregular sac and placenta seen .  No FP seen threatened abortion [place] highly suggestive of irregular pregnancy Pt offered serial BHCG, obs, cytotec or D&E R&B of all reviewed with the pt She chose beta quant today and Saturday Follow up with me in one week with repeat US Bleeding precautions given to pt Rhogam on Saturday with quant.

## 2012-04-25 LAB — US OB TRANSVAGINAL

## 2012-04-25 LAB — HCG, QUANTITATIVE, PREGNANCY: hCG, Beta Chain, Quant, S: 12858.3 m[IU]/mL

## 2012-04-26 ENCOUNTER — Inpatient Hospital Stay (HOSPITAL_COMMUNITY)
Admission: AD | Admit: 2012-04-26 | Discharge: 2012-04-26 | Disposition: A | Discharge status: Home / Self Care | Payer: BC Managed Care – PPO | Source: Ambulatory Visit | Attending: Obstetrics and Gynecology | Admitting: Obstetrics and Gynecology

## 2012-04-26 ENCOUNTER — Encounter: Payer: Self-pay | Admitting: Obstetrics and Gynecology

## 2012-04-26 DIAGNOSIS — O200 Threatened abortion: Secondary | ICD-10-CM | POA: Insufficient documentation

## 2012-04-26 LAB — HCG, QUANTITATIVE, PREGNANCY: hCG, Beta Chain, Quant, S: 9945 m[IU]/mL — ABNORMAL HIGH (ref <5)

## 2012-04-26 MED ORDER — RHO D IMMUNE GLOBULIN 1500 UNIT/2ML IJ SOLN
300.0000 ug | Freq: Once | INTRAMUSCULAR | Status: DC
  Filled 2012-04-26: qty 2

## 2012-04-26 NOTE — Progress Notes (Signed)
Patient ID: Jessica Roberson, female   DOB: [birth year], 32 y.o.   MRN: [id number] States little bleeding that is brown excepted this was a miscarriage, always thought I was O pos but told O neg in Oct. Presents for F/O quant HCG and rhophylac, discussed declining HCG numbers and O pos result now and in Oct, verbalized understanding. Has f/o with [place] Friday [facility]. S/s bleeding and pain to report reveiwed.  Lavera Guise, CNM

## 2012-04-26 NOTE — ED Notes (Signed)
Pt here for follow up BHCG. M.Krebsbach,CNM discussed POC and results with pt. Pt blood type is O+. Did not need ryphalac injection.

## 2012-04-28 ENCOUNTER — Telehealth: Payer: Self-pay | Admitting: Obstetrics and Gynecology

## 2012-04-28 NOTE — Progress Notes (Signed)
See last progress note.

## 2012-04-29 ENCOUNTER — Telehealth: Payer: Self-pay

## 2012-04-29 NOTE — Telephone Encounter (Signed)
Message copied by Rolla Plate on Tue Apr 29, 2012 11:20 AM ------      Message from: Jaymes Graff      Created: Mon Apr 28, 2012  9:29 PM       Please tell the pt that her declining HCg and Korea is c/w miscarriage.  Her options are      1. Observation and pass this on her own.  F/u in 2 weeks      2. cytotec to help pass the tissue      3. D&E      We can discuss at her office visit, unless she knows what decision she wants to make now.

## 2012-04-29 NOTE — Telephone Encounter (Signed)
Spoke with pt informed labs informed c/w miscarriage pt wants D&E informed pt will inform ND and surgery coordinator will call to schd pt voice understanding

## 2012-04-29 NOTE — Telephone Encounter (Signed)
Lm on vm tcb rgd labs 

## 2012-05-01 ENCOUNTER — Other Ambulatory Visit: Payer: Self-pay | Admitting: Obstetrics and Gynecology

## 2012-05-01 ENCOUNTER — Telehealth: Payer: Self-pay | Admitting: Obstetrics and Gynecology

## 2012-05-01 NOTE — Telephone Encounter (Signed)
D&E rescheduled to 05/05/12 @ 10:30am with SR.  -Adrianne Pridgen

## 2012-05-01 NOTE — H&P (Signed)
   Reason for admission:   Missed ab History:     Jessica Roberson is a 32 y.o. female, G1P0, presenting today to undergo  D &E for missed abortion at 10 + weeks of gestation. Seen and fully evaluated by Dr Normand Sloop 04/24/12  Review of system:  Non-contributory   Past Medical History:   Past Medical History  Diagnosis Date  . Arthritis   . Depression   . Allergy   . Migraine   . Multiple sclerosis   . Anal fissure   . Obesity   . Ulcer 09/2009  . Colon polyp 2005  . Migraine   . Multiple sclerosis    Blood type: O +   Allergies:   Allergies  Allergen Reactions  . Aspirin   . Cyclobenzaprine Itching  . Hydromorphone Hcl   . Penicillins   . Tramadol Hcl     REACTION: hives    Social History:   History   Social History  . Marital Status: Married    Spouse Name: N/A    Number of Children: N/A  . Years of Education: N/A   Social History Main Topics  . Smoking status: Never Smoker   . Smokeless tobacco: Never Used  . Alcohol Use: 0.5 oz/week    1 drink(s) per week     Comment: once a week  . Drug Use: No  . Sexually Active: Yes    Birth Control/ Protection: None     Comment: preganat   Other Topics Concern  . 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 Stroke Mother   . Kidney disease Mother     ESRD  . Cancer Mother     ovarian, pancreatic,stomach,uterine,thyroid  . Multiple sclerosis Mother   . Depression Mother   . Hypertension Father   . Diabetes Father     type II  . Sarcoidosis Father   . Asthma Daughter   . Hypertension Maternal Grandmother   . Diabetes Maternal Grandmother   . Diabetes Paternal Grandfather   . Cancer Other     multiple aunts w/ breast cancer    Physical exam: per Dr Normand Sloop 04/24/12    General Appearance: Alert, appropriate  appearance for age. No acute distress Neck / Thyroid: Supple, no masses, nodes or enlargement Lungs: clear to auscultation bilaterally Back: No CVA tenderness. Cardiovascular: Regular rate and rhythm. S1, S2, no murmur Gastrointestinal: Soft, non-tender, no masses or organomegaly Pelvic Exam: VV normal. Cervix spotting and closed. Uterus and adnexa unremarkable   Assessment:   Missed ab at 10 + weeks   Plan:    Proceed with D & E under IV sedation. Procedure, risks and benefits reviewed with patient including but not limited to bleeding, infection, injury to uterus and retained products of conception. Patient voices understanding and is agreeable to proceed.

## 2012-05-01 NOTE — Telephone Encounter (Signed)
D&E rescheduled to 05/05/12 @ 10:30am with [facility].  -Adrianne Pridgen

## 2012-05-01 NOTE — Telephone Encounter (Signed)
D&E scheduled for 05/02/12 @ 1:00pm with [facility].  -Adrianne Pridgen

## 2012-05-02 ENCOUNTER — Encounter: Payer: BC Managed Care – PPO | Admitting: *Deleted

## 2012-05-02 ENCOUNTER — Other Ambulatory Visit: Payer: BC Managed Care – PPO

## 2012-05-05 ENCOUNTER — Ambulatory Visit (HOSPITAL_COMMUNITY): Payer: BC Managed Care – PPO | Admitting: Anesthesiology

## 2012-05-05 ENCOUNTER — Encounter (HOSPITAL_COMMUNITY): Payer: Self-pay | Admitting: Anesthesiology

## 2012-05-05 ENCOUNTER — Encounter (HOSPITAL_COMMUNITY)
Admission: RE | Disposition: A | Discharge status: Home / Self Care | Payer: Self-pay | Source: Ambulatory Visit | Attending: Obstetrics and Gynecology

## 2012-05-05 ENCOUNTER — Encounter (HOSPITAL_COMMUNITY): Payer: Self-pay | Admitting: *Deleted

## 2012-05-05 ENCOUNTER — Ambulatory Visit (HOSPITAL_COMMUNITY)
Admission: RE | Admit: 2012-05-05 | Discharge: 2012-05-05 | Disposition: A | Discharge status: Home / Self Care | Payer: BC Managed Care – PPO | Source: Ambulatory Visit | Attending: Obstetrics and Gynecology | Admitting: Obstetrics and Gynecology

## 2012-05-05 DIAGNOSIS — O021 Missed abortion: Secondary | ICD-10-CM | POA: Insufficient documentation

## 2012-05-05 HISTORY — DX: Polycystic ovarian syndrome: E28.2

## 2012-05-05 HISTORY — PX: DILATION AND EVACUATION: SHX1459

## 2012-05-05 LAB — CBC
HCT: 35.5 % — ABNORMAL LOW (ref 36.0–46.0)
Hemoglobin: 12 g/dL (ref 12.0–15.0)
MCH: 26.7 pg (ref 26.0–34.0)
MCHC: 33.8 g/dL (ref 30.0–36.0)
MCV: 78.9 fL (ref 78.0–100.0)
Platelets: 222 10*3/uL (ref 150–400)
RBC: 4.5 MIL/uL (ref 3.87–5.11)
RDW: 15 % (ref 11.5–15.5)
WBC: 3.8 10*3/uL — ABNORMAL LOW (ref 4.0–10.5)

## 2012-05-05 SURGERY — DILATION AND EVACUATION, UTERUS
Anesthesia: Monitor Anesthesia Care | Site: Vagina | Wound class: Clean Contaminated

## 2012-05-05 MED ORDER — FENTANYL CITRATE 0.05 MG/ML IJ SOLN
INTRAMUSCULAR | Status: AC
  Administered 2012-05-05: 50 ug via INTRAVENOUS
  Filled 2012-05-05: qty 2

## 2012-05-05 MED ORDER — LACTATED RINGERS IV SOLN
INTRAVENOUS | Status: DC
  Administered 2012-05-05: 125 mL/h via INTRAVENOUS

## 2012-05-05 MED ORDER — PROPOFOL 10 MG/ML IV EMUL
INTRAVENOUS | Status: AC
  Filled 2012-05-05: qty 20

## 2012-05-05 MED ORDER — PROPOFOL INFUSION 10 MG/ML OPTIME
INTRAVENOUS | Status: DC | PRN
  Administered 2012-05-05: 200 ug/kg/min via INTRAVENOUS

## 2012-05-05 MED ORDER — MIDAZOLAM HCL 5 MG/5ML IJ SOLN
INTRAMUSCULAR | Status: DC | PRN
  Administered 2012-05-05: 2 mg via INTRAVENOUS

## 2012-05-05 MED ORDER — MIDAZOLAM HCL 2 MG/2ML IJ SOLN: 0.5000 mg | Freq: Once | INTRAMUSCULAR | Status: DC | PRN

## 2012-05-05 MED ORDER — FENTANYL CITRATE 0.05 MG/ML IJ SOLN
25.0000 ug | INTRAMUSCULAR | Status: DC | PRN
  Administered 2012-05-05: 50 ug via INTRAVENOUS

## 2012-05-05 MED ORDER — FENTANYL CITRATE 0.05 MG/ML IJ SOLN
INTRAMUSCULAR | Status: AC
  Filled 2012-05-05: qty 2

## 2012-05-05 MED ORDER — DEXAMETHASONE SODIUM PHOSPHATE 4 MG/ML IJ SOLN
INTRAMUSCULAR | Status: DC | PRN
  Administered 2012-05-05: 10 mg via INTRAVENOUS

## 2012-05-05 MED ORDER — LIDOCAINE HCL (CARDIAC) 20 MG/ML IV SOLN
INTRAVENOUS | Status: DC | PRN
  Administered 2012-05-05: 80 mg via INTRAVENOUS

## 2012-05-05 MED ORDER — DEXAMETHASONE SODIUM PHOSPHATE 10 MG/ML IJ SOLN
INTRAMUSCULAR | Status: AC
  Filled 2012-05-05: qty 1

## 2012-05-05 MED ORDER — CHLOROPROCAINE HCL 1 % IJ SOLN
INTRAMUSCULAR | Status: DC | PRN
  Administered 2012-05-05: 20 mL

## 2012-05-05 MED ORDER — PROMETHAZINE HCL 25 MG/ML IJ SOLN: 6.2500 mg | INTRAMUSCULAR | Status: DC | PRN

## 2012-05-05 MED ORDER — MIDAZOLAM HCL 2 MG/2ML IJ SOLN
INTRAMUSCULAR | Status: AC
  Filled 2012-05-05: qty 2

## 2012-05-05 MED ORDER — LIDOCAINE HCL (CARDIAC) 20 MG/ML IV SOLN
INTRAVENOUS | Status: AC
  Filled 2012-05-05: qty 5

## 2012-05-05 MED ORDER — ONDANSETRON HCL 4 MG/2ML IJ SOLN
INTRAMUSCULAR | Status: DC | PRN
  Administered 2012-05-05: 4 mg via INTRAVENOUS

## 2012-05-05 MED ORDER — FENTANYL CITRATE 0.05 MG/ML IJ SOLN
INTRAMUSCULAR | Status: DC | PRN
  Administered 2012-05-05 (×2): 50 ug via INTRAVENOUS

## 2012-05-05 MED ORDER — ONDANSETRON HCL 4 MG/2ML IJ SOLN
INTRAMUSCULAR | Status: AC
  Filled 2012-05-05: qty 2

## 2012-05-05 MED ORDER — ACETAMINOPHEN 10 MG/ML IV SOLN
INTRAVENOUS | Status: AC
  Administered 2012-05-05: 1000 mg via INTRAVENOUS
  Filled 2012-05-05: qty 100

## 2012-05-05 MED ORDER — MEPERIDINE HCL 25 MG/ML IJ SOLN: 6.2500 mg | INTRAMUSCULAR | Status: DC | PRN

## 2012-05-05 MED ORDER — 0.9 % SODIUM CHLORIDE (POUR BTL) OPTIME
TOPICAL | Status: DC | PRN
  Administered 2012-05-05: 1000 mL

## 2012-05-05 SURGICAL SUPPLY — 20 items
CATH ROBINSON RED A/P 16FR (CATHETERS) ×2 IMPLANT
CLOTH BEACON ORANGE TIMEOUT ST (SAFETY) ×2 IMPLANT
DECANTER SPIKE VIAL GLASS SM (MISCELLANEOUS) ×2 IMPLANT
GLOVE BIOGEL PI IND STRL 7.0 (GLOVE) ×2 IMPLANT
GLOVE BIOGEL PI INDICATOR 7.0 (GLOVE) ×2
GOWN STRL REIN XL XLG (GOWN DISPOSABLE) ×4 IMPLANT
KIT BERKELEY 1ST TRIMESTER 3/8 (MISCELLANEOUS) ×2 IMPLANT
NDL SPNL 22GX3.5 QUINCKE BK (NEEDLE) ×1 IMPLANT
NEEDLE SPNL 22GX3.5 QUINCKE BK (NEEDLE) ×2 IMPLANT
NS IRRIG 1000ML POUR BTL (IV SOLUTION) ×2 IMPLANT
PACK VAGINAL MINOR WOMEN LF (CUSTOM PROCEDURE TRAY) ×2 IMPLANT
PAD OB MATERNITY 4.3X12.25 (PERSONAL CARE ITEMS) ×2 IMPLANT
PAD PREP 24X48 CUFFED NSTRL (MISCELLANEOUS) ×2 IMPLANT
SET BERKELEY SUCTION TUBING (SUCTIONS) ×2 IMPLANT
SYR CONTROL 10ML LL (SYRINGE) ×2 IMPLANT
TOWEL OR 17X24 6PK STRL BLUE (TOWEL DISPOSABLE) ×2 IMPLANT
VACURETTE 10 RIGID CVD (CANNULA) ×1 IMPLANT
VACURETTE 7MM CVD STRL WRAP (CANNULA) IMPLANT
VACURETTE 8 RIGID CVD (CANNULA) IMPLANT
VACURETTE 9 RIGID CVD (CANNULA) IMPLANT

## 2012-05-05 NOTE — Op Note (Signed)
Preoperative diagnosis: Missed AB  Postoperative diagnosis: Same  Anesthesia: IV sedation and paracervical block  Anesthesiologist: Dr. Brayton Caves  Procedure: Dilatation and evacuation  Surgeon: Dr. Dois Davenport Mataio Mele  Estimated blood loss: Minimal  Procedure:  After being informed of the planned procedure with possible complications including bleeding, infection and injury to uterus, informed consent is obtained and patient is taken to or #4. She is placed in the lithotomy position and given IV sedation without complication. She is then prepped and draped in a sterile  fashion and her bladder is emptied with an in and out red rubber catheter.  Pelvic exam reveals a retroverted  uterus compatible with [redacted] weeks gestation. Both adnexa are felt and normal.  A weighted speculum is inserted in the vagina and the anterior lip of the cervix was grasped with a tenaculum forcep. We proceed with a paracervical block using 20 cc of Nesacaine 1%. The uterus was then sounded at 11 cm. The cervix was easily dilated using Hegar dilator until  #33. Using a #10 curved cannula, we proceed with evacuation of products of conception without difficulty. We then proceed with sharp curettage of the uterine cavity to confirm complete evacuation.  Instruments are then removed. Instrument and sponge count is complete x2.   Estimated blood loss is minimal.  The procedure is very well tolerated by the patient is taken to recovery room in a well and stable condition.  Specimen: Products of conception sent to pathology

## 2012-05-05 NOTE — Preoperative (Signed)
Beta Blockers   Reason not to administer Beta Blockers:Not Applicable 

## 2012-05-05 NOTE — Anesthesia Preprocedure Evaluation (Addendum)
Anesthesia Evaluation  Patient identified by MRN, date of birth, ID band Patient awake    Reviewed: Allergy & Precautions, H&P , Patient's Chart, lab work & pertinent test results, reviewed documented beta blocker date and time   History of Anesthesia Complications Negative for: history of anesthetic complications  Airway Mallampati: II TM Distance: >3 FB Neck ROM: full    Dental No notable dental hx.    Pulmonary neg pulmonary ROS,  breath sounds clear to auscultation  Pulmonary exam normal       Cardiovascular Exercise Tolerance: Good negative cardio ROS  Rhythm:regular Rate:Normal     Neuro/Psych  Headaches, PSYCHIATRIC DISORDERS Anxiety Depression  Neuromuscular disease negative neurological ROS  negative psych ROS   GI/Hepatic negative GI ROS, Neg liver ROS, GERD-  Controlled,  Endo/Other  negative endocrine ROSMorbid obesity  Renal/GU negative Renal ROS     Musculoskeletal   Abdominal   Peds  Hematology negative hematology ROS (+)   Anesthesia Other Findings Arthritis     Depression        Allergy     Migraine        Multiple sclerosis     Anal fissure        Obesity     Ulcer 09/2009      Colon polyp 2005   Migraine        Multiple sclerosis     PCOS (polycystic ovarian syndrome)      Reproductive/Obstetrics negative OB ROS                           Anesthesia Physical Anesthesia Plan  ASA: III  Anesthesia Plan: MAC   Post-op Pain Management:    Induction:   Airway Management Planned:   Additional Equipment:   Intra-op Plan:   Post-operative Plan:   Informed Consent: I have reviewed the patients History and Physical, chart, labs and discussed the procedure including the risks, benefits and alternatives for the proposed anesthesia with the patient or authorized representative who has indicated his/her understanding and acceptance.   Dental Advisory Given  Plan  Discussed with: CRNA, Surgeon and Anesthesiologist  Anesthesia Plan Comments:        Anesthesia Quick Evaluation

## 2012-05-05 NOTE — H&P (View-Only) (Signed)
   Reason for admission:   Missed ab History:     Jessica Roberson is a 32 y.o. female, G1P0, presenting today to undergo  D &E for missed abortion at 10 + weeks of gestation. Seen and fully evaluated by Dr Dillard 04/24/12  Review of system:  Non-contributory   Past Medical History:   Past Medical History  Diagnosis Date  . Arthritis   . Depression   . Allergy   . Migraine   . Multiple sclerosis   . Anal fissure   . Obesity   . Ulcer 09/2009  . Colon polyp 2005  . Migraine   . Multiple sclerosis    Blood type: O +   Allergies:   Allergies  Allergen Reactions  . Aspirin   . Cyclobenzaprine Itching  . Hydromorphone Hcl   . Penicillins   . Tramadol Hcl     REACTION: hives    Social History:   History   Social History  . Marital Status: Married    Spouse Name: N/A    Number of Children: N/A  . Years of Education: N/A   Social History Main Topics  . Smoking status: Never Smoker   . Smokeless tobacco: Never Used  . Alcohol Use: 0.5 oz/week    1 drink(s) per week     Comment: once a week  . Drug Use: No  . Sexually Active: Yes    Birth Control/ Protection: None     Comment: preganat   Other Topics Concern  . 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 Stroke Mother   . Kidney disease Mother     ESRD  . Cancer Mother     ovarian, pancreatic,stomach,uterine,thyroid  . Multiple sclerosis Mother   . Depression Mother   . Hypertension Father   . Diabetes Father     type II  . Sarcoidosis Father   . Asthma Daughter   . Hypertension Maternal Grandmother   . Diabetes Maternal Grandmother   . Diabetes Paternal Grandfather   . Cancer Other     multiple aunts w/ breast cancer    Physical exam: per Dr Dillard 04/24/12    General Appearance: Alert, appropriate  appearance for age. No acute distress Neck / Thyroid: Supple, no masses, nodes or enlargement Lungs: clear to auscultation bilaterally Back: No CVA tenderness. Cardiovascular: Regular rate and rhythm. S1, S2, no murmur Gastrointestinal: Soft, non-tender, no masses or organomegaly Pelvic Exam: VV normal. Cervix spotting and closed. Uterus and adnexa unremarkable   Assessment:   Missed ab at 10 + weeks   Plan:    Proceed with D & E under IV sedation. Procedure, risks and benefits reviewed with patient including but not limited to bleeding, infection, injury to uterus and retained products of conception. Patient voices understanding and is agreeable to proceed.  

## 2012-05-05 NOTE — Interval H&P Note (Signed)
History and Physical Interval Note:  05/05/2012 9:54 AM  Jessica Roberson  has presented today for surgery, with the diagnosis of Missed ab  [id number]  The various methods of treatment have been discussed with the patient and family. After consideration of risks, benefits and other options for treatment, the patient has consented to  Procedure(s) (LRB) with comments: DILATATION AND EVACUATION (N/A) as a surgical intervention .  The patient's history has been reviewed, patient examined, no change in status, stable for surgery.  I have reviewed the patient's chart and labs.  Questions were answered to the patient's satisfaction.     [name] A

## 2012-05-05 NOTE — Transfer of Care (Signed)
Immediate Anesthesia Transfer of Care Note  Patient: Jessica Roberson  Procedure(s) Performed: Procedure(s) (LRB) with comments: DILATATION AND EVACUATION (N/A)  Patient Location: PACU  Anesthesia Type:MAC  Level of Consciousness: awake, alert  and oriented  Airway & Oxygen Therapy: Patient Spontanous Breathing and Patient connected to nasal cannula oxygen  Post-op Assessment: Report given to PACU RN, Post -op Vital signs reviewed and stable and Patient moving all extremities  Post vital signs: Reviewed  Complications: No apparent anesthesia complications

## 2012-05-06 ENCOUNTER — Encounter (HOSPITAL_COMMUNITY): Payer: Self-pay | Admitting: Obstetrics and Gynecology

## 2012-05-06 NOTE — Anesthesia Postprocedure Evaluation (Signed)
Anesthesia Post Note  Patient: Jessica Roberson  Procedure(s) Performed: Procedure(s) (LRB): DILATATION AND EVACUATION (N/A)  Anesthesia type: MAC  Patient location: PACU  Post pain: Pain level controlled  Post assessment: Post-op Vital signs reviewed  Last Vitals:  Filed Vitals:   05/05/12 1115  BP: 130/76  Pulse: 79  Temp: 36.5 C  Resp: 20    Post vital signs: Reviewed  Level of consciousness: sedated  Complications: No apparent anesthesia complications

## 2012-05-13 ENCOUNTER — Telehealth: Payer: Self-pay | Admitting: Obstetrics and Gynecology

## 2012-05-13 NOTE — Telephone Encounter (Signed)
Grief Card mailed for loss 05/05/12 -Adrianne Pridgen

## 2012-05-20 ENCOUNTER — Encounter: Payer: Self-pay | Admitting: Obstetrics and Gynecology

## 2012-11-04 ENCOUNTER — Inpatient Hospital Stay (HOSPITAL_COMMUNITY)
Admission: AD | Admit: 2012-11-04 | Discharge: 2012-11-04 | Disposition: A | Discharge status: Home / Self Care | Payer: BC Managed Care – PPO | Source: Ambulatory Visit | Attending: Obstetrics and Gynecology | Admitting: Obstetrics and Gynecology

## 2012-11-04 ENCOUNTER — Telehealth: Payer: Self-pay | Admitting: Obstetrics and Gynecology

## 2012-11-04 ENCOUNTER — Encounter (HOSPITAL_COMMUNITY): Payer: Self-pay

## 2012-11-04 DIAGNOSIS — R51 Headache: Secondary | ICD-10-CM | POA: Insufficient documentation

## 2012-11-04 DIAGNOSIS — H538 Other visual disturbances: Secondary | ICD-10-CM | POA: Insufficient documentation

## 2012-11-04 DIAGNOSIS — R42 Dizziness and giddiness: Secondary | ICD-10-CM | POA: Insufficient documentation

## 2012-11-04 DIAGNOSIS — O99891 Other specified diseases and conditions complicating pregnancy: Secondary | ICD-10-CM | POA: Insufficient documentation

## 2012-11-04 HISTORY — DX: Gestational (pregnancy-induced) hypertension without significant proteinuria, unspecified trimester: O13.9

## 2012-11-04 LAB — URINALYSIS, ROUTINE W REFLEX MICROSCOPIC
Bilirubin Urine: NEGATIVE
Leukocytes, UA: NEGATIVE
Nitrite: NEGATIVE
Specific Gravity, Urine: 1.015 (ref 1.005–1.030)
Urobilinogen, UA: 0.2 mg/dL (ref 0.0–1.0)
pH: 6 (ref 5.0–8.0)

## 2012-11-04 LAB — CBC
Hemoglobin: 12.2 g/dL (ref 12.0–15.0)
MCH: 27.4 pg (ref 26.0–34.0)
Platelets: 200 10*3/uL (ref 150–400)
RBC: 4.45 MIL/uL (ref 3.87–5.11)

## 2012-11-04 LAB — GLUCOSE, CAPILLARY: Glucose-Capillary: 86 mg/dL (ref 70–99)

## 2012-11-04 MED ORDER — MECLIZINE HCL 32 MG PO TABS: 32.0000 mg | ORAL_TABLET | Freq: Three times a day (TID) | ORAL | Status: DC | PRN

## 2012-11-04 MED ORDER — MECLIZINE HCL 25 MG PO TABS: 25.0000 mg | ORAL_TABLET | Freq: Three times a day (TID) | ORAL | Status: DC | PRN

## 2012-11-04 NOTE — Telephone Encounter (Signed)
Fax received from [facility] on [address] from Rx sent by [facility] for meclizine 32 mg TID prn from MAU. Advised Antivert is formulary available med, comes in 25 or 12.5 mg. Rx resent for Antivert 25 mg po TID prn, #30, no refills.

## 2012-11-04 NOTE — MAU Note (Addendum)
Last 2 days has been really dizzy.  Last night got bad, rm was spinning.  Today when driving, noticied vision was a little blurred.  Having headaches. Decrease appetite, some nausea.  Trying to increase fluids. +Fetal movement. (gait steady when walking)

## 2012-11-04 NOTE — MAU Provider Note (Signed)
History   32yo, [id number] at [redacted]w[redacted]d presents with dizziness and HA since yesterday and dizziness while driving today.  Pt reports that she is in "diet mode."  She eats 2 "good" meals a day.  Denies VB, UCs, LOF, recent fever, resp or GI c/o's, UTI or any other PIH s/s.   Chief Complaint  Patient presents with  . Dizziness  . Blurred Vision  . Headache    OB History   Grav Para Term Preterm Abortions TAB SAB Ect Mult Living   5 1 1  0 3 1 2  0 0 1      Past Medical History  Diagnosis Date  . Arthritis   . Depression   . Allergy   . Migraine   . Multiple sclerosis   . Anal fissure   . Obesity   . Ulcer 09/2009  . Colon polyp 2005  . Migraine   . Multiple sclerosis   . PCOS (polycystic ovarian syndrome)   . Pregnancy induced hypertension     Past Surgical History  Procedure Laterality Date  . Cesarean section    . Cholecystectomy  2007  . Knee arthroscopy      x 3  . Foot surgery      x 2  . Hernia repair  1984  . Hammer toe surgery  2011  . Foot tendon transfer  2004  . Dilation and evacuation  05/05/2012    Procedure: DILATATION AND EVACUATION;  Surgeon: Esmeralda Arthur, MD;  Location: [facility];  Service: Gynecology;  Laterality: N/A;  . Dilation and curettage of uterus      Family History  Problem Relation Age of Onset  . Stroke Mother   . Kidney disease Mother     ESRD  . Cancer Mother     ovarian, pancreatic,stomach,uterine,thyroid  . Multiple sclerosis Mother   . Depression Mother   . Hypertension Father   . Diabetes Father     type II  . Sarcoidosis Father   . Asthma Daughter   . Hypertension Maternal Grandmother   . Diabetes Maternal Grandmother   . Diabetes Paternal Grandfather   . Cancer Other     multiple aunts w/ breast cancer    History  Substance Use Topics  . Smoking status: Never Smoker   . Smokeless tobacco: Never Used  . Alcohol Use: 0.5 oz/week    1 drink(s) per week     Comment: once a week    Allergies:  Allergies  Allergen  Reactions  . Aspirin Anaphylaxis  . Penicillins Anaphylaxis  . Cyclobenzaprine Itching  . Hydromorphone Hcl Itching  . Tramadol Hcl     REACTION: hives    Prescriptions prior to admission  Medication Sig Dispense Refill  . acetaminophen (TYLENOL) 500 MG tablet Take 1,000 mg by mouth every 6 (six) hours as needed for pain.      . Prenatal Vit-Fe Fumarate-FA (PRENATAL MULTIVITAMIN) TABS Take 1 tablet by mouth daily.      . fexofenadine (ALLEGRA) 180 MG tablet Take 1 tablet (180 mg total) by mouth daily.  30 tablet  2    ROS: see HPI above, all other systems are negative   Physical Exam   Blood pressure 108/73, pulse 70, temperature 97.9 F (36.6 C), temperature source Oral, resp. rate 18, height 5\' 8"  (1.727 m), weight 272 lb 6.4 oz (123.56 kg), SpO2 99.00%.  Chest: Clear Heart: RRR Abdomen: gravid, NT Extremities: WNL  FHT: Doppler 160  ED Course  IUP at [redacted]w[redacted]d  Dizziness, HA, and blurred vision  CBC - Hgb Regular diet  Reported to Nigel Bridgeman to resume care.   Haroldine Laws CNM, MSN 11/04/2012 6:06 PM

## 2012-11-04 NOTE — MAU Note (Signed)
Patient states that she started feeling dizzy, having blurred vision and nausea since last night. Denies any problems with the pregnancy, no pain or bleeding.

## 2012-11-04 NOTE — MAU Provider Note (Signed)
Feeling better--only mild dizziness with significant position change. Denies nausea or vomiting.  No recent viral illness or URI. Does report mild allergy issues this season, with some congestion previously. Hx MS, followed by MD in [place]--reports MS sx in past have been related to muscle strength, with no issues during pregnancy.  Just saw that MD last week, with next visit scheduled in 4 months.  No hx of dizzy spells in past.  Ate small amount of crackers and juice after initial evaluation by CNM.  Results for orders placed during the hospital encounter of 11/04/12 (from the past 24 hour(s))  URINALYSIS, ROUTINE W REFLEX MICROSCOPIC     Status: None   Collection Time    11/04/12  3:00 PM      Result Value Range   Color, Urine YELLOW  YELLOW   APPearance CLEAR  CLEAR   Specific Gravity, Urine 1.015  1.005 - 1.030   pH 6.0  5.0 - 8.0   Glucose, UA NEGATIVE  NEGATIVE mg/dL   Hgb urine dipstick NEGATIVE  NEGATIVE   Bilirubin Urine NEGATIVE  NEGATIVE   Ketones, ur NEGATIVE  NEGATIVE mg/dL   Protein, ur NEGATIVE  NEGATIVE mg/dL   Urobilinogen, UA 0.2  0.0 - 1.0 mg/dL   Nitrite NEGATIVE  NEGATIVE   Leukocytes, UA NEGATIVE  NEGATIVE  CBC     Status: Abnormal   Collection Time    11/04/12  6:15 PM      Result Value Range   WBC 5.9  4.0 - 10.5 K/uL   RBC 4.45  3.87 - 5.11 MIL/uL   Hemoglobin 12.2  12.0 - 15.0 g/dL   HCT 81.1 (*) 91.4 - 78.2 %   MCV 78.2  78.0 - 100.0 fL   MCH 27.4  26.0 - 34.0 pg   MCHC 35.1  30.0 - 36.0 g/dL   RDW 95.6  21.3 - 08.6 %   Platelets 200  150 - 400 K/uL  GLUCOSE, CAPILLARY     Status: None   Collection Time    11/04/12  8:03 PM      Result Value Range   Glucose-Capillary 86  70 - 99 mg/dL    Ears--left drum appears full, right drum hidden due to significant cerumen.  Impression: IUP at 17 6/7 weeks Mild dizziness--? Related to fluid behind eardrum  Plan: Reviewed normal lab findings with patient and husband. Rx Antivert 25 mg po TID prn,  #30, 0 refills sent to patient's pharmacy May try Zyrtec daily, if desired. Push po fluids, increase protein snacks between meals, to avoid long periods without food. Keep scheduled appt on Friday for early glucola. Call prn.  Nigel Bridgeman, CNM 11/04/12 9p

## 2012-12-25 LAB — OB RESULTS CONSOLE RPR: RPR: NONREACTIVE

## 2012-12-25 LAB — OB RESULTS CONSOLE HIV ANTIBODY (ROUTINE TESTING): HIV: NONREACTIVE

## 2012-12-25 LAB — OB RESULTS CONSOLE RUBELLA ANTIBODY, IGM: Rubella: IMMUNE

## 2013-03-04 ENCOUNTER — Other Ambulatory Visit: Payer: Self-pay | Admitting: Obstetrics and Gynecology

## 2013-03-30 ENCOUNTER — Encounter (HOSPITAL_COMMUNITY): Payer: Self-pay | Admitting: Pharmacy Technician

## 2013-03-31 ENCOUNTER — Encounter (HOSPITAL_COMMUNITY)
Admission: RE | Admit: 2013-03-31 | Discharge: 2013-03-31 | Disposition: A | Discharge status: Home / Self Care | Payer: Medicaid Other | Source: Ambulatory Visit | Attending: Obstetrics and Gynecology | Admitting: Obstetrics and Gynecology

## 2013-03-31 ENCOUNTER — Encounter (HOSPITAL_COMMUNITY): Payer: Self-pay

## 2013-03-31 HISTORY — DX: Myoneural disorder, unspecified: G70.9

## 2013-03-31 LAB — CBC
HCT: 35.9 % — ABNORMAL LOW (ref 36.0–46.0)
Hemoglobin: 12.6 g/dL (ref 12.0–15.0)
MCHC: 35.1 g/dL (ref 30.0–36.0)
RDW: 15.3 % (ref 11.5–15.5)
WBC: 5.3 10*3/uL (ref 4.0–10.5)

## 2013-03-31 NOTE — Patient Instructions (Addendum)
   Your procedure is scheduled on: Thursday, Dec 11  Enter through the [employer] of [facility] at:  930 am Pick up the phone at the desk and dial [phone] and inform us of your arrival.  Please call this number if you have any problems the morning of surgery: [phone]  Remember: Do not eat or drink after midnight:  Wednesday Take these medicines the morning of surgery with a SIP OF WATER:  None  Do not wear jewelry, make-up, or FINGER nail polish No metal in your hair or on your body. Do not wear lotions, powders, perfumes. You may wear deodorant.  Please use your CHG wash as directed prior to surgery.  Do not shave anywhere for at least 12 hours prior to first CHG shower.  Do not bring valuables to the hospital. Contacts, dentures or bridgework may not be worn into surgery.  Leave suitcase in the car. After Surgery it may be brought to your room. For patients being admitted to the hospital, checkout time is 11:00am the day of discharge.  Home with husband "Lemart"  cell [phone].

## 2013-04-01 LAB — RPR: RPR Ser Ql: NONREACTIVE

## 2013-04-01 MED ORDER — GENTAMICIN SULFATE 40 MG/ML IJ SOLN
INTRAVENOUS | Status: AC
  Administered 2013-04-02: 100 mL via INTRAVENOUS
  Filled 2013-04-01: qty 11.5

## 2013-04-02 ENCOUNTER — Encounter (HOSPITAL_COMMUNITY)
Admission: AD | Disposition: A | Discharge status: Home / Self Care | Payer: Self-pay | Source: Ambulatory Visit | Attending: Obstetrics and Gynecology

## 2013-04-02 ENCOUNTER — Inpatient Hospital Stay (HOSPITAL_COMMUNITY): Payer: Medicaid Other

## 2013-04-02 ENCOUNTER — Inpatient Hospital Stay (HOSPITAL_COMMUNITY)
Admission: AD | Admit: 2013-04-02 | Discharge: 2013-04-04 | DRG: 766 | Disposition: A | Discharge status: Home / Self Care | Payer: Medicaid Other | Source: Ambulatory Visit | Attending: Obstetrics and Gynecology | Admitting: Obstetrics and Gynecology

## 2013-04-02 ENCOUNTER — Inpatient Hospital Stay (HOSPITAL_COMMUNITY): Payer: Medicaid Other | Admitting: Anesthesiology

## 2013-04-02 ENCOUNTER — Encounter (HOSPITAL_COMMUNITY): Payer: Self-pay | Admitting: Anesthesiology

## 2013-04-02 ENCOUNTER — Encounter (HOSPITAL_COMMUNITY): Payer: Medicaid Other | Admitting: Anesthesiology

## 2013-04-02 ENCOUNTER — Other Ambulatory Visit: Payer: Self-pay | Admitting: Obstetrics and Gynecology

## 2013-04-02 DIAGNOSIS — E669 Obesity, unspecified: Secondary | ICD-10-CM | POA: Diagnosis present

## 2013-04-02 DIAGNOSIS — D649 Anemia, unspecified: Secondary | ICD-10-CM | POA: Diagnosis not present

## 2013-04-02 DIAGNOSIS — Z98891 History of uterine scar from previous surgery: Secondary | ICD-10-CM

## 2013-04-02 DIAGNOSIS — Z8041 Family history of malignant neoplasm of ovary: Secondary | ICD-10-CM

## 2013-04-02 DIAGNOSIS — O9903 Anemia complicating the puerperium: Secondary | ICD-10-CM | POA: Diagnosis not present

## 2013-04-02 DIAGNOSIS — O34219 Maternal care for unspecified type scar from previous cesarean delivery: Principal | ICD-10-CM | POA: Diagnosis present

## 2013-04-02 DIAGNOSIS — Z3490 Encounter for supervision of normal pregnancy, unspecified, unspecified trimester: Secondary | ICD-10-CM

## 2013-04-02 DIAGNOSIS — Z4889 Encounter for other specified surgical aftercare: Secondary | ICD-10-CM

## 2013-04-02 DIAGNOSIS — Z302 Encounter for sterilization: Secondary | ICD-10-CM

## 2013-04-02 LAB — PREPARE RBC (CROSSMATCH)

## 2013-04-02 SURGERY — Surgical Case
Anesthesia: Epidural | Site: Abdomen

## 2013-04-02 MED ORDER — ONDANSETRON HCL 4 MG/2ML IJ SOLN
INTRAMUSCULAR | Status: DC | PRN
  Administered 2013-04-02: 4 mg via INTRAVENOUS

## 2013-04-02 MED ORDER — OXYCODONE-ACETAMINOPHEN 5-325 MG PO TABS
1.0000 | ORAL_TABLET | ORAL | Status: DC | PRN
  Administered 2013-04-03 – 2013-04-04 (×3): 1 via ORAL
  Filled 2013-04-02 (×3): qty 1

## 2013-04-02 MED ORDER — OXYTOCIN 10 UNIT/ML IJ SOLN
40.0000 [IU] | INTRAMUSCULAR | Status: DC | PRN
  Administered 2013-04-02: 40 [IU] via INTRAVENOUS

## 2013-04-02 MED ORDER — BUPIVACAINE HCL (PF) 0.25 % IJ SOLN
INTRAMUSCULAR | Status: AC
Start: 2013-04-02 — End: 2013-04-02
  Filled 2013-04-02: qty 10

## 2013-04-02 MED ORDER — FENTANYL CITRATE 0.05 MG/ML IJ SOLN
INTRAMUSCULAR | Status: AC
  Filled 2013-04-02: qty 2

## 2013-04-02 MED ORDER — SIMETHICONE 80 MG PO CHEW
80.0000 mg | CHEWABLE_TABLET | ORAL | Status: DC | PRN
  Administered 2013-04-03: 80 mg via ORAL
  Filled 2013-04-02: qty 1

## 2013-04-02 MED ORDER — NALOXONE HCL 0.4 MG/ML IJ SOLN: 0.4000 mg | INTRAMUSCULAR | Status: DC | PRN

## 2013-04-02 MED ORDER — PHENYLEPHRINE 8 MG IN D5W 100 ML (0.08MG/ML) PREMIX OPTIME
INJECTION | INTRAVENOUS | Status: AC
  Filled 2013-04-02: qty 100

## 2013-04-02 MED ORDER — MORPHINE SULFATE (PF) 0.5 MG/ML IJ SOLN
INTRAMUSCULAR | Status: DC | PRN
  Administered 2013-04-02: 3 mg via EPIDURAL

## 2013-04-02 MED ORDER — SODIUM CHLORIDE 0.9 % IJ SOLN: 3.0000 mL | INTRAMUSCULAR | Status: DC | PRN

## 2013-04-02 MED ORDER — WITCH HAZEL-GLYCERIN EX PADS: 1.0000 "application " | MEDICATED_PAD | CUTANEOUS | Status: DC | PRN

## 2013-04-02 MED ORDER — DIPHENHYDRAMINE HCL 50 MG/ML IJ SOLN
12.5000 mg | INTRAMUSCULAR | Status: DC | PRN
  Administered 2013-04-02: 12.5 mg via INTRAVENOUS
  Filled 2013-04-02: qty 1

## 2013-04-02 MED ORDER — PRENATAL MULTIVITAMIN CH
1.0000 | ORAL_TABLET | Freq: Every day | ORAL | Status: DC
  Administered 2013-04-03 – 2013-04-04 (×2): 1 via ORAL
  Filled 2013-04-02 (×2): qty 1

## 2013-04-02 MED ORDER — ZOLPIDEM TARTRATE 5 MG PO TABS: 5.0000 mg | ORAL_TABLET | Freq: Every evening | ORAL | Status: DC | PRN

## 2013-04-02 MED ORDER — SENNOSIDES-DOCUSATE SODIUM 8.6-50 MG PO TABS
2.0000 | ORAL_TABLET | ORAL | Status: DC
  Administered 2013-04-03 – 2013-04-04 (×2): 2 via ORAL
  Filled 2013-04-02: qty 2

## 2013-04-02 MED ORDER — OXYTOCIN 40 UNITS IN LACTATED RINGERS INFUSION - SIMPLE MED: 62.5000 mL/h | INTRAVENOUS | Status: AC

## 2013-04-02 MED ORDER — SCOPOLAMINE 1 MG/3DAYS TD PT72
1.0000 | MEDICATED_PATCH | Freq: Once | TRANSDERMAL | Status: DC
  Administered 2013-04-02: 1.5 mg via TRANSDERMAL

## 2013-04-02 MED ORDER — SIMETHICONE 80 MG PO CHEW
80.0000 mg | CHEWABLE_TABLET | ORAL | Status: DC
  Administered 2013-04-04: 80 mg via ORAL
  Filled 2013-04-02: qty 1

## 2013-04-02 MED ORDER — DIPHENHYDRAMINE HCL 50 MG/ML IJ SOLN: 25.0000 mg | INTRAMUSCULAR | Status: DC | PRN

## 2013-04-02 MED ORDER — MORPHINE SULFATE 0.5 MG/ML IJ SOLN
INTRAMUSCULAR | Status: AC
  Filled 2013-04-02: qty 10

## 2013-04-02 MED ORDER — LANOLIN HYDROUS EX OINT: 1.0000 "application " | TOPICAL_OINTMENT | CUTANEOUS | Status: DC | PRN

## 2013-04-02 MED ORDER — SCOPOLAMINE 1 MG/3DAYS TD PT72
MEDICATED_PATCH | TRANSDERMAL | Status: AC
  Administered 2013-04-02: 1.5 mg via TRANSDERMAL
  Filled 2013-04-02: qty 1

## 2013-04-02 MED ORDER — LACTATED RINGERS IV SOLN
INTRAVENOUS | Status: DC
  Administered 2013-04-02 (×3): via INTRAVENOUS

## 2013-04-02 MED ORDER — MEPERIDINE HCL 25 MG/ML IJ SOLN: 6.2500 mg | INTRAMUSCULAR | Status: DC | PRN

## 2013-04-02 MED ORDER — EPHEDRINE SULFATE 50 MG/ML IJ SOLN
INTRAMUSCULAR | Status: DC | PRN
  Administered 2013-04-02: 10 mg via INTRAVENOUS
  Administered 2013-04-02: 15 mg via INTRAVENOUS

## 2013-04-02 MED ORDER — PHENYLEPHRINE HCL 10 MG/ML IJ SOLN: INTRAMUSCULAR | Status: DC | PRN

## 2013-04-02 MED ORDER — ONDANSETRON HCL 4 MG/2ML IJ SOLN
INTRAMUSCULAR | Status: AC
  Filled 2013-04-02: qty 2

## 2013-04-02 MED ORDER — PHENYLEPHRINE HCL 10 MG/ML IJ SOLN
20.0000 mg | INTRAVENOUS | Status: DC | PRN
  Administered 2013-04-02: 60 ug via INTRAVENOUS

## 2013-04-02 MED ORDER — ONDANSETRON HCL 4 MG PO TABS: 4.0000 mg | ORAL_TABLET | ORAL | Status: DC | PRN

## 2013-04-02 MED ORDER — SCOPOLAMINE 1 MG/3DAYS TD PT72
1.0000 | MEDICATED_PATCH | Freq: Once | TRANSDERMAL | Status: DC
  Filled 2013-04-02: qty 1

## 2013-04-02 MED ORDER — DIPHENHYDRAMINE HCL 25 MG PO CAPS
25.0000 mg | ORAL_CAPSULE | Freq: Four times a day (QID) | ORAL | Status: DC | PRN
  Filled 2013-04-02: qty 1

## 2013-04-02 MED ORDER — IBUPROFEN 600 MG PO TABS: 600.0000 mg | ORAL_TABLET | Freq: Four times a day (QID) | ORAL | Status: DC

## 2013-04-02 MED ORDER — SODIUM BICARBONATE 8.4 % IV SOLN
INTRAVENOUS | Status: DC | PRN
  Administered 2013-04-02: 4 mL via EPIDURAL
  Administered 2013-04-02 (×3): 5 mL via EPIDURAL

## 2013-04-02 MED ORDER — DIBUCAINE 1 % RE OINT: 1.0000 "application " | TOPICAL_OINTMENT | RECTAL | Status: DC | PRN

## 2013-04-02 MED ORDER — FENTANYL CITRATE 0.05 MG/ML IJ SOLN: 25.0000 ug | INTRAMUSCULAR | Status: DC | PRN

## 2013-04-02 MED ORDER — LACTATED RINGERS IV SOLN
INTRAVENOUS | Status: DC
  Administered 2013-04-02 – 2013-04-03 (×2): via INTRAVENOUS

## 2013-04-02 MED ORDER — BUPIVACAINE HCL (PF) 0.25 % IJ SOLN
INTRAMUSCULAR | Status: DC | PRN
  Administered 2013-04-02: 10 mL

## 2013-04-02 MED ORDER — ONDANSETRON HCL 4 MG/2ML IJ SOLN: 4.0000 mg | INTRAMUSCULAR | Status: DC | PRN

## 2013-04-02 MED ORDER — EPHEDRINE 5 MG/ML INJ
INTRAVENOUS | Status: AC
  Filled 2013-04-02: qty 10

## 2013-04-02 MED ORDER — NALOXONE HCL 1 MG/ML IJ SOLN
1.0000 ug/kg/h | INTRAVENOUS | Status: DC | PRN
  Filled 2013-04-02: qty 2

## 2013-04-02 MED ORDER — ONDANSETRON HCL 4 MG/2ML IJ SOLN: 4.0000 mg | Freq: Three times a day (TID) | INTRAMUSCULAR | Status: DC | PRN

## 2013-04-02 MED ORDER — METOCLOPRAMIDE HCL 5 MG/ML IJ SOLN: 10.0000 mg | Freq: Three times a day (TID) | INTRAMUSCULAR | Status: DC | PRN

## 2013-04-02 MED ORDER — DIPHENHYDRAMINE HCL 25 MG PO CAPS
25.0000 mg | ORAL_CAPSULE | ORAL | Status: DC | PRN
  Administered 2013-04-03: 25 mg via ORAL
  Filled 2013-04-02 (×2): qty 1

## 2013-04-02 MED ORDER — OXYTOCIN 10 UNIT/ML IJ SOLN
INTRAMUSCULAR | Status: AC
  Filled 2013-04-02: qty 4

## 2013-04-02 MED ORDER — PHENYLEPHRINE 40 MCG/ML (10ML) SYRINGE FOR IV PUSH (FOR BLOOD PRESSURE SUPPORT)
PREFILLED_SYRINGE | INTRAVENOUS | Status: AC
  Filled 2013-04-02: qty 5

## 2013-04-02 MED ORDER — ATROPINE SULFATE 0.4 MG/ML IJ SOLN
INTRAMUSCULAR | Status: DC | PRN
  Administered 2013-04-02 (×2): 0.2 mg via INTRAVENOUS

## 2013-04-02 MED ORDER — SIMETHICONE 80 MG PO CHEW
80.0000 mg | CHEWABLE_TABLET | Freq: Three times a day (TID) | ORAL | Status: DC
  Administered 2013-04-03 – 2013-04-04 (×5): 80 mg via ORAL
  Filled 2013-04-02 (×4): qty 1

## 2013-04-02 MED ORDER — MENTHOL 3 MG MT LOZG: 1.0000 | LOZENGE | OROMUCOSAL | Status: DC | PRN

## 2013-04-02 MED ORDER — CEFAZOLIN SODIUM 1-5 GM-% IV SOLN: INTRAVENOUS | Status: DC | PRN

## 2013-04-02 MED ORDER — FENTANYL CITRATE 0.05 MG/ML IJ SOLN
100.0000 ug | Freq: Once | INTRAMUSCULAR | Status: AC
  Administered 2013-04-02: 100 ug via INTRAVENOUS

## 2013-04-02 MED ORDER — LACTATED RINGERS IV SOLN
Freq: Once | INTRAVENOUS | Status: AC
  Administered 2013-04-02: 10:00:00 via INTRAVENOUS

## 2013-04-02 SURGICAL SUPPLY — 47 items
ADH SKN CLS APL DERMABOND .7 (GAUZE/BANDAGES/DRESSINGS)
APL SKNCLS STERI-STRIP NONHPOA (GAUZE/BANDAGES/DRESSINGS)
BENZOIN TINCTURE PRP APPL 2/3 (GAUZE/BANDAGES/DRESSINGS) IMPLANT
BLADE EXTENDED COATED 6.5IN (ELECTRODE) IMPLANT
BOOTIES KNEE HIGH SLOAN (MISCELLANEOUS) ×4 IMPLANT
CLAMP CORD UMBIL (MISCELLANEOUS) IMPLANT
CLOTH BEACON ORANGE TIMEOUT ST (SAFETY) ×2 IMPLANT
CONTAINER PREFILL 10% NBF 15ML (MISCELLANEOUS) ×4 IMPLANT
DERMABOND ADVANCED (GAUZE/BANDAGES/DRESSINGS)
DERMABOND ADVANCED .7 DNX12 (GAUZE/BANDAGES/DRESSINGS) IMPLANT
DRAIN JACKSON PRT FLT 7MM (DRAIN) IMPLANT
DRAPE LG THREE QUARTER DISP (DRAPES) IMPLANT
DRSG OPSITE POSTOP 4X10 (GAUZE/BANDAGES/DRESSINGS) ×2 IMPLANT
DURAPREP 26ML APPLICATOR (WOUND CARE) ×2 IMPLANT
ELECT CAUTERY BLADE 6.4 (BLADE) IMPLANT
ELECT REM PT RETURN 9FT ADLT (ELECTROSURGICAL) ×2
ELECTRODE REM PT RTRN 9FT ADLT (ELECTROSURGICAL) ×1 IMPLANT
EVACUATOR SILICONE 100CC (DRAIN) IMPLANT
EXTRACTOR VACUUM KIWI (MISCELLANEOUS) IMPLANT
EXTRACTOR VACUUM M CUP 4 TUBE (SUCTIONS) IMPLANT
GLOVE SURG SS PI 6.5 STRL IVOR (GLOVE) ×4 IMPLANT
GOWN PREVENTION PLUS XLARGE (GOWN DISPOSABLE) ×2 IMPLANT
GOWN STRL REIN XL XLG (GOWN DISPOSABLE) ×2 IMPLANT
KIT ABG SYR 3ML LUER SLIP (SYRINGE) IMPLANT
NDL HYPO 25X5/8 SAFETYGLIDE (NEEDLE) ×1 IMPLANT
NDL SPNL 22GX3.5 QUINCKE BK (NEEDLE) ×1 IMPLANT
NEEDLE HYPO 25X5/8 SAFETYGLIDE (NEEDLE) ×2 IMPLANT
NEEDLE SPNL 22GX3.5 QUINCKE BK (NEEDLE) ×2 IMPLANT
NS IRRIG 1000ML POUR BTL (IV SOLUTION) ×2 IMPLANT
PACK C SECTION WH (CUSTOM PROCEDURE TRAY) ×2 IMPLANT
PAD OB MATERNITY 4.3X12.25 (PERSONAL CARE ITEMS) ×2 IMPLANT
STRIP CLOSURE SKIN 1/4X4 (GAUZE/BANDAGES/DRESSINGS) IMPLANT
SUT CHROMIC 2 0 SH (SUTURE) ×3 IMPLANT
SUT MNCRL AB 3-0 PS2 27 (SUTURE) ×2 IMPLANT
SUT SILK 0 FSL (SUTURE) IMPLANT
SUT VIC AB 0 CT1 27 (SUTURE) ×6
SUT VIC AB 0 CT1 27XBRD ANBCTR (SUTURE) ×2 IMPLANT
SUT VIC AB 0 CT1 36 (SUTURE) ×2 IMPLANT
SUT VIC AB 0 CTXB 36 (SUTURE) IMPLANT
SUT VIC AB 2-0 CT1 27 (SUTURE) ×4
SUT VIC AB 2-0 CT1 TAPERPNT 27 (SUTURE) ×2 IMPLANT
SUT VIC AB 2-0 SH 27 (SUTURE) ×2
SUT VIC AB 2-0 SH 27XBRD (SUTURE) IMPLANT
SYR CONTROL 10ML LL (SYRINGE) ×2 IMPLANT
TOWEL OR 17X24 6PK STRL BLUE (TOWEL DISPOSABLE) ×2 IMPLANT
TRAY FOLEY CATH 14FR (SET/KITS/TRAYS/PACK) ×2 IMPLANT
WATER STERILE IRR 1000ML POUR (IV SOLUTION) ×1 IMPLANT

## 2013-04-02 NOTE — Transfer of Care (Signed)
Immediate Anesthesia Transfer of Care Note  Patient: Jessica Roberson  Procedure(s) Performed: Procedure(s): REPEAT CESAREAN SECTION (N/A)  Patient Location: PACU  Anesthesia Type:Epidural  Level of Consciousness: awake, alert  and oriented  Airway & Oxygen Therapy: Patient Spontanous Breathing  Post-op Assessment: Report given to PACU RN and Post -op Vital signs reviewed and stable  Post vital signs: Reviewed and stable  Complications: No apparent anesthesia complications

## 2013-04-02 NOTE — Anesthesia Procedure Notes (Signed)
Epidural Patient location during procedure: OB Start time: 04/02/2013 11:02 AM  Staffing Anesthesiologist: Chiante Peden A. Performed by: anesthesiologist   Preanesthetic Checklist Completed: patient identified, site marked, surgical consent, pre-op evaluation, timeout performed, IV checked, risks and benefits discussed and monitors and equipment checked  Epidural Patient position: sitting Prep: site prepped and draped and DuraPrep Patient monitoring: continuous pulse ox and blood pressure Approach: midline Injection technique: LOR air  Needle:  Needle type: Tuohy  Needle gauge: 17 G Needle length: 9 cm and 9 Needle insertion depth: 9 cm Catheter type: closed end flexible Catheter size: 19 Gauge Catheter at skin depth: 14 cm Test dose: negative and Other  Assessment Events: blood not aspirated, injection not painful, no injection resistance, negative IV test and no paresthesia  Additional Notes Patient tolerated procedure well. Adequate sensory level.Difficult due to MO and poor positioning. Attempt x 2.

## 2013-04-02 NOTE — H&P (Signed)
History and Physical Interval Note:   04/02/2013   10:58 AM   Jessica Roberson  has presented today for surgery, with the diagnosis of Prior Cesarean Section and desire for surgical sterilization. The various methods of treatment have been discussed with the patient and family. After consideration of risks, benefits and other options for treatment, the patient has consented to  Procedure(s): REPEAT CESAREAN SECTION and tubal steriliziation as a surgical intervention .  I have reviewed the patients' chart and labs.  Questions were answered to the patient's satisfaction.     Hal Morales  MD

## 2013-04-02 NOTE — Lactation Note (Signed)
This note was copied from the chart of Jessica Roberson. Lactation Consultation NoteReturned to room per [facility] RN's request to assist with latch.  Baby sleepy awake.  Minimal assistance needed to latch baby to left breast in football hold.  Baby has wide flanged lips with rhythmic suckling burst.  Encouraged mom to try and keep baby stimulated during feedings.  Observed 5 minutes of feeding and left in [facility] RN's care at bedside.   Patient Name: Jessica Taimi Towe [id number] Date: 04/02/2013 Reason for consult: Follow-up assessment   Maternal Data Formula Feeding for Exclusion: No Does the patient have breastfeeding experience prior to this delivery?: Yes  Feeding Feeding Type: Breast Fed Length of feed:  (observed 5 minutes)  LATCH Score/Interventions Latch: Grasps breast easily, tongue down, lips flanged, rhythmical sucking. Intervention(s): Breast massage;Breast compression;Assist with latch;Adjust position  Audible Swallowing: None Intervention(s): Skin to skin;Hand expression  Type of Nipple: Everted at rest and after stimulation  Comfort (Breast/Nipple): Soft / non-tender     Hold (Positioning): Assistance needed to correctly position infant at breast and maintain latch. Intervention(s): Skin to skin;Position options;Support Pillows;Breastfeeding basics reviewed  LATCH Score: 7  Lactation Tools Discussed/Used     Consult Status Consult Status: Follow-up Date: 04/03/13 Follow-up type: In-patient    Jannifer Rodney 04/02/2013, 9:23 PM

## 2013-04-02 NOTE — Op Note (Signed)
Cesarean Section and bilateral salpingectomy Procedure Note  Indications: previous uterine incision x1  Pre-operative Diagnosis: 39 week 1 day pregnancy. Desire for surgical sterilization.  Family history of ovarian cancer  Post-operative Diagnosis: same  Surgeon: Hal Morales  First Assistant:  Surgeon: Hal Morales   Assistants: Henreitta Leber certified physician Asst.  Anesthesia: Spinal anesthesia  ASA Class: 3  Procedure Details  The patient was seen in the Holding Room. The risks, benefits, complications, treatment options, and expected outcomes were discussed with the patient.  The patient concurred with the proposed plan, giving informed consent.  The site of surgery properly noted/marked. The patient was taken to Operating Room # 9, identified as Jessica Roberson and the procedure verified as C-Section Delivery. A Time Out was held and the above information confirmed.  After induction of anesthesia, the patient was  prepped with chlor prep in the usual sterile manner.  The redundant panniculus was gently taped to the shoulder A foley catheter was placed under sterile conditions.  The patient was then draped in the usual fashion.   Suprapubicsubcutaneous injection of 0.25% Bupivacaine   A Pfannenstiel incision was made and carried down through the subcutaneous tissue to the fascia. Fascial incision was made and extended transversely. The fascia was separated from the underlying rectus tissue superiorly and inferiorly. The peritoneum was identified and entered. Peritoneal incision was extended longitudinally. An Alexis retractor was placed.   A low transverse uterine incision was made two cm above the uterovesical fold, and that incision extended transversely bluntly.The infant was  delivered from occiput transverse presentation was a living female infant with Apgar scores of 9 at one minute and 9 at five minutes. The weight was pending at the time of dictation After the  umbilical cord was clamped and cut.   The placenta was removed intact and appeared normal.  It was handed off to the representatives from [facility] cord blood Bank for cord blood donation and to obtain the appropriate cord blood for evaluation. The uterine outline, tubes and ovaries appeared norma for the gravid state. The uterine incision was closed with running locked sutures of 0 Vicryl. An imbricating layer of sutures was placed. Hemostasis was observed. Lavage was carried out until clear. The left fallopian tube was then identified, and followed to its fimbriated end. The mesial salpinx was successively suture ligated and incised with cautery to allow removal of the entire fallopian tube. All the way up to its cornual insertion. The sutures were 2-0 chromic. The left fallopian tube was removed from the operative field in its entirety. A similar procedure was carried out on the opposite side and the right fallopian tube removed from the operative field in its entirety.  The peritoneum was closed with a running suture of 2-0 Vicryl.  The rectus muscles were reapproximated with a figure of 8 suture of 2-0 Vicryl.  The fascia was then reapproximated with a running sutures of 0 Vicryl .Renforcing figure of 8 sutures of 0Vicryl were placed on either side of midline. The subcutaneous tissue was reapproximated with interrupted sutures of 2-0 Vicryl  The skin was reapproximated with 3-0 moncryl.  A sterile dressing was applied.  Instrument, sponge, and needle counts were correct prior to the abdominal closure and at the conclusion of the case.   Findings:  Placenta contained a 3 vessel cord    Estimated Blood Loss:  750 cc         Drains: None  Specimens: Placenta to birthing suite   Right and left fallopian tubes were sent to pathology         Implants: None         Complications: ::"None; patient tolerated the procedure well."         Disposition: PACU - hemodynamically stable.          Condition: stable  Attending Attestation: I performed the procedure.  Michole Lecuyer P  04/02/2013 12:59 PM

## 2013-04-02 NOTE — Lactation Note (Signed)
This note was copied from the chart of Jessica Roberson. Lactation Consultation Note Initial visit at 8 hours of age.  Mom reports some good feeding, but problems with tongue thrusting and lips not flanged.  Encouraged suck training with gloved finger and jaw massage to relax the jaw.  Attempted to wake baby, but not successful.  Discussed [facility] LC resources and brochure given.  Mom knows how to do hand expression and has seen a few drops of colostrum.  Encouraged to feed with cues and skin to skin.  Mom to call for assist as needed.   Patient Name: Jessica Roberson [id number] Date: 04/02/2013 Reason for consult: Initial assessment   Maternal Data Formula Feeding for Exclusion: No Does the patient have breastfeeding experience prior to this delivery?: Yes  Feeding    LATCH Score/Interventions                      Lactation Tools Discussed/Used     Consult Status Consult Status: Follow-up Date: 04/03/13 Follow-up type: In-patient    Shoptaw, Arvella Merles 04/02/2013, 8:14 PM

## 2013-04-02 NOTE — Anesthesia Preprocedure Evaluation (Addendum)
Anesthesia Evaluation  Patient identified by MRN, date of birth, ID band Patient awake    Reviewed: Allergy & Precautions, H&P , NPO status , Patient's Chart, lab work & pertinent test results  Airway Mallampati: III TM Distance: >3 FB Neck ROM: Full    Dental no notable dental hx. (+) Teeth Intact   Pulmonary neg pulmonary ROS, sleep apnea ,  breath sounds clear to auscultation  Pulmonary exam normal       Cardiovascular hypertension, Rhythm:Regular Rate:Normal     Neuro/Psych  Headaches, PSYCHIATRIC DISORDERS Anxiety Depression MS under control  Neuromuscular disease    GI/Hepatic Neg liver ROS, GERD-  Medicated and Controlled,  Endo/Other  PCOS   Renal/GU negative Renal ROS  negative genitourinary   Musculoskeletal negative musculoskeletal ROS (+)   Abdominal (+) + obese,   Peds  Hematology   Anesthesia Other Findings   Reproductive/Obstetrics (+) Pregnancy Previous C/Section HSV                          Anesthesia Physical Anesthesia Plan  ASA: III  Anesthesia Plan: Epidural   Post-op Pain Management:    Induction:   Airway Management Planned: Natural Airway  Additional Equipment:   Intra-op Plan:   Post-operative Plan:   Informed Consent: I have reviewed the patients History and Physical, chart, labs and discussed the procedure including the risks, benefits and alternatives for the proposed anesthesia with the patient or authorized representative who has indicated his/her understanding and acceptance.   Dental advisory given  Plan Discussed with: CRNA, Anesthesiologist and Surgeon  Anesthesia Plan Comments:         Anesthesia Quick Evaluation

## 2013-04-02 NOTE — H&P (Signed)
Jessica Roberson is a 32 y.o. female presenting for scheduled repeat cesarean section and BTL.   Pregnancy course: Pt began [facility] at 6wks  [place] for dating = [facility] 04/08/13  Pt had vaginal bleeding, [facility] noted on [place], resolved +urine culture, tx'd with zithromax  Normal quad screen Normal anatomy scan Early 1hr gtt WNL, 3rd trimester 1hr gtt WNL rcv'd TDAP vaccine  rcv'd Flu vaccine      Maternal Medical History:  Reason for admission: Scheduled repeat c/s   Fetal activity: Perceived fetal activity is normal.   Last perceived fetal movement was within the past hour.    Prenatal complications: no prenatal complications Prenatal Complications - Diabetes: none.    OB History   Grav Para Term Preterm Abortions TAB SAB Ect Mult Living   5 1 1  0 3 1 2  0 0 1      G1 - 2002 TAB G2 - 2008 full term, scheduled primary, increased BP G3 - 10/13, SAB G4 - 1/14, SAB  G5 - current   Past Medical History  Diagnosis Date  . Allergy   . Multiple sclerosis   . Anal fissure   . Obesity   . Ulcer 09/2009  . Colon polyp 2005  . Multiple sclerosis   . PCOS (polycystic ovarian syndrome)   . Pregnancy induced hypertension 03/2007    Resolved - PIH with 2008 pregnancy   . Depression     resolved - situational  . Migraine     history - otc med prn  . Neuromuscular disorder     Multiple sclerosis - no meds with 2014 pregnancy  . Arthritis     right knee   Past Surgical History  Procedure Laterality Date  . Cesarean section  2008    wh  . Cholecystectomy  2007  . Knee arthroscopy      x 3 - right knee  . Foot surgery  [id number]    x 2 right foot  . Hernia repair  1984  . Hammer toe surgery  2011  . Foot tendon transfer  2004  . Dilation and evacuation  05/05/2012    Procedure: DILATATION AND EVACUATION;  Surgeon: Esmeralda Arthur, MD;  Location: [facility];  Service: Gynecology;  Laterality: N/A;  . Dilation and curettage of uterus     Family History: family history includes  Asthma in her daughter; Cancer in her mother and other; Depression in her mother; Diabetes in her father, maternal grandmother, and paternal grandfather; Hypertension in her father and maternal grandmother; Kidney disease in her mother; Multiple sclerosis in her mother; Sarcoidosis in her father; Stroke in her mother. Social History:  reports that she has never smoked. She has never used smokeless tobacco. She reports that she drinks alcohol. She reports that she does not use illicit drugs.   Prenatal Transfer Tool  Maternal Diabetes: No Genetic Screening: Normal Maternal Ultrasounds/Referrals: Normal Fetal Ultrasounds or other Referrals:  None Maternal Substance Abuse:  No Significant Maternal Medications:  None Significant Maternal Lab Results:  Lab values include: Group B Strep negative Other Comments:  None  Review of Systems  All other systems reviewed and are negative.      There were no vitals taken for this visit. Exam Physical Exam  Prenatal labs: ABO, Rh: --/--/O POS (12/09 1345) Antibody: NEG (12/09 1345) Rubella:   Immune 9/4  RPR: NON REACTIVE (12/09 1345)  HBsAg:   neg 9/4 HIV:   9/4  GBS:   neg  Assessment/Plan: IUP at [redacted]w[redacted]d Hx c/s, desires repeat and BTL (BTL consent in chart)  GBS neg   Admit to pre-op per Dr Pennie Rushing Routine pre-op orders Will discuss w Dr Pennie Rushing regarding abx for surgery due to allergy to PCN     Octavio Matheney M 04/02/2013, 7:18 AM

## 2013-04-02 NOTE — Anesthesia Postprocedure Evaluation (Signed)
Anesthesia Post Note  Patient: Jessica Roberson  Procedure(s) Performed: Procedure(s) (LRB): REPEAT CESAREAN SECTION (N/A)  Anesthesia type: Spinal  Patient location: PACU  Post pain: Pain level controlled  Post assessment: Post-op Vital signs reviewed  Last Vitals:  Filed Vitals:   04/02/13 1330  BP: 122/92  Pulse: 88  Temp:   Resp: 22    Post vital signs: Reviewed  Level of consciousness: awake  Complications: No apparent anesthesia complications

## 2013-04-03 ENCOUNTER — Encounter (HOSPITAL_COMMUNITY): Payer: Self-pay | Admitting: Obstetrics and Gynecology

## 2013-04-03 LAB — CBC
Hemoglobin: 10.2 g/dL — ABNORMAL LOW (ref 12.0–15.0)
MCH: 25.8 pg — ABNORMAL LOW (ref 26.0–34.0)
MCV: 75.7 fL — ABNORMAL LOW (ref 78.0–100.0)
RBC: 3.95 MIL/uL (ref 3.87–5.11)
RDW: 15.4 % (ref 11.5–15.5)
WBC: 6.9 10*3/uL (ref 4.0–10.5)

## 2013-04-03 LAB — CCBB MATERNAL DONOR DRAW

## 2013-04-03 MED ORDER — POLYSACCHARIDE IRON COMPLEX 150 MG PO CAPS
150.0000 mg | ORAL_CAPSULE | Freq: Every day | ORAL | Status: DC
  Administered 2013-04-04: 150 mg via ORAL
  Filled 2013-04-03 (×3): qty 1

## 2013-04-03 NOTE — Discharge Summary (Signed)
Cesarean Section Delivery Discharge Summary  Jessica Roberson  DOB:    14-Oct-1980 MRN:    [id number] CSN:    [id number]  Date of admission:                  04/02/13  Date of discharge:                   04/04/13  Procedures this admission: Repeat c/s  Date of Delivery: 04/02/13 by Dr Pennie Rushing  Newborn Data:  Live born female  Birth Weight: 7 lb 5.3 oz (3325 g) APGAR: 9, 9  Home with mother. Circumcision Plan: Declined  History of Present Illness:  Ms. GABRIEL PAULDING is a 32 y.o. female, [phone], who presents at [redacted]w[redacted]d weeks gestation. The patient has been followed at the [facility] and Gynecology division of [facility].    Her pregnancy has been complicated by:  Patient Active Problem List   Diagnosis Date Noted  . Morbid obesity 04/02/2013  . Previous cesarean section 04/02/2013  . Sterilization 04/02/2013  . Pregnancy 04/02/2013  . Encounter for postoperative care 04/02/2013  . POLYCYSTIC OVARIAN DISEASE 06/05/2010  . SINUSITIS 06/02/2010  . HIRSUTISM 05/12/2010  . ANXIETY 03/31/2010  . HYPERSOMNIA, ASSOCIATED WITH SLEEP APNEA 12/20/2009  . HYPERGLYCEMIA 12/09/2009  . GENITAL HERPES 11/23/2009  . HERPES SIMPLEX INFECTION, TYPE I 11/23/2009  . VITAMIN D DEFICIENCY 11/23/2009  . GERD 11/08/2009  . DEPRESSION, MILD 10/31/2009  . MULTIPLE SCLEROSIS 10/31/2009  . MIGRAINES, HX OF 10/31/2009  . MORBID OBESITY 09/27/2009  . PERSONAL HX COLONIC POLYPS 09/27/2009     Hospital course:  The patient was admitted for repeat c/s.   Her postpartum course was not complicated.  She was discharged to home on postpartum day 2 doing well.  Feeding:  breast  Contraception:  bilateral tubal ligation  Discharge hemoglobin:  Hemoglobin  Date Value Range Status  04/03/2013 10.2* 12.0 - 15.0 g/dL Final     HCT  Date Value Range Status  04/03/2013 29.9* 36.0 - 46.0 % Final    Discharge Physical Exam:   General: alert,  cooperative and no distress Lochia: appropriate Uterine Fundus: firm Incision: healing well DVT Evaluation: No evidence of DVT seen on physical exam. Negative Homan's sign.  Intrapartum Procedures: cesarean: low cervical, transverse and tubal ligation Postpartum Procedures: none Complications-Operative and Postpartum: none  Discharge Diagnoses: Term Pregnancy-delivered  Discharge Information:  Activity:           pelvic rest Diet:                routine Medications: PNV, Ibuprofen, Iron and Percocet Condition:      stable Instructions:  Care After Cesarean Delivery  Refer to this sheet in the next few weeks. These instructions provide you with information on caring for yourself after your procedure. Your caregiver may also give you specific instructions. Your treatment has been planned according to current medical practices, but problems sometimes occur. Call your caregiver if you have any problems or questions after you go home. HOME CARE INSTRUCTIONS  Only take over-the-counter or prescription medicines as directed by your caregiver.  Do not drink alcohol, especially if you are breastfeeding or taking medicine to relieve pain.  Do not chew or smoke tobacco.  Continue to use good perineal care. Good perineal care includes:  Wiping your perineum from front to back.  Keeping your perineum clean.  Check your cut (incision) daily for increased redness, drainage, swelling, or  separation of skin.  Clean your incision gently with soap and water every day, and then pat it dry. If your caregiver says it is okay, leave the incision uncovered. Use a bandage (dressing) if the incision is draining fluid or appears irritated. If the adhesive strips across the incision do not fall off within 7 days, carefully peel them off.  Hug a pillow when coughing or sneezing until your incision is healed. This helps to relieve pain.  Do not use tampons or douche until your caregiver says it is  okay.  Shower, wash your hair, and take tub baths as directed by your caregiver.  Wear a well-fitting bra that provides breast support.  Limit wearing support panties or control-top hose.  Drink enough fluids to keep your urine clear or pale yellow.  Eat high-fiber foods such as whole grain cereals and breads, brown rice, beans, and fresh fruits and vegetables every day. These foods may help prevent or relieve constipation.  Resume activities such as climbing stairs, driving, lifting, exercising, or traveling as directed by your caregiver.  Talk to your caregiver about resuming sexual activities. This is dependent upon your risk of infection, your rate of healing, and your comfort and desire to resume sexual activity.  Try to have someone help you with your household activities and your newborn for at least a few days after you leave the hospital.  Rest as much as possible. Try to rest or take a nap when your newborn is sleeping.  Increase your activities gradually.  Keep all of your scheduled postpartum appointments. It is very important to keep your scheduled follow-up appointments. At these appointments, your caregiver will be checking to make sure that you are healing physically and emotionally. SEEK MEDICAL CARE IF:   You are passing large clots from your vagina. Save any clots to show your caregiver.  You have a foul smelling discharge from your vagina.  You have trouble urinating.  You are urinating frequently.  You have pain when you urinate.  You have a change in your bowel movements.  You have increasing redness, pain, or swelling near your incision.  You have pus draining from your incision.  Your incision is separating.  You have painful, hard, or reddened breasts.  You have a severe headache.  You have blurred vision or see spots.  You feel sad or depressed.  You have thoughts of hurting yourself or your newborn.  You have questions about your care,  the care of your newborn, or medicines.  You are dizzy or lightheaded.  You have a rash.  You have pain, redness, or swelling at the site of the removed intravenous access (IV) tube.  You have nausea or vomiting.  You stopped breastfeeding and have not had a menstrual period within 12 weeks of stopping.  You are not breastfeeding and have not had a menstrual period within 12 weeks of delivery.  You have a fever. SEEK IMMEDIATE MEDICAL CARE IF:  You have persistent pain.  You have chest pain.  You have shortness of breath.  You faint.  You have leg pain.  You have stomach pain.  Your vaginal bleeding saturates 2 or more sanitary pads in 1 hour. MAKE SURE YOU:   Understand these instructions.  Will watch your condition.  Will get help right away if you are not doing well or get worse. Document Released: 12/30/2001 Document Revised: 01/02/2012 Document Reviewed: 12/05/2011 [facility] Patient Information 2014 [place], [place].   Postpartum Depression and Baby Blues  The postpartum period begins right after the birth of a baby. During this time, there is often a great amount of joy and excitement. It is also a time of considerable changes in the life of the parent(s). Regardless of how many times a mother gives birth, each child brings new challenges and dynamics to the family. It is not unusual to have feelings of excitement accompanied by confusing shifts in moods, emotions, and thoughts. All mothers are at risk of developing postpartum depression or the "baby blues." These mood changes can occur right after giving birth, or they may occur many months after giving birth. The baby blues or postpartum depression can be mild or severe. Additionally, postpartum depression can resolve rather quickly, or it can be a long-term condition. CAUSES Elevated hormones and their rapid decline are thought to be a main cause of postpartum depression and the baby blues. There are a number of  hormones that radically change during and after pregnancy. Estrogen and progesterone usually decrease immediately after delivering your baby. The level of thyroid hormone and various cortisol steroids also rapidly drop. Other factors that play a major role in these changes include major life events and genetics.  RISK FACTORS If you have any of the following risks for the baby blues or postpartum depression, know what symptoms to watch out for during the postpartum period. Risk factors that may increase the likelihood of getting the baby blues or postpartum depression include:  Havinga personal or family history of depression.  Having depression while being pregnant.  Having premenstrual or oral contraceptive-associated mood issues.  Having exceptional life stress.  Having marital conflict.  Lacking a social support network.  Having a baby with special needs.  Having health problems such as diabetes. SYMPTOMS Baby blues symptoms include:  Brief fluctuations in mood, such as going from extreme happiness to sadness.  Decreased concentration.  Difficulty sleeping.  Crying spells, tearfulness.  Irritability.  Anxiety. Postpartum depression symptoms typically begin within the first month after giving birth. These symptoms include:  Difficulty sleeping or excessive sleepiness.  Marked weight loss.  Agitation.  Feelings of worthlessness.  Lack of interest in activity or food. Postpartum psychosis is a very concerning condition and can be dangerous. Fortunately, it is rare. Displaying any of the following symptoms is cause for immediate medical attention. Postpartum psychosis symptoms include:  Hallucinations and delusions.  Bizarre or disorganized behavior.  Confusion or disorientation. DIAGNOSIS  A diagnosis is made by an evaluation of your symptoms. There are no medical or lab tests that lead to a diagnosis, but there are various questionnaires that a caregiver may use  to identify those with the baby blues, postpartum depression, or psychosis. Often times, a screening tool called the [place] Postnatal Depression Scale is used to diagnose depression in the postpartum period.  TREATMENT The baby blues usually goes away on its own in 1 to 2 weeks. Social support is often all that is needed. You should be encouraged to get adequate sleep and rest. Occasionally, you may be given medicines to help you sleep.  Postpartum depression requires treatment as it can last several months or longer if it is not treated. Treatment may include individual or group therapy, medicine, or both to address any social, physiological, and psychological factors that may play a role in the depression. Regular exercise, a healthy diet, rest, and social support may also be strongly recommended.  Postpartum psychosis is more serious and needs treatment right away. Hospitalization is often needed. HOME CARE  INSTRUCTIONS  Get as much rest as you can. Nap when the baby sleeps.  Exercise regularly. Some women find yoga and walking to be beneficial.  Eat a balanced and nourishing diet.  Do little things that you enjoy. Have a cup of tea, take a bubble bath, read your favorite magazine, or listen to your favorite music.  Avoid alcohol.  Ask for help with household chores, cooking, grocery shopping, or running errands as needed. Do not try to do everything.  Talk to people close to you about how you are feeling. Get support from your partner, family members, friends, or other new moms.  Try to stay positive in how you think. Think about the things you are grateful for.  Do not spend a lot of time alone.  Only take medicine as directed by your caregiver.  Keep all your postpartum appointments.  Let your caregiver know if you have any concerns. SEEK MEDICAL CARE IF: You are having a reaction or problems with your medicine. SEEK IMMEDIATE MEDICAL CARE IF:  You have suicidal  feelings.  You feel you may harm the baby or someone else. Document Released: 01/12/2004 Document Revised: 07/02/2011 Document Reviewed: 02/13/2011 [facility] Patient Information 2014 [place], [place].  Discharge to: home  Follow-up Information   Follow up with [facility] In 6 weeks. (Call with any questions or concerns.)    Specialty:  Obstetrics and Gynecology   Contact information:   [address] [place] [address] [phone]       Haroldine Laws 04/03/2013

## 2013-04-03 NOTE — Anesthesia Postprocedure Evaluation (Signed)
  Anesthesia Post-op Note  Patient: Jessica Roberson  Procedure(s) Performed: Procedure(s): REPEAT CESAREAN SECTION (N/A)  Patient Location: Mother/Baby  Anesthesia Type:Epidural  Level of Consciousness: awake  Airway and Oxygen Therapy: Patient Spontanous Breathing  Post-op Pain: mild  Post-op Assessment: Patient's Cardiovascular Status Stable and Respiratory Function Stable  Post-op Vital Signs: stable  Complications: No apparent anesthesia complications

## 2013-04-03 NOTE — Progress Notes (Signed)
Patient was referred for history of depression/anxiety. * Referral screened out by Clinical Social Worker because none of the following criteria appear to apply:  ~ History of anxiety/depression during this pregnancy, or of post-partum depression.  ~ Diagnosis of anxiety and/or depression within last 3 years, per chart review.  ~ History of depression due to pregnancy loss/loss of child  OR * Patient's symptoms currently being treated with medication and/or therapy.  Please contact the Clinical Social Worker if needs arise, or by the patient's request. 

## 2013-04-03 NOTE — Progress Notes (Signed)
Subjective: Postpartum Day 1: Repeat Cesarean Delivery Patient up ad lib, reports no syncope or dizziness. Feeding:  Breastfeeding Contraceptive plan:  Not discussed  Objective: Vital signs in last 24 hours: Temp:  [97.2 F (36.2 C)-98.5 F (36.9 C)] 97.8 F (36.6 C) (12/12 0900) Pulse Rate:  [63-156] 82 (12/12 0900) Resp:  [16-25] 20 (12/12 0900) BP: (88-132)/(55-96) 123/71 mmHg (12/12 0900) SpO2:  [94 %-100 %] 99 % (12/12 0900)  Physical Exam:  General: alert, cooperative and no distress Lochia: appropriate Uterine Fundus: firm Incision: healing well DVT Evaluation: No evidence of DVT seen on physical exam. Negative Homan's sign. JP drain:   n/a   Recent Labs  03/31/13 1345 04/03/13 0630  HGB 12.6 10.2*  HCT 35.9* 29.9*    Assessment/Plan: Status post Cesarean section day 1. Doing well postoperatively.  Anemia - FE ordered Continue current care. Plan for discharge tomorrow (or Peri Jefferson 04/03/2013, 11:37 AM

## 2013-04-04 LAB — TYPE AND SCREEN
ABO/RH(D): O POS
Antibody Screen: NEGATIVE
Unit division: 0

## 2013-04-04 MED ORDER — POLYSACCHARIDE IRON COMPLEX 150 MG PO CAPS: 150.0000 mg | ORAL_CAPSULE | Freq: Every day | ORAL | Status: DC

## 2013-04-04 MED ORDER — HYDROCODONE-ACETAMINOPHEN 5-325 MG PO TABS
1.0000 | ORAL_TABLET | Freq: Four times a day (QID) | ORAL | Status: DC | PRN
  Administered 2013-04-04: 2 via ORAL
  Administered 2013-04-04 (×2): 1 via ORAL
  Filled 2013-04-04: qty 1
  Filled 2013-04-04 (×2): qty 2
  Filled 2013-04-04: qty 1

## 2013-04-04 MED ORDER — HYDROCODONE-ACETAMINOPHEN 5-325 MG PO TABS: 1.0000 | ORAL_TABLET | Freq: Four times a day (QID) | ORAL | Status: DC | PRN

## 2013-04-04 NOTE — Lactation Note (Signed)
This note was copied from the chart of Boy Reita Denes. Lactation Consultation Note  Patient Name: Boy Sway Guttierrez [id number] Date: 04/04/2013 Reason for consult: Follow-up assessment RN - MBU , concerned about baby latches.  Mom called with feeding cues , and LC had mom latch baby  Noted to be shallow at 1st , she relatched with instruction and assist and did better.  Baby would only feed for a few minutes and release and become very fussy. LC suggested due to weight loss to add a  SNS 38F feeding tube , baby still to fussy . LC had to finger feed 2 ml to help home calm down , then latch, and add 38F feeding tube  Baby latched with depth and tolerated 38F feeding tube and fed 20 -25 mins and took 18 ml of formula,noted to be calm with increased swallows  Released on his own. Mom plans to go home tonight , obtained [facility] loaner DEBP from this [facility] with instructions, and mom and dad know to supplement  With each feeding 20 ml of EBM or formula , until the weight increases , and baby is more satisfied and calmer with latch. Mom knows to post pump  10 mins both breast and save milk and use it in SNS . Showed dad how to clean SNS. LC suggested O/P apt this coming week , mom requested Tuesday  At this point all apt. Filled. LC asked mom to call back Sun. Or Mon. For cancellations.  Mom  and dad seem very comfortable with the plan.    Maternal Data Has patient been taught Hand Expression?: Yes  Feeding Feeding Type: Breast Fed Length of feed: 20 min  LATCH Score/Interventions Latch: Grasps breast easily, tongue down, lips flanged, rhythmical sucking. Intervention(s): Adjust position;Assist with latch;Breast massage  Audible Swallowing: Spontaneous and intermittent (with SNS )  Type of Nipple: Everted at rest and after stimulation  Comfort (Breast/Nipple): Soft / non-tender     Hold (Positioning): Assistance needed to correctly position infant at breast and maintain  latch. Intervention(s): Breastfeeding basics reviewed;Support Pillows;Position options;Skin to skin  LATCH Score: 9  Lactation Tools Discussed/Used Tools: Pump Breast pump type: Double-Electric Breast Pump WIC Program: Yes [facility] loaner pump with LC ) Pump Review: Setup, frequency, and cleaning;Milk Storage Initiated by:: by MBU RN  Date initiated:: 04/04/13   Consult Status Consult Status: Follow-up (unable to schedule mom will call Sunday and Monday to schedule ) Date: 04/04/13 Follow-up type: Out-patient    Kathrin Greathouse 04/04/2013, 6:39 PM

## 2013-04-04 NOTE — Progress Notes (Signed)
Subjective: Postpartum Day 2: Repeat Cesarean Delivery  Patient up ad lib, reports no syncope or dizziness. Feeding:  Breastfeeding Contraceptive plan:  Unknown  Objective: Vital signs in last 24 hours: Temp:  [97.8 F (36.6 C)-98.2 F (36.8 C)] 98.1 F (36.7 C) (12/13 0618) Pulse Rate:  [78-86] 83 (12/13 0618) Resp:  [16-20] 18 (12/13 0618) BP: (117-138)/(71-83) 138/83 mmHg (12/13 0618) SpO2:  [99 %-100 %] 100 % (12/13 0618)  Physical Exam:  General: alert, cooperative and no distress Lochia: appropriate Uterine Fundus: firm Incision: healing well DVT Evaluation: No evidence of DVT seen on physical exam. Negative Homan's sign. JP drain:   n/a   Recent Labs  04/03/13 0630  HGB 10.2*  HCT 29.9*    Assessment/Plan: Status post Cesarean section day 2. Is not taking pain medication because it makes her "sleepy" - will change to Norco which she says she tolerates better Cluster feeding last night, pt feels she needs more help from [facility]  Doing well postoperatively.  Continue current care. Discharge home later today or tomorrow    Jessica Roberson 04/04/2013, 7:27 AM

## 2013-08-11 DIAGNOSIS — M25569 Pain in unspecified knee: Secondary | ICD-10-CM | POA: Insufficient documentation

## 2013-08-12 ENCOUNTER — Ambulatory Visit
Admission: RE | Admit: 2013-08-12 | Discharge: 2013-08-12 | Disposition: A | Discharge status: Home / Self Care | Payer: Medicaid Other | Source: Ambulatory Visit | Attending: Orthopedic Surgery | Admitting: Orthopedic Surgery

## 2013-08-12 ENCOUNTER — Other Ambulatory Visit: Payer: Self-pay | Admitting: Orthopedic Surgery

## 2013-08-12 DIAGNOSIS — M25561 Pain in right knee: Secondary | ICD-10-CM

## 2013-10-02 DIAGNOSIS — R202 Paresthesia of skin: Secondary | ICD-10-CM | POA: Insufficient documentation

## 2013-10-02 DIAGNOSIS — M67439 Ganglion, unspecified wrist: Secondary | ICD-10-CM | POA: Insufficient documentation

## 2013-10-02 DIAGNOSIS — G5600 Carpal tunnel syndrome, unspecified upper limb: Secondary | ICD-10-CM | POA: Insufficient documentation

## 2013-12-31 HISTORY — PX: CARPAL TUNNEL RELEASE: SHX101

## 2014-02-22 ENCOUNTER — Encounter (HOSPITAL_COMMUNITY): Payer: Self-pay | Admitting: Obstetrics and Gynecology

## 2014-08-25 ENCOUNTER — Telehealth: Payer: Self-pay | Admitting: Neurology

## 2014-08-25 NOTE — Telephone Encounter (Signed)
[place] with [facility] is calling to get a verbal order

## 2014-08-25 NOTE — Telephone Encounter (Signed)
[place] with [facility] is calling to get a verbal order for Gilenya. Thank you.

## 2014-08-25 NOTE — Telephone Encounter (Signed)
I have spoken with [facility]. Suman has not been seen in this office.  Verbal rx. for 90 day supply of Gilenya given, and will need to see Terrea in the office before rx. can be r/f.  I also spoke with Courtnee and explained RAS needs to see her in the new office.  She is agreeable--appt. given for Monday, 09-27-14 at 0920, arrival time of 0900.  I have requested old mri brain/c-spine from [facility]

## 2014-09-16 ENCOUNTER — Telehealth: Payer: Self-pay | Admitting: Neurology

## 2014-09-16 MED ORDER — TIZANIDINE HCL 4 MG PO TABS
4.0000 mg | ORAL_TABLET | Freq: Three times a day (TID) | ORAL | Status: DC | PRN
Start: 2014-09-16 — End: 2014-09-27

## 2014-09-16 NOTE — Telephone Encounter (Signed)
I have spoken with Jessica Roberson and confirmed that she takes Tizanidine 4mg  up to tid prn.  [facility].  I have confirmed her appt. with RAS on 09-27-14 at 0845/fm

## 2014-09-16 NOTE — Telephone Encounter (Signed)
Pt called and requested a refill on Rx. TIZANADINE. Please call and advise.   [id number] [place]

## 2014-09-27 ENCOUNTER — Telehealth: Payer: Self-pay | Admitting: *Deleted

## 2014-09-27 ENCOUNTER — Encounter: Payer: Self-pay | Admitting: Neurology

## 2014-09-27 ENCOUNTER — Other Ambulatory Visit: Payer: Self-pay | Admitting: Neurology

## 2014-09-27 ENCOUNTER — Ambulatory Visit (INDEPENDENT_AMBULATORY_CARE_PROVIDER_SITE_OTHER): Payer: Medicaid Other | Admitting: Neurology

## 2014-09-27 VITALS — BP 134/76 | HR 74 | Resp 16 | Ht 69.0 in | Wt 266.4 lb

## 2014-09-27 DIAGNOSIS — R200 Anesthesia of skin: Secondary | ICD-10-CM | POA: Diagnosis not present

## 2014-09-27 DIAGNOSIS — R269 Unspecified abnormalities of gait and mobility: Secondary | ICD-10-CM | POA: Insufficient documentation

## 2014-09-27 DIAGNOSIS — Z79899 Other long term (current) drug therapy: Secondary | ICD-10-CM | POA: Insufficient documentation

## 2014-09-27 DIAGNOSIS — G35 Multiple sclerosis: Secondary | ICD-10-CM

## 2014-09-27 MED ORDER — TIZANIDINE HCL 4 MG PO TABS: 4.0000 mg | ORAL_TABLET | Freq: Three times a day (TID) | ORAL | Status: DC | PRN

## 2014-09-27 MED ORDER — FINGOLIMOD HCL 0.5 MG PO CAPS
0.5000 mg | ORAL_CAPSULE | Freq: Every day | ORAL | Status: DC
Start: 2014-09-27 — End: 2015-12-15

## 2014-09-27 NOTE — Progress Notes (Signed)
[facility]  PATIENT: Jessica Roberson DOB: Feb 04, 1981  REFERRING DOCTOR OR PCP:  Debbrah Alar SOURCE: patient  _________________________________   HISTORICAL  CHIEF COMPLAINT:  Chief Complaint  Patient presents with  . Multiple Sclerosis    Sts. she tolerates Gilenya well.  Sts. fatigue, h/a are some worse in the warmer weather.  Otherwise, she denies new or worsening sx./fim    HISTORY OF PRESENT ILLNESS:  Jessica Roberson is a 34 yo woman who was diagnosed with multiple sclerosis in 2006 after presenting with left facial numbness and vision changes. MRIs of the brain were consistent with MS. She also had a lumbar puncture in the spinal fluid was consistent with MS. She initially saw a Dr. at [facility] neurologic and then began to see Dr. [name] at [facility].   She was placed on Betaseron but switched to Copaxone due to injection site reactions. Unfortunately, she also had injection site reactions on Copaxone. Around 2010, she was started on Tysabri.  Due to a concern about PML, she wished to switch therapy. She was screened for a drug study but could not get the MRI's due to braces. She started Gilenya in 2012. Done well on that therapy she stopped twice, once for insurance reasons and once for pregnancy and resumed therapy after each pause. She has not had any definite exacerbations while on Gilenya. She tolerates it very well. She has not had any recent MRIs.  Gait/strength/sensation: She reports no significant difficulty with her gait. She does have heat intolerance and becomes more fatigued if she walks along her time in the heat.  She denies any significant weakness. She does have some numbness on the inside lower right leg and used to have numbness on the outer right leg. The sensory symptoms get worse if she walks longer. She notes some spasticity in her neck and back. Tizanidine helps. She takes 2 or 3 pills a day depending on the level of spasticity.     Bowel/bladder: She denies any significant change in bowel or bladder function. She denies any urinary frequency or hesitancy and has not had urinary tract infections recently.  Vision: She started out with some vision problems back in 2006 but reports no recent issues with her vision and feels she is back at baseline.  Fatigue/sleep: She does not note any significant problem with fatigue many days but has more issues when the weather is hot especially when she is outside more. She does not note any difficulty with her sleep.  Mood/cognition: She denies any depression or anxiety. She denies any significant issues with memory or other cognitive aspects.  Headache: She gets a dull headache that lasts much of the day when she gets hot. Her more recently with change in temperatures. She does not require any medications for this.  REVIEW OF SYSTEMS: Constitutional: No fevers, chills, sweats, or change in appetite.  Fatigue when hot.    Eyes: No visual changes, double vision, eye pain Ear, nose and throat: No hearing loss, ear pain, nasal congestion, sore throat Cardiovascular: No chest pain, palpitations Respiratory: No shortness of breath at rest or with exertion.   No wheezes GastrointestinaI: No nausea, vomiting, diarrhea, abdominal pain, fecal incontinence Genitourinary: No dysuria, urinary retention or frequency.  No nocturia. Musculoskeletal: No neck pain, back pain Integumentary: No rash, pruritus, skin lesions Neurological: as above Psychiatric: No depression at this time.  No anxiety Endocrine: No palpitations, diaphoresis, change in appetite, change in weigh or increased thirst Hematologic/Lymphatic: No  anemia, purpura, petechiae. Allergic/Immunologic: No itchy/runny eyes, nasal congestion, recent allergic reactions, rashes  ALLERGIES: Allergies  Allergen Reactions  . Aspirin Anaphylaxis  . Penicillins Anaphylaxis  . Cyclobenzaprine Itching  . Hydromorphone Hcl Itching  .  Tramadol Hcl Hives    HOME MEDICATIONS:  Current outpatient prescriptions:  .  Fingolimod HCl 0.5 MG CAPS, Take 0.5 mg by mouth daily., Disp: , Rfl:  .  tiZANidine (ZANAFLEX) 4 MG tablet, Take 1 tablet (4 mg total) by mouth 3 (three) times daily as needed for muscle spasms., Disp: 90 tablet, Rfl: 0 .  [DISCONTINUED] omeprazole-sodium bicarbonate (ZEGERID) 40-1100 MG per capsule, Take 1 capsule by mouth daily., Disp: 30 capsule, Rfl: 5  PAST MEDICAL HISTORY: Past Medical History  Diagnosis Date  . Allergy   . Multiple sclerosis   . Anal fissure   . Obesity   . Ulcer 09/2009  . Colon polyp 2005  . Multiple sclerosis   . PCOS (polycystic ovarian syndrome)   . Pregnancy induced hypertension 03/2007    Resolved - [place] with 2008 pregnancy   . Depression     resolved - situational  . Migraine     history - otc med prn  . Neuromuscular disorder     Multiple sclerosis - no meds with 2014 pregnancy  . Arthritis     right knee  . Vision abnormalities     PAST SURGICAL HISTORY: Past Surgical History  Procedure Laterality Date  . Cesarean section  2008    wh  . Cholecystectomy  2007  . Knee arthroscopy      x 3 - right knee  . Foot surgery  2956,2130    x 2 right foot  . Hernia repair  1984  . Hammer toe surgery  2011  . Foot tendon transfer  2004  . Dilation and evacuation  05/05/2012    Procedure: DILATATION AND EVACUATION;  Surgeon: Alwyn Pea, MD;  Location: [facility];  Service: Gynecology;  Laterality: N/A;  . Dilation and curettage of uterus    . Cesarean section N/A 04/02/2013    Procedure: REPEAT CESAREAN SECTION;  Surgeon: Eldred Manges, MD;  Location: [facility];  Service: Obstetrics;  Laterality: N/A;    FAMILY HISTORY: Family History  Problem Relation Age of Onset  . Stroke Mother   . Kidney disease Mother     ESRD  . Cancer Mother     ovarian, pancreatic,stomach,uterine,thyroid  . Multiple sclerosis Mother   . Depression Mother   . Hypertension Father     . Diabetes Father     type II  . Sarcoidosis Father   . Asthma Daughter   . Hypertension Maternal Grandmother   . Diabetes Maternal Grandmother   . Diabetes Paternal Grandfather   . Cancer Other     multiple aunts w/ breast cancer    SOCIAL HISTORY:  History   Social History  . Marital Status: Married    Spouse Name: N/A  . Number of Children: N/A  . Years of Education: N/A   Occupational History  . Not on file.   Social History Main Topics  . Smoking status: Never Smoker   . Smokeless tobacco: Never Used  . Alcohol Use: 0.0 oz/week     Comment: occasional but none with pregnancy  . Drug Use: No  . Sexual Activity: Yes    Birth Control/ Protection: None     Comment: preganat   Other Topics Concern  . Not on file   Social  History Narrative   Currently at [employer]; wants to enroll in RN program.   Lives with boyfriend   Regular exercise: yes     PHYSICAL EXAM  Filed Vitals:   09/27/14 0904  BP: 134/76  Pulse: 74  Resp: 16  Height: 5\' 9"  (1.753 m)  Weight: 266 lb 6.4 oz (120.838 kg)    Body mass index is 39.32 kg/(m^2).   General: The patient is well-developed and well-nourished and in no acute distress  Eyes:  Funduscopic exam difficult due to astigmatism..  Neck: The neck is supple, no carotid bruits are noted.  The neck is nontender.  Cardiovascular: The heart has a regular rate and rhythm with a normal S1 and S2. There were no murmurs, gallops or rubs. Lungs are clear to auscultation.  Skin: Extremities are without significant edema.  Musculoskeletal:  Back is nontender  Neurologic Exam  Mental status: The patient is alert and oriented x 3 at the time of the examination. The patient has apparent normal recent and remote memory, with an apparently normal attention span and concentration ability.   Speech is normal.  Cranial nerves: Extraocular movements are full. Pupils are equal, round, and reactive to light and  accomodation.  Visual fields are full.  Facial symmetry is present. There is good facial sensation to soft touch bilaterally.Facial strength is normal.  Trapezius and sternocleidomastoid strength is normal. No dysarthria is noted.  The tongue is midline, and the patient has symmetric elevation of the soft palate. No obvious hearing deficits are noted.  Motor:  Muscle bulk is normal.   Tone is mildly increased in right leg. Strength is  5 / 5 in all 4 extremities except 4+/5 right foot/ankle..   Sensory: Sensory testing is intact to pinprick, soft touch and vibration sensation in all 4 extremities.  Coordination: Cerebellar testing reveals good finger-nose-finger and heel-to-shin bilaterally.  Gait and station: Station is normal.   Gait has minimal right foot drop. Tandem gait is mildly wide. Romberg is negative.   Reflexes: Deep tendon reflexes are symmetric and normal bilaterally.   Plantar responses are flexor.    DIAGNOSTIC DATA (LABS, IMAGING, TESTING) - I reviewed patient records, labs, notes, testing and imaging myself where available.  Lab Results  Component Value Date   WBC 6.9 04/03/2013   HGB 10.2* 04/03/2013   HCT 29.9* 04/03/2013   MCV 75.7* 04/03/2013   PLT 161 04/03/2013      Component Value Date/Time   NA 135 01/24/2012 2210   K 3.8 01/24/2012 2210   CL 99 01/24/2012 2210   CO2 25 01/24/2012 2210   GLUCOSE 84 01/24/2012 2210   BUN 9 01/24/2012 2210   CREATININE 0.80 01/24/2012 2210   CALCIUM 9.1 01/24/2012 2210   PROT 7.6 10/31/2009 1830   ALBUMIN 4.5 10/31/2009 1830   AST 17 10/31/2009 1830   ALT 28 10/31/2009 1830   ALKPHOS 49 10/31/2009 1830   BILITOT 0.4 10/31/2009 1830   GFRNONAA >90 01/24/2012 2210   GFRAA >90 01/24/2012 2210   No results found for: CHOL, HDL, LDLCALC, LDLDIRECT, TRIG, CHOLHDL Lab Results  Component Value Date   HGBA1C 5.7* 12/09/2009   Lab Results  Component Value Date   VITAMINB12 330 09/27/2009   Lab Results  Component  Value Date   TSH 0.833 05/15/2010       ASSESSMENT AND PLAN  Multiple sclerosis - Plan: MR Brain W Wo Contrast, CBC with Differential/Platelet, Comprehensive metabolic panel  Gait disturbance  Numbness  High risk medication use   In summary, Terree Gagan is a 34 year old woman with MS who has done well on Gilenya therapy. We need to obtain an MRI of the brain to rule out subclinical progression that might have occurred. If this is occurring, we will need to consider another therapy. Additionally, we need to check a CBC and CMP to rule out lymphopenia and hepatotoxicity.   She will continue on tizanidine for her spasticity. A refill was provided.  She will return in 6 months or sooner if she has new or worsening neurologic symptoms.   Dayshaun Whobrey A. Felecia Shelling, MD, PhD 11/26/7617, 5:09 AM Certified in Neurology, Clinical Neurophysiology, Sleep Medicine, Pain Medicine and Neuroimaging  [facility] [address], [place], [place] [address] [phone]

## 2014-09-27 NOTE — Telephone Encounter (Signed)
Release faxed to [facility] on 10/07/14.

## 2014-09-28 ENCOUNTER — Telehealth: Payer: Self-pay

## 2014-09-28 LAB — CBC WITH DIFFERENTIAL/PLATELET
BASOS ABS: 0 10*3/uL (ref 0.0–0.2)
BASOS: 0 %
EOS (ABSOLUTE): 0.1 10*3/uL (ref 0.0–0.4)
EOS: 3 %
HEMATOCRIT: 39.5 % (ref 34.0–46.6)
HEMOGLOBIN: 12.5 g/dL (ref 11.1–15.9)
IMMATURE GRANULOCYTES: 0 %
Immature Grans (Abs): 0 10*3/uL (ref 0.0–0.1)
Lymphocytes Absolute: 0.2 10*3/uL — ABNORMAL LOW (ref 0.7–3.1)
Lymphs: 9 %
MCH: 26.2 pg — ABNORMAL LOW (ref 26.6–33.0)
MCHC: 31.6 g/dL (ref 31.5–35.7)
MCV: 83 fL (ref 79–97)
Monocytes Absolute: 0.3 10*3/uL (ref 0.1–0.9)
Monocytes: 9 %
Neutrophils Absolute: 2.2 10*3/uL (ref 1.4–7.0)
Neutrophils: 79 %
Platelets: 225 10*3/uL (ref 150–379)
RBC: 4.77 x10E6/uL (ref 3.77–5.28)
RDW: 17.6 % — AB (ref 12.3–15.4)
WBC: 2.8 10*3/uL — AB (ref 3.4–10.8)

## 2014-09-28 LAB — COMPREHENSIVE METABOLIC PANEL
A/G RATIO: 1.7 (ref 1.1–2.5)
ALK PHOS: 43 IU/L (ref 39–117)
ALT: 10 IU/L (ref 0–32)
AST: 14 IU/L (ref 0–40)
Albumin: 4.3 g/dL (ref 3.5–5.5)
BUN / CREAT RATIO: 14 (ref 8–20)
BUN: 10 mg/dL (ref 6–20)
Bilirubin Total: 0.4 mg/dL (ref 0.0–1.2)
CALCIUM: 8.9 mg/dL (ref 8.7–10.2)
CO2: 24 mmol/L (ref 18–29)
CREATININE: 0.74 mg/dL (ref 0.57–1.00)
Chloride: 101 mmol/L (ref 97–108)
GFR calc Af Amer: 124 mL/min/{1.73_m2} (ref >59)
GFR, EST NON AFRICAN AMERICAN: 108 mL/min/{1.73_m2} (ref >59)
GLOBULIN, TOTAL: 2.6 g/dL (ref 1.5–4.5)
Glucose: 80 mg/dL (ref 65–99)
Potassium: 5.2 mmol/L (ref 3.5–5.2)
Sodium: 141 mmol/L (ref 134–144)
TOTAL PROTEIN: 6.9 g/dL (ref 6.0–8.5)

## 2014-09-28 NOTE — Telephone Encounter (Signed)
Patient informed of WBC and lymphocytes low (expected with Gilenya.  Has borderline anemia -- take an OTC iron supplement daily (325 mg Ferrous gluconate or similar).  Liver and kidney are fine.  She verbalizes understanding and has no questions at this time.

## 2014-09-29 ENCOUNTER — Other Ambulatory Visit: Payer: Medicaid Other

## 2014-09-30 ENCOUNTER — Encounter (INDEPENDENT_AMBULATORY_CARE_PROVIDER_SITE_OTHER): Payer: Medicaid Other | Admitting: Neurology

## 2014-09-30 ENCOUNTER — Ambulatory Visit
Admission: RE | Admit: 2014-09-30 | Discharge: 2014-09-30 | Disposition: A | Discharge status: Home / Self Care | Payer: Medicaid Other | Source: Ambulatory Visit | Attending: Neurology | Admitting: Neurology

## 2014-09-30 DIAGNOSIS — G35 Multiple sclerosis: Secondary | ICD-10-CM | POA: Diagnosis not present

## 2014-09-30 MED ORDER — GADOBENATE DIMEGLUMINE 529 MG/ML IV SOLN
20.0000 mL | Freq: Once | INTRAVENOUS | Status: AC | PRN
  Administered 2014-09-30: 20 mL via INTRAVENOUS

## 2014-10-01 ENCOUNTER — Telehealth: Payer: Self-pay | Admitting: *Deleted

## 2014-10-01 NOTE — Telephone Encounter (Signed)
I was speaking with Jessica Roberson this afternoon regarding mri results, was explaining that I have requested old mri from [facility] for comparison, and our connection was lost.  I tried calling Ranisha back but was not able to reach her.  As our office is now closed, I will try again on Monday/fim

## 2014-10-01 NOTE — Telephone Encounter (Signed)
-----   Message from Britt Bottom, MD sent at 10/01/2014  8:34 AM EDT ----- Erasmo Downer let her know no brand new lesions on MRI   ---  Lets see if we can get last [facility] brain MRI for comparison

## 2014-10-01 NOTE — Telephone Encounter (Signed)
Patient called/returning Jessica Roberson's call. Please call and advise. Patient can be reached @ [phone]

## 2014-10-01 NOTE — Telephone Encounter (Signed)
[place].  I have requested old mri brain and c-spine if there is one, from [facility]./fim

## 2014-10-04 ENCOUNTER — Telehealth: Payer: Self-pay | Admitting: *Deleted

## 2014-10-04 NOTE — Telephone Encounter (Signed)
I have spoken with Jessica Roberson this afternoon and completed our conversation from [place]

## 2014-10-04 NOTE — Telephone Encounter (Signed)
-----   Message from Britt Bottom, MD sent at 10/01/2014  8:34 AM EDT ----- Erasmo Downer let her know no brand new lesions on MRI   ---  Lets see if we can get last [facility] brain MRI for comparison

## 2014-10-04 NOTE — Telephone Encounter (Signed)
I spoke with Jessica Roberson on Friday (10-01-14) and let her know mri did not show any brand new lesions. She verbalized understanding of same.  I have ordered cd of last mri from [facility] for comparison/fim

## 2014-12-06 ENCOUNTER — Telehealth: Payer: Self-pay | Admitting: Neurology

## 2014-12-06 NOTE — Telephone Encounter (Signed)
Jessica Roberson with [facility] called requesting refill for Gilenya. She can be reached at [phone].

## 2014-12-06 NOTE — Telephone Encounter (Signed)
It appears a 1 year Rx was sent on 06/06.  I called back.  Spoke with [employer]  She placed me on hold, then the call disconnected.  I called again.  Spoke with [employer].  She placed me on hold for several minutes.  They were having difficulty locating the patient.  She is listed as Jessica Roberson (instead of [name]) in their system.  They were not able to locate the Rx sent on 06/06, likely because of the name discrepancy.  I was transferred to the pharmacist.  Verified order verbally.  They will proceed with Rx as prescribed.

## 2015-02-14 ENCOUNTER — Telehealth: Payer: Self-pay | Admitting: Neurology

## 2015-02-14 ENCOUNTER — Encounter: Payer: Self-pay | Admitting: *Deleted

## 2015-02-14 NOTE — Telephone Encounter (Signed)
I have spoken with Jessica Roberson this afternoon.  She sts. student health has told her she has to have one of their waiver forms completed by our office, not a letter.  She will have them fax waiver to me at [phone]./WGY

## 2015-02-14 NOTE — Telephone Encounter (Signed)
Patient is calling. She states she needs a letter stating that she is on Gilenya and cannot take live vaccines. Please call patient when ready for pickup. Thank you.

## 2015-02-16 NOTE — Telephone Encounter (Signed)
I spoke with Jessica Roberson this morning and advised I have not received waiver.  She will email her HR dept. again/fim

## 2015-03-23 ENCOUNTER — Encounter: Payer: Self-pay | Admitting: Neurology

## 2015-03-23 ENCOUNTER — Ambulatory Visit (INDEPENDENT_AMBULATORY_CARE_PROVIDER_SITE_OTHER): Payer: Medicaid Other | Admitting: Neurology

## 2015-03-23 ENCOUNTER — Telehealth: Payer: Self-pay | Admitting: Neurology

## 2015-03-23 VITALS — BP 120/84 | HR 68 | Resp 16 | Ht 69.0 in | Wt 266.0 lb

## 2015-03-23 DIAGNOSIS — R269 Unspecified abnormalities of gait and mobility: Secondary | ICD-10-CM | POA: Diagnosis not present

## 2015-03-23 DIAGNOSIS — H469 Unspecified optic neuritis: Secondary | ICD-10-CM

## 2015-03-23 DIAGNOSIS — G35 Multiple sclerosis: Secondary | ICD-10-CM

## 2015-03-23 DIAGNOSIS — R200 Anesthesia of skin: Secondary | ICD-10-CM

## 2015-03-23 NOTE — Telephone Encounter (Signed)
Patient called to advise she has been having problems with vision, "Left eye, hazy blurry blob on the side, peripheral vision gone, pain on Right side of head, dull nagging pain, doesn't feel need to take any kind of medicine for this, it's just there".

## 2015-03-23 NOTE — Telephone Encounter (Signed)
I have spoken with Jessica Roberson this morning--she c/o blurry vision left eye (peripheral), and right sided h/a.  Appt. given 4pm today with RAS/fim

## 2015-03-23 NOTE — Progress Notes (Signed)
[facility]  PATIENT: Jessica Roberson DOB: 1980/11/20  REFERRING DOCTOR OR PCP:  Debbrah Alar SOURCE: patient  _________________________________   HISTORICAL  CHIEF COMPLAINT:  Chief Complaint  Patient presents with  . Multiple Sclerosis    Sts. onset of blurry peripheral vison left eye, and right sided h/a last pm.  No pain left eye.  Visual acuity right ey 20/30, left eye 20/50./fim  . Visual Disturbance    HISTORY OF PRESENT ILLNESS:  Jessica Roberson is a 34 yo woman with MS who had the onset of left visual disturbance yesterday. She noted yesterday evening that there was decreased vision out of the left eye. Specifically she noted a crescent shaped grey spots in her vision that developed. Compared to yesterday, she feels her vision is about the same. She denies any problems with the right eye. She denies any pain at rest or with eye movements.  Of note, in 2006, at the onset of her MS, she had visual problems as well.  Gait/strength/sensation: She feels her gait is doing well in general.  She denies any significant weakness. She has some numbness on the lower right leg at times. She notes some spasticity in her neck and back. Tizanidine helps. She takes 2 or 3 pills a day depending on the level of spasticity.    Bowel/bladder: She denies any significant change in bowel or bladder function. She denies any urinary frequency or hesitancy and has not had urinary tract infections recently.   Fatigue/sleep: She does not note any significant problem with fatigue but does note some heat intolerance during the summer when she is outdoors.. She does not note any difficulty with her sleep.  Mood/cognition: She denies any depression or anxiety. She denies any significant issues with memory or other cognitive aspects.   MS History:   She was diagnosed with multiple sclerosis in 2006 after presenting with left facial numbness and vision changes. MRIs of the brain  were consistent with MS. She also had a lumbar puncture in the spinal fluid was consistent with MS. She initially saw a Dr. at [facility] neurologic and then began to see Dr. [name] at [facility].   She was placed on Betaseron but switched to Copaxone due to injection site reactions. Unfortunately, she also had injection site reactions on Copaxone. Around 2010, she was started on Tysabri.  Due to a concern about PML, she wished to switch therapy. She was screened for a drug study but could not get the MRI's due to braces. She started Gilenya in 2012. Done well on that therapy she stopped twice, once for insurance reasons and once for pregnancy and resumed therapy after each pause. She has not had any definite exacerbations while on Gilenya. She tolerates it very well. She has not had any recent MRIs.   REVIEW OF SYSTEMS: Constitutional: No fevers, chills, sweats, or change in appetite.  Fatigue when hot.    Eyes: No visual changes, double vision, eye pain Ear, nose and throat: No hearing loss, ear pain, nasal congestion, sore throat Cardiovascular: No chest pain, palpitations Respiratory: No shortness of breath at rest or with exertion.   No wheezes GastrointestinaI: No nausea, vomiting, diarrhea, abdominal pain, fecal incontinence Genitourinary: No dysuria, urinary retention or frequency.  No nocturia. Musculoskeletal: No neck pain, back pain Integumentary: No rash, pruritus, skin lesions Neurological: as above Psychiatric: No depression at this time.  No anxiety Endocrine: No palpitations, diaphoresis, change in appetite, change in weigh or increased thirst Hematologic/Lymphatic:  No anemia, purpura, petechiae. Allergic/Immunologic: No itchy/runny eyes, nasal congestion, recent allergic reactions, rashes  ALLERGIES: Allergies  Allergen Reactions  . Aspirin Anaphylaxis  . Penicillins Anaphylaxis  . Cyclobenzaprine Itching  . Hydromorphone Hcl Itching  . Tramadol Hcl Hives    HOME  MEDICATIONS:  Current outpatient prescriptions:  .  Fingolimod HCl 0.5 MG CAPS, Take 1 capsule (0.5 mg total) by mouth daily., Disp: 90 capsule, Rfl: 3 .  tiZANidine (ZANAFLEX) 4 MG tablet, Take 1 tablet (4 mg total) by mouth 3 (three) times daily as needed for muscle spasms., Disp: 90 tablet, Rfl: 11 .  [DISCONTINUED] omeprazole-sodium bicarbonate (ZEGERID) 40-1100 MG per capsule, Take 1 capsule by mouth daily., Disp: 30 capsule, Rfl: 5  PAST MEDICAL HISTORY: Past Medical History  Diagnosis Date  . Allergy   . Multiple sclerosis ([place])   . Anal fissure   . Obesity   . Ulcer 09/2009  . Colon polyp 2005  . Multiple sclerosis ([place])   . PCOS (polycystic ovarian syndrome)   . Pregnancy induced hypertension 03/2007    Resolved - [place] with 2008 pregnancy   . Depression     resolved - situational  . Migraine     history - otc med prn  . Neuromuscular disorder ([place])     Multiple sclerosis - no meds with 2014 pregnancy  . Arthritis     right knee  . Vision abnormalities     PAST SURGICAL HISTORY: Past Surgical History  Procedure Laterality Date  . Cesarean section  2008    wh  . Cholecystectomy  2007  . Knee arthroscopy      x 3 - right knee  . Foot surgery  [id number]    x 2 right foot  . Hernia repair  1984  . Hammer toe surgery  2011  . Foot tendon transfer  2004  . Dilation and evacuation  05/05/2012    Procedure: DILATATION AND EVACUATION;  Surgeon: Alwyn Pea, MD;  Location: [facility];  Service: Gynecology;  Laterality: N/A;  . Dilation and curettage of uterus    . Cesarean section N/A 04/02/2013    Procedure: REPEAT CESAREAN SECTION;  Surgeon: Eldred Manges, MD;  Location: [facility];  Service: Obstetrics;  Laterality: N/A;    FAMILY HISTORY: Family History  Problem Relation Age of Onset  . Stroke Mother   . Kidney disease Mother     ESRD  . Cancer Mother     ovarian, pancreatic,stomach,uterine,thyroid  . Multiple sclerosis Mother   . Depression Mother   .  Hypertension Father   . Diabetes Father     type II  . Sarcoidosis Father   . Asthma Daughter   . Hypertension Maternal Grandmother   . Diabetes Maternal Grandmother   . Diabetes Paternal Grandfather   . Cancer Other     multiple aunts w/ breast cancer    SOCIAL HISTORY:  Social History   Social History  . Marital Status: Married    Spouse Name: N/A  . Number of Children: N/A  . Years of Education: N/A   Occupational History  . Not on file.   Social History Main Topics  . Smoking status: Never Smoker   . Smokeless tobacco: Never Used  . Alcohol Use: 0.0 oz/week     Comment: occasional but none with pregnancy  . Drug Use: No  . Sexual Activity: Yes    Birth Control/ Protection: None     Comment: preganat   Other Topics Concern  .  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   Lives with boyfriend   Regular exercise: yes     PHYSICAL EXAM  Filed Vitals:   03/23/15 1629  BP: 120/84  Pulse: 68  Resp: 16  Height: 5\' 9"  (1.753 m)  Weight: 266 lb (120.657 kg)    Body mass index is 39.26 kg/(m^2).   General: The patient is well-developed and well-nourished and in no acute distress  Eyes:  Funduscopic exam difficult due to astigmatism.  However, the fundi appeared to be symmetric.  Neck: The neck is supple, no carotid bruits are noted.  The neck is nontender.  Cardiovascular: The heart has a regular rate and rhythm with a normal S1 and S2. There were no murmurs, gallops or rubs. Lungs are clear to auscultation.  Skin: Extremities are without significant edema.  Musculoskeletal:  Back is nontender  Neurologic Exam  Mental status: The patient is alert and oriented x 3 at the time of the examination. The patient has apparent normal recent and remote memory, with an apparently normal attention span and concentration ability.   Speech is normal.  Cranial nerves: Extraocular movements  are full. Pupils show a 2+ left APD. Color vision is desaturated out of the left eye. Visual acuity was 20/30 0D and 20/50 OS.    Facial symmetry is present. There is good facial sensation to soft touch bilaterally.Facial strength is normal.  Trapezius and sternocleidomastoid strength is normal. No dysarthria is noted.  The tongue is midline, and the patient has symmetric elevation of the soft palate. No obvious hearing deficits are noted.  Motor:  Muscle bulk is normal.   Tone is mildly increased in right leg. Strength is  5 / 5 in all 4 extremities except 4+/5 right foot/ankle..   Sensory: Sensory testing is intact to  touch and vibration sensation in all 4 extremities.  Coordination: Cerebellar testing reveals good finger-nose-finger and heel-to-shin bilaterally.  Gait and station: Station is normal.   Gait is good. Tandem gait is mildly wide. Romberg is negative.   Reflexes: Deep tendon reflexes are symmetric and normal bilaterally.      DIAGNOSTIC DATA (LABS, IMAGING, TESTING) - I reviewed patient records, labs, notes, testing and imaging myself where available.  Lab Results  Component Value Date   WBC 2.8* 09/27/2014   HGB 10.2* 04/03/2013   HCT 39.5 09/27/2014   MCV 75.7* 04/03/2013   PLT 161 04/03/2013      Component Value Date/Time   NA 141 09/27/2014 1000   NA 135 01/24/2012 2210   K 5.2 09/27/2014 1000   CL 101 09/27/2014 1000   CO2 24 09/27/2014 1000   GLUCOSE 80 09/27/2014 1000   GLUCOSE 84 01/24/2012 2210   BUN 10 09/27/2014 1000   BUN 9 01/24/2012 2210   CREATININE 0.74 09/27/2014 1000   CALCIUM 8.9 09/27/2014 1000   PROT 6.9 09/27/2014 1000   PROT 7.6 10/31/2009 1830   ALBUMIN 4.3 09/27/2014 1000   ALBUMIN 4.5 10/31/2009 1830   AST 14 09/27/2014 1000   ALT 10 09/27/2014 1000   ALKPHOS 43 09/27/2014 1000   BILITOT 0.4 09/27/2014 1000   BILITOT 0.4 10/31/2009 1830   GFRNONAA 108 09/27/2014 1000   GFRAA 124 09/27/2014 1000   No results found for: CHOL,  HDL, LDLCALC, LDLDIRECT, TRIG, CHOLHDL Lab Results  Component Value Date   HGBA1C 5.7* 12/09/2009   Lab Results  Component Value Date   VITAMINB12 330 09/27/2009   Lab Results  Component Value Date   TSH 0.833 05/15/2010       ASSESSMENT AND PLAN  Multiple sclerosis (HCC)  Gait disturbance  Numbness  Optic neuritis  1. She appears to have an acute optic neuritis on the left. She will get 1 g of IV Solu-Medrol today and return for 2 additional doses the rest of the week. 2.   We discussed that if she has another exacerbation or if she shows significant changes on MRI and we'll recheck in the Spring, that we would need to reconsider a different disease modifying therapy. However, she likes being on the [place] and would prefer to stay on that medication, if possible. 3.   She will continue to be active and exercises as tolerated  She will return in 4 months or sooner if she has new or worsening neurologic symptoms.   Richard A. Felecia Shelling, MD, PhD [id number], [id number] PM Certified in Neurology, Clinical Neurophysiology, Sleep Medicine, Pain Medicine and Neuroimaging  [facility] [address] [place], [place] [address] [phone]

## 2015-03-29 ENCOUNTER — Ambulatory Visit: Payer: Medicaid Other | Admitting: Neurology

## 2015-03-30 ENCOUNTER — Ambulatory Visit (INDEPENDENT_AMBULATORY_CARE_PROVIDER_SITE_OTHER): Payer: Medicaid Other | Admitting: Neurology

## 2015-03-30 ENCOUNTER — Encounter: Payer: Self-pay | Admitting: Neurology

## 2015-03-30 VITALS — BP 129/81 | HR 69 | Ht 69.0 in | Wt 274.8 lb

## 2015-03-30 DIAGNOSIS — R269 Unspecified abnormalities of gait and mobility: Secondary | ICD-10-CM | POA: Diagnosis not present

## 2015-03-30 DIAGNOSIS — G35 Multiple sclerosis: Secondary | ICD-10-CM | POA: Diagnosis not present

## 2015-03-30 DIAGNOSIS — H469 Unspecified optic neuritis: Secondary | ICD-10-CM | POA: Diagnosis not present

## 2015-03-30 NOTE — Progress Notes (Signed)
[facility]  PATIENT: Jessica Roberson DOB: 01/15/1981  REFERRING DOCTOR OR PCP:  Debbrah Alar SOURCE: patient  _________________________________   HISTORICAL  CHIEF COMPLAINT:  Chief Complaint  Patient presents with  . Follow-up    Rm 14 by herself. Feels better after IV steroids. No more blurry vision.     HISTORY OF PRESENT ILLNESS:  Jessica Roberson is a 34 yo woman with MS who had the onset of left visual disturbance last week c/w ON.      Optic Neuritis:   OS VA was 20/50 and colors were desaturated last week (with rapid onset of symptoms).    She received 3 days of IV Solu-Medrol and she reports her vision is much better now -  practically normal.    She denies any pain at rest or with eye movements.  Of note, in 2006, at the onset of her MS, she had visual problems as well.  Gait/strength/sensation: She feels her gait is doing well though she is off balanced with a tandem gait.    She denies significant weakness. She denies fixed numbness. She notes some spasticity in her neck and back. Tizanidine helps. She takes 2 or 3 pills a day depending on the level of spasticity.    Bowel/bladder: She denies any significant change in bowel or bladder function. She denies any urinary frequency or hesitancy.   No recent urinary tract infections recently.   Fatigue/sleep: She does not note any significant problem with fatigue but does note some heat intolerance during the summer when she is outdoors.. She does not note any difficulty with her sleep.  Mood/cognition: She denies any depression or anxiety. She denies any significant issues with memory or other cognitive aspects.   MS History:   She was diagnosed with multiple sclerosis in 2006 after presenting with left facial numbness and vision changes. MRIs of the brain were consistent with MS. She also had a lumbar puncture in the spinal fluid was consistent with MS. She initially saw a Dr. at [facility]  neurologic and then began to see Dr. [name] at [facility].   She was placed on Betaseron but switched to Copaxone due to injection site reactions. Unfortunately, she also had injection site reactions on Copaxone. Around 2010, she was started on Tysabri.  Due to a concern about PML, she wished to switch therapy. She was screened for a drug study but could not get the MRI's due to braces. She started Gilenya in 2012. Done well on that therapy she stopped twice, once for insurance reasons and once for pregnancy and resumed therapy after each pause. She has not had any definite exacerbations while on Gilenya. She tolerates it very well. She has not had any recent MRIs.   REVIEW OF SYSTEMS: Constitutional: No fevers, chills, sweats, or change in appetite.  Fatigue when hot.    Eyes: see above Ear, nose and throat: No hearing loss, ear pain, nasal congestion, sore throat Cardiovascular: No chest pain, palpitations Respiratory: No shortness of breath at rest or with exertion.   No wheezes GastrointestinaI: No nausea, vomiting, diarrhea, abdominal pain, fecal incontinence Genitourinary: No dysuria, urinary retention or frequency.  No nocturia. Musculoskeletal: No neck pain, back pain Integumentary: No rash, pruritus, skin lesions Neurological: as above Psychiatric: No depression at this time.  No anxiety Endocrine: No palpitations, diaphoresis, change in appetite, change in weigh or increased thirst Hematologic/Lymphatic: No anemia, purpura, petechiae. Allergic/Immunologic: No itchy/runny eyes, nasal congestion, recent allergic reactions, rashes  ALLERGIES:  Allergies  Allergen Reactions  . Aspirin Anaphylaxis  . Penicillins Anaphylaxis  . Cyclobenzaprine Itching  . Hydromorphone Hcl Itching  . Tramadol Hcl Hives    HOME MEDICATIONS:  Current outpatient prescriptions:  .  Fingolimod HCl 0.5 MG CAPS, Take 1 capsule (0.5 mg total) by mouth daily., Disp: 90 capsule, Rfl: 3 .  tiZANidine  (ZANAFLEX) 4 MG tablet, Take 1 tablet (4 mg total) by mouth 3 (three) times daily as needed for muscle spasms., Disp: 90 tablet, Rfl: 11 .  [DISCONTINUED] omeprazole-sodium bicarbonate (ZEGERID) 40-1100 MG per capsule, Take 1 capsule by mouth daily., Disp: 30 capsule, Rfl: 5  PAST MEDICAL HISTORY: Past Medical History  Diagnosis Date  . Allergy   . Multiple sclerosis ([place])   . Anal fissure   . Obesity   . Ulcer 09/2009  . Colon polyp 2005  . Multiple sclerosis ([place])   . PCOS (polycystic ovarian syndrome)   . Pregnancy induced hypertension 03/2007    Resolved - [place] with 2008 pregnancy   . Depression     resolved - situational  . Migraine     history - otc med prn  . Neuromuscular disorder ([place])     Multiple sclerosis - no meds with 2014 pregnancy  . Arthritis     right knee  . Vision abnormalities     PAST SURGICAL HISTORY: Past Surgical History  Procedure Laterality Date  . Cesarean section  2008    wh  . Cholecystectomy  2007  . Knee arthroscopy      x 3 - right knee  . Foot surgery  [id number]    x 2 right foot  . Hernia repair  1984  . Hammer toe surgery  2011  . Foot tendon transfer  2004  . Dilation and evacuation  05/05/2012    Procedure: DILATATION AND EVACUATION;  Surgeon: Alwyn Pea, MD;  Location: [facility];  Service: Gynecology;  Laterality: N/A;  . Dilation and curettage of uterus    . Cesarean section N/A 04/02/2013    Procedure: REPEAT CESAREAN SECTION;  Surgeon: Eldred Manges, MD;  Location: [facility];  Service: Obstetrics;  Laterality: N/A;    FAMILY HISTORY: Family History  Problem Relation Age of Onset  . Stroke Mother   . Kidney disease Mother     ESRD  . Cancer Mother     ovarian, pancreatic,stomach,uterine,thyroid  . Multiple sclerosis Mother   . Depression Mother   . Hypertension Father   . Diabetes Father     type II  . Sarcoidosis Father   . Asthma Daughter   . Hypertension Maternal Grandmother   . Diabetes Maternal Grandmother     . Diabetes Paternal Grandfather   . Cancer Other     multiple aunts w/ breast cancer    SOCIAL HISTORY:  Social History   Social History  . Marital Status: Married    Spouse Name: N/A  . Number of Children: N/A  . Years of Education: N/A   Occupational History  . Not on file.   Social History Main Topics  . Smoking status: Never Smoker   . Smokeless tobacco: Never Used  . Alcohol Use: 0.0 oz/week     Comment: occasional but none with pregnancy  . Drug Use: No  . Sexual Activity: Yes    Birth Control/ Protection: None     Comment: preganat   Other Topics Concern  . 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   Lives with boyfriend   Regular exercise: yes     PHYSICAL EXAM  Filed Vitals:   03/30/15 0852  BP: 129/81  Pulse: 69  Height: 5\' 9"  (1.753 m)  Weight: 274 lb 12.8 oz (124.648 kg)    Body mass index is 40.56 kg/(m^2).   General: The patient is well-developed and well-nourished and in no acute distress  Eyes:  Funduscopic exam difficult due to astigmatism.  However, the fundi appeared to be symmetric.   Neurologic Exam  Mental status: The patient is alert and oriented x 3 at the time of the examination. The patient has apparent normal recent and remote memory, with an apparently normal attention span and concentration ability.   Speech is normal.  Cranial nerves: Extraocular movements are full. Pupils show a 1+ left APD. Color vision is now symmetric. Visual acuity was 20/30 0D and 20/30 OS (was 20/50 last visit). VFFTC.     Facial symmetry is present. There is good facial sensation to soft touch bilaterally.Facial strength is normal.  Trapezius and sternocleidomastoid strength is normal. No dysarthria is noted.  The tongue is midline, and the patient has symmetric elevation of the soft palate. No obvious hearing deficits are noted.  Motor:  Muscle bulk is normal.   Tone is  mildly increased in right leg. Strength is  5 / 5 in all 4 extremities except 4+/5 right foot/ankle..   Sensory: Sensory testing is intact to  touch and vibration sensation in legs  Coordination: Cerebellar testing reveals good finger-nose-finger.  Gait and station: Station is normal.   Gait is good. Tandem gait is mildly wide.   Reflexes: Deep tendon reflexes are symmetric and normal bilaterally.       DIAGNOSTIC DATA (LABS, IMAGING, TESTING) - I reviewed patient records, labs, notes, testing and imaging myself where available.  Lab Results  Component Value Date   WBC 2.8* 09/27/2014   HGB 10.2* 04/03/2013   HCT 39.5 09/27/2014   MCV 75.7* 04/03/2013   PLT 161 04/03/2013      Component Value Date/Time   NA 141 09/27/2014 1000   NA 135 01/24/2012 2210   K 5.2 09/27/2014 1000   CL 101 09/27/2014 1000   CO2 24 09/27/2014 1000   GLUCOSE 80 09/27/2014 1000   GLUCOSE 84 01/24/2012 2210   BUN 10 09/27/2014 1000   BUN 9 01/24/2012 2210   CREATININE 0.74 09/27/2014 1000   CALCIUM 8.9 09/27/2014 1000   PROT 6.9 09/27/2014 1000   PROT 7.6 10/31/2009 1830   ALBUMIN 4.3 09/27/2014 1000   ALBUMIN 4.5 10/31/2009 1830   AST 14 09/27/2014 1000   ALT 10 09/27/2014 1000   ALKPHOS 43 09/27/2014 1000   BILITOT 0.4 09/27/2014 1000   BILITOT 0.4 10/31/2009 1830   GFRNONAA 108 09/27/2014 1000   GFRAA 124 09/27/2014 1000   No results found for: CHOL, HDL, LDLCALC, LDLDIRECT, TRIG, CHOLHDL Lab Results  Component Value Date   HGBA1C 5.7* 12/09/2009   Lab Results  Component Value Date   VITAMINB12 330 09/27/2009   Lab Results  Component Value Date   TSH 0.833 05/15/2010       ASSESSMENT AND PLAN  Multiple sclerosis (HCC)  Optic neuritis  Gait disturbance   1. The acute optic neuritis on the left is much better after IV Solu-Medrol.   2.   She will remian on Gilenya though we will need to consider another DMT if another  relpase of significant changes on MRI when we  recheck in the Spring 3.   She will continue to be active and exercises as tolerated  She will return in 4 - 5 months or sooner if she has new or worsening neurologic symptoms.   Myeshia Fojtik A. Felecia Shelling, MD, PhD [id number], [id number] AM Certified in Neurology, Clinical Neurophysiology, Sleep Medicine, Pain Medicine and Neuroimaging  [facility] [address], [place], [place] [address] [phone]

## 2015-08-30 ENCOUNTER — Ambulatory Visit (INDEPENDENT_AMBULATORY_CARE_PROVIDER_SITE_OTHER): Payer: BLUE CROSS/BLUE SHIELD | Admitting: Neurology

## 2015-08-30 ENCOUNTER — Encounter: Payer: Self-pay | Admitting: Neurology

## 2015-08-30 VITALS — BP 146/88 | HR 76 | Resp 18 | Ht 69.0 in | Wt 277.0 lb

## 2015-08-30 DIAGNOSIS — G35 Multiple sclerosis: Secondary | ICD-10-CM | POA: Diagnosis not present

## 2015-08-30 DIAGNOSIS — Z79899 Other long term (current) drug therapy: Secondary | ICD-10-CM | POA: Diagnosis not present

## 2015-08-30 DIAGNOSIS — R269 Unspecified abnormalities of gait and mobility: Secondary | ICD-10-CM

## 2015-08-30 DIAGNOSIS — E559 Vitamin D deficiency, unspecified: Secondary | ICD-10-CM | POA: Diagnosis not present

## 2015-08-30 DIAGNOSIS — M21371 Foot drop, right foot: Secondary | ICD-10-CM

## 2015-08-30 NOTE — Progress Notes (Signed)
[facility]  PATIENT: Jessica Roberson DOB: 1981/03/25  REFERRING DOCTOR OR PCP:  Debbrah Alar SOURCE: patient  _________________________________   HISTORICAL  CHIEF COMPLAINT:  Chief Complaint  Patient presents with  . Multiple Sclerosis    Sts. she continues to tolerate Gilenya well.  She is having more trouble with right foot drop--esp. towards the end of the day when she is more fatigued/fim    HISTORY OF PRESENT ILLNESS:  Jessica Roberson is a 35 yo woman with MS who had optic neuritis in December 2016.   She is reporting more trouble with her right leg.   Last week she tripped and hurt herself.     Gait/strength/sensation:  She feels her gait is doing worse and she is noticing that the right leg gets weaker as the day goes on. She gets a foot drop and has fallen. Recently she hurt herself with a fall..    She denies significant weakness. She denies fixed numbness. She notes some spasticity in her right leg that is sometimes worse at night.. Tizanidine helps. She takes 1 pill at night and will sometimes take half a pill once or twice a day if the spasticity is doing worse..    Vision:   She had optic neuritis in December 2016. She received IV steroids and vision returned to baseline. Currently, she does not note any asymmetry in visual acuity and has not noted color desaturation.   No diplopia  Bowel/bladder: She denies any significant change in bowel or bladder function. She denies any urinary frequency or hesitancy.   No recent urinary tract infections recently.   Fatigue/sleep: She does not note any significant problem with fatigue most days but does note some heat intolerance during the summer when she is outdoors.. She does not note any difficulty with her sleep.  Mood/cognition: She denies any depression or anxiety. She denies any significant issues with memory or other cognitive aspects.   Vit D:   She was vitamin D deficient in the past and  was on supplementation. However, she has not been taking vitamin D supplements for the past year. Thus the association between low vitamin D and exacerbations in MS.   MS History:   She was diagnosed with multiple sclerosis in 2006 after presenting with left facial numbness and vision changes. MRIs of the brain were consistent with MS. She also had a lumbar puncture in the spinal fluid was consistent with MS. She initially saw a Dr. at [facility] neurologic and then began to see Dr. [name] at [facility].   She was placed on Betaseron but switched to Copaxone due to injection site reactions. Unfortunately, she also had injection site reactions on Copaxone. Around 2010, she was started on Tysabri.  Due to a concern about PML, she wished to switch therapy. She was screened for a drug study but could not get the MRI's due to braces. She started Gilenya in 2012. Done well on that therapy she stopped twice, once for insurance reasons and once for pregnancy and resumed therapy after each pause. She has not had any definite exacerbations while on Gilenya. She tolerates it very well. She has not had any recent MRIs.   REVIEW OF SYSTEMS: Constitutional: No fevers, chills, sweats, or change in appetite.  Fatigue when hot.    Eyes: see above Ear, nose and throat: No hearing loss, ear pain, nasal congestion, sore throat Cardiovascular: No chest pain, palpitations Respiratory: No shortness of breath at rest or with exertion.  No wheezes GastrointestinaI: No nausea, vomiting, diarrhea, abdominal pain, fecal incontinence Genitourinary: No dysuria, urinary retention or frequency.  No nocturia. Musculoskeletal: No neck pain, back pain Integumentary: No rash, pruritus, skin lesions Neurological: as above Psychiatric: No depression at this time.  No anxiety Endocrine: No palpitations, diaphoresis, change in appetite, change in weigh or increased thirst Hematologic/Lymphatic: No anemia, purpura,  petechiae. Allergic/Immunologic: No itchy/runny eyes, nasal congestion, recent allergic reactions, rashes  ALLERGIES: Allergies  Allergen Reactions  . Aspirin Anaphylaxis  . Penicillins Anaphylaxis  . Cyclobenzaprine Itching  . Hydromorphone Hcl Itching  . Tramadol Hcl Hives    HOME MEDICATIONS:  Current outpatient prescriptions:  .  Fingolimod HCl 0.5 MG CAPS, Take 1 capsule (0.5 mg total) by mouth daily., Disp: 90 capsule, Rfl: 3 .  tiZANidine (ZANAFLEX) 4 MG tablet, Take 1 tablet (4 mg total) by mouth 3 (three) times daily as needed for muscle spasms., Disp: 90 tablet, Rfl: 11 .  [DISCONTINUED] omeprazole-sodium bicarbonate (ZEGERID) 40-1100 MG per capsule, Take 1 capsule by mouth daily., Disp: 30 capsule, Rfl: 5  PAST MEDICAL HISTORY: Past Medical History  Diagnosis Date  . Allergy   . Multiple sclerosis ([place])   . Anal fissure   . Obesity   . Ulcer 09/2009  . Colon polyp 2005  . Multiple sclerosis ([place])   . PCOS (polycystic ovarian syndrome)   . Pregnancy induced hypertension 03/2007    Resolved - [place] with 2008 pregnancy   . Depression     resolved - situational  . Migraine     history - otc med prn  . Neuromuscular disorder ([place])     Multiple sclerosis - no meds with 2014 pregnancy  . Arthritis     right knee  . Vision abnormalities     PAST SURGICAL HISTORY: Past Surgical History  Procedure Laterality Date  . Cesarean section  2008    wh  . Cholecystectomy  2007  . Knee arthroscopy      x 3 - right knee  . Foot surgery  [id number]    x 2 right foot  . Hernia repair  1984  . Hammer toe surgery  2011  . Foot tendon transfer  2004  . Dilation and evacuation  05/05/2012    Procedure: DILATATION AND EVACUATION;  Surgeon: Alwyn Pea, MD;  Location: [facility];  Service: Gynecology;  Laterality: N/A;  . Dilation and curettage of uterus    . Cesarean section N/A 04/02/2013    Procedure: REPEAT CESAREAN SECTION;  Surgeon: Eldred Manges, MD;  Location: [facility];  Service: Obstetrics;  Laterality: N/A;    FAMILY HISTORY: Family History  Problem Relation Age of Onset  . Stroke Mother   . Kidney disease Mother     ESRD  . Cancer Mother     ovarian, pancreatic,stomach,uterine,thyroid  . Multiple sclerosis Mother   . Depression Mother   . Hypertension Father   . Diabetes Father     type II  . Sarcoidosis Father   . Asthma Daughter   . Hypertension Maternal Grandmother   . Diabetes Maternal Grandmother   . Diabetes Paternal Grandfather   . Cancer Other     multiple aunts w/ breast cancer    SOCIAL HISTORY:  Social History   Social History  . Marital Status: Married    Spouse Name: N/A  . Number of Children: N/A  . Years of Education: N/A   Occupational History  . Not on file.   Social History  Main Topics  . Smoking status: Never Smoker   . Smokeless tobacco: Never Used  . Alcohol Use: 0.0 oz/week     Comment: occasional but none with pregnancy  . Drug Use: No  . Sexual Activity: Yes    Birth Control/ Protection: None     Comment: preganat   Other Topics Concern  . Not on file   Social History Narrative   Currently at [employer]; wants to enroll in RN program.   Lives with boyfriend   Regular exercise: yes     PHYSICAL EXAM  Filed Vitals:   08/30/15 0901  BP: 146/88  Pulse: 76  Resp: 18  Height: 5\' 9"  (1.753 m)  Weight: 277 lb (125.646 kg)    Body mass index is 40.89 kg/(m^2).   General: The patient is well-developed and well-nourished and in no acute distress  Eyes:  Funduscopic exam difficult due to astigmatism.  However, the fundi appeared to be symmetric.   Neurologic Exam  Mental status: The patient is alert and oriented x 3 at the time of the examination. The patient has apparent normal recent and remote memory, with an apparently normal attention span and concentration ability.   Speech is normal.  Cranial nerves: Extraocular movements are full. Pupils show a 1+ left  APD. Color vision is now symmetric. Visual acuity was 20/30 0D and 20/30 OS (was 20/50 last visit). VFFTC.     Facial symmetry is present. There is good facial sensation to soft touch bilaterally.Facial strength is normal.  Trapezius and sternocleidomastoid strength is normal. No dysarthria is noted.  The tongue is midline, and the patient has symmetric elevation of the soft palate. No obvious hearing deficits are noted.  Motor:  Muscle bulk is normal.   Tone is mildly increased in right leg. Strength is  5 / 5 in all 4 extremities except 4 to 4+/5 right foot/ankle..   Sensory: Sensory testing is intact to touch sensation in legs, but vibration is mildly reduced in right foot, compared to left.    Coordination: Cerebellar testing reveals good finger-nose-finger.  Gait and station: Station is normal.   Gait shows mild right foot drop. Tandem gait is mildly wide.   Reflexes: Deep tendon reflexes are symmetric and normal bilaterally.       DIAGNOSTIC DATA (LABS, IMAGING, TESTING) - I reviewed patient records, labs, notes, testing and imaging myself where available.  Lab Results  Component Value Date   WBC 2.8* 09/27/2014   HGB 10.2* 04/03/2013   HCT 39.5 09/27/2014   MCV 83 09/27/2014   PLT 225 09/27/2014      Component Value Date/Time   NA 141 09/27/2014 1000   NA 135 01/24/2012 2210   K 5.2 09/27/2014 1000   CL 101 09/27/2014 1000   CO2 24 09/27/2014 1000   GLUCOSE 80 09/27/2014 1000   GLUCOSE 84 01/24/2012 2210   BUN 10 09/27/2014 1000   BUN 9 01/24/2012 2210   CREATININE 0.74 09/27/2014 1000   CALCIUM 8.9 09/27/2014 1000   PROT 6.9 09/27/2014 1000   PROT 7.6 10/31/2009 1830   ALBUMIN 4.3 09/27/2014 1000   ALBUMIN 4.5 10/31/2009 1830   AST 14 09/27/2014 1000   ALT 10 09/27/2014 1000   ALKPHOS 43 09/27/2014 1000   BILITOT 0.4 09/27/2014 1000   BILITOT 0.4 10/31/2009 1830   GFRNONAA 108 09/27/2014 1000   GFRAA 124 09/27/2014 1000   No results found for: CHOL, HDL,  LDLCALC, LDLDIRECT, TRIG, CHOLHDL Lab  Results  Component Value Date   HGBA1C 5.7* 12/09/2009   Lab Results  Component Value Date   VITAMINB12 330 09/27/2009   Lab Results  Component Value Date   TSH 0.833 05/15/2010       ASSESSMENT AND PLAN  Multiple sclerosis ([place]) - Plan: CBC with Differential/Platelet, Hepatic function panel, VITAMIN D 25 Hydroxy (Vit-D Deficiency, Fractures)  Gait disturbance  High risk medication use - Plan: CBC with Differential/Platelet, Hepatic function panel, VITAMIN D 25 Hydroxy (Vit-D Deficiency, Fractures)  Vitamin D deficiency - Plan: VITAMIN D 25 Hydroxy (Vit-D Deficiency, Fractures)  Right foot drop     1.   Check CBC with differential and hepatic function test. She also has been vitamin D deficient in the past we will check that. 2.   She will remian on Gilenya though we will need to consider another DMT if another relpase occurs or of significant changes on MRI (Will recheck by the end of the year) 3.   AFO for the right foot to help with the foot drop  4.   She will continue to be active and exercises as tolerated  She will return in 4 - 5 months or sooner if she has new or worsening neurologic symptoms.   Richard A. Felecia Shelling, MD, PhD [id number], [id number] AM Certified in Neurology, Clinical Neurophysiology, Sleep Medicine, Pain Medicine and Neuroimaging  [facility] [address] [place], [place] [address] [phone]

## 2015-08-31 LAB — CBC WITH DIFFERENTIAL/PLATELET
Basophils Absolute: 0 10*3/uL (ref 0.0–0.2)
Basos: 0 %
EOS (ABSOLUTE): 0.2 10*3/uL (ref 0.0–0.4)
Eos: 9 %
Hematocrit: 38.2 % (ref 34.0–46.6)
Hemoglobin: 11.9 g/dL (ref 11.1–15.9)
IMMATURE GRANULOCYTES: 0 %
Immature Grans (Abs): 0 10*3/uL (ref 0.0–0.1)
Lymphocytes Absolute: 0.2 10*3/uL — ABNORMAL LOW (ref 0.7–3.1)
Lymphs: 6 %
MCH: 25 pg — ABNORMAL LOW (ref 26.6–33.0)
MCHC: 31.2 g/dL — ABNORMAL LOW (ref 31.5–35.7)
MCV: 80 fL (ref 79–97)
Monocytes Absolute: 0.3 10*3/uL (ref 0.1–0.9)
Monocytes: 10 %
NEUTROS PCT: 75 %
Neutrophils Absolute: 2.1 10*3/uL (ref 1.4–7.0)
PLATELETS: 243 10*3/uL (ref 150–379)
RBC: 4.76 x10E6/uL (ref 3.77–5.28)
RDW: 18.6 % — AB (ref 12.3–15.4)
WBC: 2.7 10*3/uL — AB (ref 3.4–10.8)

## 2015-08-31 LAB — HEPATIC FUNCTION PANEL
ALT: 8 IU/L (ref 0–32)
AST: 14 IU/L (ref 0–40)
Albumin: 4.4 g/dL (ref 3.5–5.5)
Alkaline Phosphatase: 46 IU/L (ref 39–117)
BILIRUBIN TOTAL: 0.5 mg/dL (ref 0.0–1.2)
Bilirubin, Direct: 0.14 mg/dL (ref 0.00–0.40)
Total Protein: 7 g/dL (ref 6.0–8.5)

## 2015-08-31 LAB — VITAMIN D 25 HYDROXY (VIT D DEFICIENCY, FRACTURES): Vit D, 25-Hydroxy: 15.4 ng/mL — ABNORMAL LOW (ref 30.0–100.0)

## 2015-09-02 ENCOUNTER — Telehealth: Payer: Self-pay | Admitting: *Deleted

## 2015-09-02 MED ORDER — VITAMIN D (ERGOCALCIFEROL) 1.25 MG (50000 UNIT) PO CAPS: 50000.0000 [IU] | ORAL_CAPSULE | ORAL | Status: DC

## 2015-09-02 NOTE — Telephone Encounter (Signed)
-----   Message from Britt Bottom, MD sent at 08/31/2015  7:52 PM EDT ----- Dema Severin blood cell count is lower than expected. Change the Gilenya to every other day.  Vitamin D is low.     50,000 IUs weekly 12 weeks then 5000 unit OTC daily

## 2015-09-02 NOTE — Telephone Encounter (Signed)
I have spoken with Jessica Roberson this morning and per RAS, advised that wbc's were lower than expected on Gilenya; she should decrease Gilenya to QOD.  Also, vit. d level was low; she should take rx. vit. d 50,000iu weekly for 12 weeks, then take otc vit. d 5,000iu daily.  She verbalized understanding of same.  [facility] [address] per her request/fim

## 2015-09-07 ENCOUNTER — Encounter: Payer: Self-pay | Admitting: *Deleted

## 2015-12-15 ENCOUNTER — Other Ambulatory Visit: Payer: Self-pay | Admitting: Neurology

## 2015-12-16 ENCOUNTER — Other Ambulatory Visit: Payer: Self-pay | Admitting: Neurology

## 2015-12-19 ENCOUNTER — Telehealth: Payer: Self-pay | Admitting: Neurology

## 2015-12-19 NOTE — Telephone Encounter (Signed)
Jessica Roberson has [employer] [place] and [place] Medicaid.  I have spoken with Rollene Fare at [facility] and given her this information/fim

## 2015-12-19 NOTE — Telephone Encounter (Signed)
[facility] [phone] called request insurance info on this pt. sts they rec'd the script but no ins information.

## 2015-12-27 ENCOUNTER — Other Ambulatory Visit: Payer: Self-pay | Admitting: Neurology

## 2015-12-28 ENCOUNTER — Telehealth: Payer: Self-pay | Admitting: Neurology

## 2015-12-28 ENCOUNTER — Other Ambulatory Visit: Payer: Self-pay | Admitting: Neurology

## 2015-12-28 MED ORDER — TIZANIDINE HCL 4 MG PO TABS: 4.0000 mg | ORAL_TABLET | Freq: Three times a day (TID) | ORAL | 11 refills | Status: DC | PRN

## 2015-12-28 NOTE — Telephone Encounter (Signed)
Pt request refill for tiZANidine (ZANAFLEX) 4 MG tablet sent to [facility]

## 2015-12-28 NOTE — Telephone Encounter (Signed)
Tizanidine escribed to [facility] as requested/im

## 2016-01-02 ENCOUNTER — Other Ambulatory Visit: Payer: Self-pay | Admitting: Neurology

## 2016-03-01 ENCOUNTER — Ambulatory Visit (INDEPENDENT_AMBULATORY_CARE_PROVIDER_SITE_OTHER): Payer: BC Managed Care – PPO | Admitting: Neurology

## 2016-03-01 ENCOUNTER — Encounter: Payer: Self-pay | Admitting: *Deleted

## 2016-03-01 ENCOUNTER — Encounter: Payer: Self-pay | Admitting: Neurology

## 2016-03-01 VITALS — BP 124/84 | HR 76 | Resp 16 | Ht 69.0 in | Wt 273.0 lb

## 2016-03-01 DIAGNOSIS — G35 Multiple sclerosis: Secondary | ICD-10-CM | POA: Diagnosis not present

## 2016-03-01 DIAGNOSIS — M21371 Foot drop, right foot: Secondary | ICD-10-CM | POA: Diagnosis not present

## 2016-03-01 DIAGNOSIS — R5383 Other fatigue: Secondary | ICD-10-CM | POA: Insufficient documentation

## 2016-03-01 DIAGNOSIS — Z79899 Other long term (current) drug therapy: Secondary | ICD-10-CM

## 2016-03-01 DIAGNOSIS — R269 Unspecified abnormalities of gait and mobility: Secondary | ICD-10-CM | POA: Diagnosis not present

## 2016-03-01 DIAGNOSIS — H469 Unspecified optic neuritis: Secondary | ICD-10-CM | POA: Diagnosis not present

## 2016-03-01 DIAGNOSIS — R200 Anesthesia of skin: Secondary | ICD-10-CM

## 2016-03-01 MED ORDER — CYCLOBENZAPRINE HCL 5 MG PO TABS: 5.0000 mg | ORAL_TABLET | Freq: Every day | ORAL | 3 refills | Status: DC

## 2016-03-01 NOTE — Progress Notes (Signed)
[facility]  PATIENT: Jessica Roberson DOB: [birth year]  REFERRING DOCTOR OR PCP:  Debbrah Alar SOURCE: patient  _________________________________   HISTORICAL  CHIEF COMPLAINT:  Chief Complaint  Patient presents with  . Multiple Sclerosis    Sts. she continues to tolerate Gilenya well and denies missed doses. Sts. she still has some difficulty with right foot--she did not get the right AFO but can't remember why.  Sts. now she is having constant pain in the toes on her right foot, and she finds herself walking on her tiptoes.  She also c/o increased fatigue over the last month, increased generalized pain in her whole upper body which is interfering with sleep, and twitching in her right eye, eyelid feels heay but is not drooping.  She has   . 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  She plans to take a year off of school and then go back for her doctorate.  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   She is on Gilenya and denies any definite exacerbation.   However, she has more pain and fatigue.  .   She is reporting more trouble with her right leg.   Last week she tripped and hurt herself.   Her kids are almost 3 and 9.    Pain:   She reports pain all over but worse in her toes, neck and arms right > left  Gait/strength/sensation:  She feels her gait is doing worse and she is noticing that the right leg gets weaker as the day goes on. She has a right foot drop and stumbles but no recent fall.  .    She denies significant weakness. She denies fixed numbness. She notes some spasticity in her right leg that is sometimes worse at night.. Tizanidine helps. She takes 1 pill at night  and will sometimes take half a pill once or twice a day if the spasticity is doing worse..    Vision:   She had optic neuritis in December 2016. She received IV steroids and vision returned to baseline. Currently, she does not note any asymmetry in visual acuity and has not noted color desaturation.   No diplopia.  Sometimes the eye twitches more.     Bowel/bladder: She denies any significant change in bowel or bladder function. She denies any urinary frequency or hesitancy.   No recent urinary tract infections recently.   Fatigue/sleep: She is more tired but notes she is very busy between job, school and family.  Sleep is worse, more trouble staying asleep due to pain.  .  Mood/cognition: She denies any depression but notes some anxiety and feels stressed.   She denies any significant issues with memory or other cognitive aspects.   Vit D:   She was vitamin D deficient in the past and was on supplementation. However, she has not been taking vitamin D supplements for the past year. Thus the association between low vitamin D and exacerbations in MS.   MS History:   She was diagnosed with multiple sclerosis in 2006 after presenting with left facial numbness and vision changes. MRIs of the brain were consistent with MS. She  also had a lumbar puncture in the spinal fluid was consistent with MS. She initially saw a Dr. at [facility] neurologic and then began to see Dr. [name] at [facility].   She was placed on Betaseron but switched to Copaxone due to injection site reactions. Unfortunately, she also had injection site reactions on Copaxone. Around 2010, she was started on Tysabri.  Due to a concern about PML, she wished to switch therapy. She was screened for a drug study but could not get the MRI's due to braces. She started Gilenya in 2012. Done well on that therapy she stopped twice, once for insurance reasons and once for pregnancy and resumed therapy after each pause. She has not had any definite  exacerbations while on Gilenya. She tolerates it very well. She has not had any recent MRIs.   REVIEW OF SYSTEMS: Constitutional: No fevers, chills, sweats, or change in appetite.  Fatigue when hot.    Eyes: see above Ear, nose and throat: No hearing loss, ear pain, nasal congestion, sore throat Cardiovascular: No chest pain, palpitations Respiratory: No shortness of breath at rest or with exertion.   No wheezes GastrointestinaI: No nausea, vomiting, diarrhea, abdominal pain, fecal incontinence Genitourinary: No dysuria, urinary retention or frequency.  No nocturia. Musculoskeletal: No neck pain, back pain Integumentary: No rash, pruritus, skin lesions Neurological: as above Psychiatric: No depression at this time.  No anxiety Endocrine: No palpitations, diaphoresis, change in appetite, change in weigh or increased thirst Hematologic/Lymphatic: No anemia, purpura, petechiae. Allergic/Immunologic: No itchy/runny eyes, nasal congestion, recent allergic reactions, rashes  ALLERGIES: Allergies  Allergen Reactions  . Aspirin Anaphylaxis  . Penicillins Anaphylaxis  . Cyclobenzaprine Itching  . Hydromorphone Hcl Itching  . Tramadol Hcl Hives    HOME MEDICATIONS:  Current Outpatient Prescriptions:  .  cholecalciferol (VITAMIN D) 1000 units tablet, Take 1,000 Units by mouth daily., Disp: , Rfl:  .  GILENYA 0.5 MG CAPS, TAKE 1 CAPSULE BY MOUTH EVERY DAY, Disp: 90 capsule, Rfl: 2 .  tiZANidine (ZANAFLEX) 4 MG tablet, Take 1 tablet (4 mg total) by mouth 3 (three) times daily as needed for muscle spasms., Disp: 90 tablet, Rfl: 11 .  cyclobenzaprine (FLEXERIL) 5 MG tablet, Take 1 tablet (5 mg total) by mouth at bedtime., Disp: 90 tablet, Rfl: 3  PAST MEDICAL HISTORY: Past Medical History:  Diagnosis Date  . Allergy   . Anal fissure   . Arthritis    right knee  . Colon polyp 2005  . Depression    resolved - situational  . Migraine    history - otc med prn  . Multiple sclerosis  ([place])   . Multiple sclerosis ([place])   . Neuromuscular disorder ([place])    Multiple sclerosis - no meds with 2014 pregnancy  . Obesity   . PCOS (polycystic ovarian syndrome)   . Pregnancy induced hypertension 03/2007   Resolved - [place] with 2008 pregnancy   . Ulcer ([place]) 09/2009  . Vision abnormalities     PAST SURGICAL HISTORY: Past Surgical History:  Procedure Laterality Date  . CESAREAN SECTION  2008   wh  . CESAREAN SECTION N/A 04/02/2013   Procedure: REPEAT CESAREAN SECTION;  Surgeon: Eldred Manges, MD;  Location: [facility];  Service: Obstetrics;  Laterality: N/A;  . CHOLECYSTECTOMY  2007  . DILATION AND CURETTAGE OF UTERUS    . DILATION AND EVACUATION  05/05/2012   Procedure: DILATATION AND EVACUATION;  Surgeon: Alwyn Pea, MD;  Location: [facility];  Service: Gynecology;  Laterality: N/A;  . FOOT SURGERY  [id number]   x 2 right foot  . FOOT TENDON TRANSFER  2004  . [facility]  2011  . HERNIA REPAIR  1984  . KNEE ARTHROSCOPY     x 3 - right knee    FAMILY HISTORY: Family History  Problem Relation Age of Onset  . Stroke Mother   . Kidney disease Mother     ESRD  . Cancer Mother     ovarian, pancreatic,stomach,uterine,thyroid  . Multiple sclerosis Mother   . Depression Mother   . Hypertension Father   . Diabetes Father     type II  . Sarcoidosis Father   . Asthma Daughter   . Hypertension Maternal Grandmother   . Diabetes Maternal Grandmother   . Diabetes Paternal Grandfather   . Cancer Other     multiple aunts w/ breast cancer    SOCIAL HISTORY:  Social History   Social History  . Marital status: Married    Spouse name: N/A  . Number of children: N/A  . Years of education: N/A   Occupational History  . Not on file.   Social History Main Topics  . Smoking status: Never Smoker  . Smokeless tobacco: Never Used  . Alcohol use 0.0 oz/week     Comment: occasional but none with pregnancy  . Drug use: No  . Sexual activity: Yes    Birth control/  protection: None     Comment: preganat   Other Topics Concern  . 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   Lives with boyfriend   Regular exercise: yes     PHYSICAL EXAM  Vitals:   03/01/16 0841  BP: 124/84  Pulse: 76  Resp: 16  Weight: 273 lb (123.8 kg)  Height: 5\' 9"  (1.753 m)    Body mass index is 40.32 kg/m.   General: The patient is well-developed and well-nourished and in no acute distress  Eyes:  Funduscopic exam difficult due to astigmatism.  However, the fundi appeared to be symmetric.   Neurologic Exam  Mental status: The patient is alert and oriented x 3 at the time of the examination. The patient has apparent normal recent and remote memory, with an apparently normal attention span and concentration ability.   Speech is normal.  Cranial nerves: Extraocular movements are full.   There is good facial sensation to soft touch bilaterally.Facial strength is normal.  Trapezius and sternocleidomastoid strength is normal. No dysarthria is noted.  The tongue is midline, and the patient has symmetric elevation of the soft palate. No obvious hearing deficits are noted.  Motor:  Muscle bulk is normal.   Tone is mildly increased in right leg. Strength is  5 / 5 in all 4 extremities except 4 to 4+/5 right foot/ankle..   Sensory: Sensory testing is intact to touch sensation in legs, but vibration is mildly reduced in right foot, compared to left.    Coordination: Cerebellar testing reveals good finger-nose-finger.  Gait and station: Station is normal.   Gait shows mild right foot drop. Tandem gait is mildly wide.   Reflexes: Deep tendon reflexes are symmetric and normal bilaterally.       DIAGNOSTIC DATA (LABS, IMAGING, TESTING) - I reviewed patient records, labs, notes, testing and imaging myself where available.  Lab Results  Component Value Date   WBC 2.7 (L) 08/30/2015   HGB 10.2  (  L) 04/03/2013   HCT 38.2 08/30/2015   MCV 80 08/30/2015   PLT 243 08/30/2015      Component Value Date/Time   NA 141 09/27/2014 1000   K 5.2 09/27/2014 1000   CL 101 09/27/2014 1000   CO2 24 09/27/2014 1000   GLUCOSE 80 09/27/2014 1000   GLUCOSE 84 01/24/2012 2210   BUN 10 09/27/2014 1000   CREATININE 0.74 09/27/2014 1000   CALCIUM 8.9 09/27/2014 1000   PROT 7.0 08/30/2015 0932   ALBUMIN 4.4 08/30/2015 0932   AST 14 08/30/2015 0932   ALT 8 08/30/2015 0932   ALKPHOS 46 08/30/2015 0932   BILITOT 0.5 08/30/2015 0932   GFRNONAA 108 09/27/2014 1000   GFRAA 124 09/27/2014 1000   No results found for: CHOL, HDL, LDLCALC, LDLDIRECT, TRIG, CHOLHDL Lab Results  Component Value Date   HGBA1C 5.7 (H) 12/09/2009   Lab Results  Component Value Date   VITAMINB12 330 09/27/2009   Lab Results  Component Value Date   TSH 0.833 05/15/2010       ASSESSMENT AND PLAN  Multiple sclerosis ([place]) - Plan: CBC with Differential/Platelet, Hepatic function panel, MR BRAIN W WO CONTRAST  Optic neuritis  Gait disturbance - Plan: MR BRAIN W WO CONTRAST  High risk medication use - Plan: CBC with Differential/Platelet, Hepatic function panel  Right foot drop  Numbness  Other fatigue    1.   Remain on Gilenya.   I will check an MRI of the brain to make sure that there has not been subclinical progression of her MS. If this is present, we will need to consider a more efficacious medication. Check CBC with differential and hepatic function test.  2.   She will remian on Gilenya though we will need to consider another DMT if another relpase occurs or of significant changes on MRI (Will recheck by the end of the year) 3.   Flexeril to help sleep.   If not better in 2-3 weeks try 800 mg gabapentin nightly 4.   She will continue to be active and exercises as tolerated  She will return in 6 months or sooner if she has new or worsening neurologic symptoms.   Tova Vater A. Felecia Shelling, MD, PhD  [id number], [id number] AM Certified in Neurology, Clinical Neurophysiology, Sleep Medicine, Pain Medicine and Neuroimaging  [facility] [address] [place], [place] [address] [phone]

## 2016-03-02 LAB — CBC WITH DIFFERENTIAL/PLATELET
BASOS: 0 %
Basophils Absolute: 0 10*3/uL (ref 0.0–0.2)
EOS (ABSOLUTE): 0.1 10*3/uL (ref 0.0–0.4)
Eos: 2 %
Hematocrit: 36.6 % (ref 34.0–46.6)
Hemoglobin: 11.7 g/dL (ref 11.1–15.9)
Immature Grans (Abs): 0 10*3/uL (ref 0.0–0.1)
Immature Granulocytes: 0 %
Lymphocytes Absolute: 0.2 10*3/uL — ABNORMAL LOW (ref 0.7–3.1)
Lymphs: 7 %
MCH: 24.5 pg — AB (ref 26.6–33.0)
MCHC: 32 g/dL (ref 31.5–35.7)
MCV: 77 fL — AB (ref 79–97)
MONOS ABS: 0.3 10*3/uL (ref 0.1–0.9)
Monocytes: 10 %
NEUTROS PCT: 81 %
Neutrophils Absolute: 2.5 10*3/uL (ref 1.4–7.0)
Platelets: 236 10*3/uL (ref 150–379)
RBC: 4.77 x10E6/uL (ref 3.77–5.28)
RDW: 18.4 % — AB (ref 12.3–15.4)
WBC: 3.1 10*3/uL — ABNORMAL LOW (ref 3.4–10.8)

## 2016-03-02 LAB — HEPATIC FUNCTION PANEL
ALT: 7 IU/L (ref 0–32)
AST: 13 IU/L (ref 0–40)
Albumin: 4.3 g/dL (ref 3.5–5.5)
Alkaline Phosphatase: 42 IU/L (ref 39–117)
BILIRUBIN TOTAL: 0.4 mg/dL (ref 0.0–1.2)
Bilirubin, Direct: 0.11 mg/dL (ref 0.00–0.40)
Total Protein: 6.9 g/dL (ref 6.0–8.5)

## 2016-03-05 ENCOUNTER — Telehealth: Payer: Self-pay | Admitting: *Deleted

## 2016-03-05 NOTE — Telephone Encounter (Signed)
-----   Message from Britt Bottom, MD sent at 03/02/2016 11:24 AM EST ----- Please let her know that lab work is okay. Lymphocytes are low as expected on Gilenya.

## 2016-03-05 NOTE — Telephone Encounter (Signed)
I have spoken with Maral this morning and per RAS, advised that labs are ok--lymphocytes are some low--to be expected on Gilenya.  She will continue QOD Gilenya.  She verbalized understanding of same/fim

## 2016-03-18 ENCOUNTER — Inpatient Hospital Stay: Admission: RE | Admit: 2016-03-18 | Payer: Medicaid Other | Source: Ambulatory Visit

## 2016-04-03 ENCOUNTER — Inpatient Hospital Stay: Admission: RE | Admit: 2016-04-03 | Payer: Medicaid Other | Source: Ambulatory Visit

## 2016-08-16 ENCOUNTER — Encounter: Payer: Medicaid Other | Attending: Internal Medicine | Admitting: Dietician

## 2016-08-16 ENCOUNTER — Encounter: Payer: Self-pay | Admitting: Dietician

## 2016-08-16 DIAGNOSIS — E559 Vitamin D deficiency, unspecified: Secondary | ICD-10-CM | POA: Diagnosis not present

## 2016-08-16 DIAGNOSIS — Z713 Dietary counseling and surveillance: Secondary | ICD-10-CM | POA: Diagnosis present

## 2016-08-16 DIAGNOSIS — E669 Obesity, unspecified: Secondary | ICD-10-CM | POA: Insufficient documentation

## 2016-08-16 NOTE — Progress Notes (Signed)
  Medical Nutrition Therapy:  Appt start time: 3007 end time:  1615.   Assessment:  Primary concerns today: Referred for obesity. Also has hx of vitamin D deficiency and low iron levels.  Her highest weight was 321 lbs after pregnancy with her son in 2014.  At doctor's visit this Jan 2018 she was 277 lbs.  On her own she has made changes to her diet and started exercising and currently weighs 263 lbs.  Her goal weight for the end of the summer is 250 lbs and long term goal is 180-190 lbs.  She cut out fried foods and alcohol and started drinking only water (cut out milk and juices and sodas).  She is also monitoring her sweet intake.  Her meat consumption had decreased a lot with limiting fried foods and she decided to cut out most meats altogether.  Having fish and eggs as her main animal proteins and has also tried soy products on the market.  She is tracking her food intake with MyFitness Pal which recommended 2250 calories per day for her.  She states she is eating on average 1500 calories per day.   She has been happy with her weight loss and wants to make sure she is eating in a balanced and healthy way while pursing her weight loss goals.  Outside of weight management recommendations we also discussed ways to increase intake of vitamin D and iron through dietary choices.  Preferred Learning Style:  No preference indicated   Learning Readiness:  Change in progress  MEDICATIONS: see list   DIETARY INTAKE:  Usual eating pattern includes 2-3 meals and 1-2 snacks per day.   Avoided foods include most meats.    24-hr recall:  B ( AM): 2 days a week will have low-carb oatmeal, other days skips and has hot green tea with 1 tsp honey  Snk ( AM): none  L ( PM): omelet or fritata with kale or spinach, mushrooms, peppers and onions, sweet potatoes, and cheese Snk ( PM): piece of fruit or chips D ( PM): pasta like spaghetti or spinach alfredo with veggie side like broccolli or asparagus and slice  of bread, or tacos with portabella mushrooms or tofu dish or salmon or flounder Snk ( PM): usually none, occasionally 2-3 jolly ranchers or homemade jello or mixed fruit cups or applesauce Beverages: 10 cups water per day, infused water with fruit, hot green tea 4-5x/week  Usual physical activity: water aerobics 2x/week for 45 min. and 2-3x/week gym workout with 30 min cardio plus some light weight training  Estimated energy needs: 1500-1700 calories  Progress Towards Goal(s):  In progress.   Nutritional Diagnosis:  NB-1.1 Food and nutrition-related knowledge deficit As related to no prior formal nutrition education.  As evidenced by patient request for dietary recommendations to promote weight loss.    Intervention:  Nutrition education and counseling.  Teaching Methods Utilized: Visual Auditory Hands on  Handouts given during visit include:  Vegetarian Protein Sources  Breakfast Ideas  Macronutrient Recommendations  Barriers to learning/adherence to lifestyle change: none  Demonstrated degree of understanding via:  Teach Back   Monitoring/Evaluation:  Dietary intake, exercise, and body weight prn.

## 2016-08-21 ENCOUNTER — Encounter: Payer: Self-pay | Admitting: Neurology

## 2016-08-21 ENCOUNTER — Ambulatory Visit (INDEPENDENT_AMBULATORY_CARE_PROVIDER_SITE_OTHER): Payer: BC Managed Care – PPO | Admitting: Neurology

## 2016-08-21 VITALS — BP 136/85 | HR 59 | Resp 16 | Ht 69.0 in | Wt 263.5 lb

## 2016-08-21 DIAGNOSIS — R5383 Other fatigue: Secondary | ICD-10-CM | POA: Diagnosis not present

## 2016-08-21 DIAGNOSIS — R269 Unspecified abnormalities of gait and mobility: Secondary | ICD-10-CM | POA: Diagnosis not present

## 2016-08-21 DIAGNOSIS — Z79899 Other long term (current) drug therapy: Secondary | ICD-10-CM

## 2016-08-21 DIAGNOSIS — M21371 Foot drop, right foot: Secondary | ICD-10-CM | POA: Diagnosis not present

## 2016-08-21 DIAGNOSIS — R55 Syncope and collapse: Secondary | ICD-10-CM

## 2016-08-21 DIAGNOSIS — G35 Multiple sclerosis: Secondary | ICD-10-CM

## 2016-08-21 MED ORDER — CYCLOBENZAPRINE HCL 5 MG PO TABS: 5.0000 mg | ORAL_TABLET | Freq: Every day | ORAL | 3 refills | Status: DC

## 2016-08-21 NOTE — Progress Notes (Signed)
[facility]  PATIENT: Jessica Roberson DOB: 16-Apr-1981  REFERRING DOCTOR OR PCP:  Debbrah Alar SOURCE: patient  _________________________________   HISTORICAL  CHIEF COMPLAINT:  Chief Complaint  Patient presents with  . Multiple Sclerosis    Sts. she continues to tolerate QOD Gilenya well.  New problem: sts. for the last 1-2 mos, she has several presyncopal episodes per wk. Unable to identify a trigger.  Doesn't think it is related to position.  Neg. EKG by pcp. Orthostatic VS done today/fim    HISTORY OF PRESENT ILLNESS:  Jessica Roberson is a 36 yo woman with MS .     MS:   She is on Gilenya and denies any definite exacerbation.  She tolerates it well.  Presyncope:   She gets episodes of pre-syncope that can occur in any position (even in bed), though they occur much more frequently while standing.    She gets lightheaded and felt she almost passed out once when she was unable to sit down --- she felt hot then cold and clammy with that episode and was better after finally sitting down.  .   Other times, she sits down and is better after a minute.    Gait/strength/sensation:  She has been more active and is going to the gym and walking more.   The right leg has a mild foot drop.    She denies fixed numbness. She notes some spasticity in her right leg that is sometimes worse at night.. Tizanidine helps. She takes 1 pill at night and will sometimes take half a pill once or twice a day if the spasticity is doing worse..    Vision:   Vision has returned to baseline.   She had optic neuritis in December 2016. She received IV steroids and vision returned to baseline. She does not note color desaturation.   No diplopia.       Bowel/bladder: She denies any significant change in bowel or bladder function. She denies any urinary frequency or hesitancy.   No recent urinary tract infections recently.   Fatigue/sleep: She is tired ome days but feels she can push  through it most days and is more active.    She feels better if she naps on days she is more tired.   Sleep is better  Mood/cognition: She denies any depression but notes mild anxiety.   She denies any significant issues with memory or other cognitive aspects.   Vit D:   She was vitamin D deficient in the past and was on supplementation. However, she has not been taking vitamin D supplements for the past year. Thus the association between low vitamin D and exacerbations in MS.   MS History:   She was diagnosed with multiple sclerosis in 2006 after presenting with left facial numbness and vision changes. MRIs of the brain were consistent with MS. She also had a lumbar puncture in the spinal fluid was consistent with MS. She initially saw a Dr. at [facility] neurologic and then began to see Dr. [name] at [facility].   She was placed on Betaseron but switched to Copaxone due to injection site reactions. Unfortunately, she also had injection site reactions on Copaxone. Around 2010, she was started on Tysabri.  Due to a concern about PML, she wished to switch therapy. She was screened for a drug study but could not get the MRI's due to braces. She started Gilenya in 2012. Done well on that therapy she stopped twice, once for insurance  reasons and once for pregnancy and resumed therapy after each pause. She has not had any definite exacerbations while on Gilenya. She tolerates it very well. She has not had any recent MRIs.   REVIEW OF SYSTEMS: Constitutional: No fevers, chills, sweats, or change in appetite.  Fatigue when hot.    Eyes: see above Ear, nose and throat: No hearing loss, ear pain, nasal congestion, sore throat Cardiovascular: No chest pain, palpitations Respiratory: No shortness of breath at rest or with exertion.   No wheezes GastrointestinaI: No nausea, vomiting, diarrhea, abdominal pain, fecal incontinence Genitourinary: No dysuria, urinary retention or frequency.  No  nocturia. Musculoskeletal: No neck pain, back pain Integumentary: No rash, pruritus, skin lesions Neurological: as above Psychiatric: No depression at this time.  No anxiety Endocrine: No palpitations, diaphoresis, change in appetite, change in weigh or increased thirst Hematologic/Lymphatic: No anemia, purpura, petechiae. Allergic/Immunologic: No itchy/runny eyes, nasal congestion, recent allergic reactions, rashes  ALLERGIES: Allergies  Allergen Reactions  . Aspirin Anaphylaxis  . Penicillins Anaphylaxis  . Hydromorphone Hcl Itching  . Tramadol Hcl Hives    HOME MEDICATIONS:  Current Outpatient Prescriptions:  Marland Kitchen  GILENYA 0.5 MG CAPS, TAKE 1 CAPSULE BY MOUTH EVERY DAY, Disp: 90 capsule, Rfl: 2 .  tiZANidine (ZANAFLEX) 4 MG tablet, Take 1 tablet (4 mg total) by mouth 3 (three) times daily as needed for muscle spasms., Disp: 90 tablet, Rfl: 11 .  cholecalciferol (VITAMIN D) 1000 units tablet, Take 1,000 Units by mouth daily., Disp: , Rfl:  .  cyclobenzaprine (FLEXERIL) 5 MG tablet, Take 1 tablet (5 mg total) by mouth at bedtime., Disp: 90 tablet, Rfl: 3  PAST MEDICAL HISTORY: Past Medical History:  Diagnosis Date  . Allergy   . Anal fissure   . Arthritis    right knee  . Colon polyp 2005  . Depression    resolved - situational  . Migraine    history - otc med prn  . Multiple sclerosis ([place])   . Multiple sclerosis ([place])   . Neuromuscular disorder ([place])    Multiple sclerosis - no meds with 2014 pregnancy  . Obesity   . PCOS (polycystic ovarian syndrome)   . Pregnancy induced hypertension 03/2007   Resolved - [place] with 2008 pregnancy   . Ulcer 09/2009  . Vision abnormalities     PAST SURGICAL HISTORY: Past Surgical History:  Procedure Laterality Date  . CESAREAN SECTION  2008   wh  . CESAREAN SECTION N/A 04/02/2013   Procedure: REPEAT CESAREAN SECTION;  Surgeon: Eldred Manges, MD;  Location: [facility];  Service: Obstetrics;  Laterality: N/A;  . CHOLECYSTECTOMY   2007  . DILATION AND CURETTAGE OF UTERUS    . DILATION AND EVACUATION  05/05/2012   Procedure: DILATATION AND EVACUATION;  Surgeon: Alwyn Pea, MD;  Location: [facility];  Service: Gynecology;  Laterality: N/A;  . FOOT SURGERY  [id number]   x 2 right foot  . FOOT TENDON TRANSFER  2004  . [place]  2011  . HERNIA REPAIR  1984  . KNEE ARTHROSCOPY     x 3 - right knee    FAMILY HISTORY: Family History  Problem Relation Age of Onset  . Stroke Mother   . Kidney disease Mother     ESRD  . Cancer Mother     ovarian, pancreatic,stomach,uterine,thyroid  . Multiple sclerosis Mother   . Depression Mother   . Hypertension Father   . Diabetes Father     type II  .  Sarcoidosis Father   . Asthma Daughter   . Hypertension Maternal Grandmother   . Diabetes Maternal Grandmother   . Diabetes Paternal Grandfather   . Cancer Other     multiple aunts w/ breast cancer    SOCIAL HISTORY:  Social History   Social History  . Marital status: Married    Spouse name: N/A  . Number of children: N/A  . Years of education: N/A   Occupational History  . Not on file.   Social History Main Topics  . Smoking status: Never Smoker  . Smokeless tobacco: Never Used  . Alcohol use 0.0 oz/week     Comment: occasional but none with pregnancy  . Drug use: No  . Sexual activity: Yes    Birth control/ protection: None     Comment: preganat   Other Topics Concern  . 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   Lives with boyfriend   Regular exercise: yes     PHYSICAL EXAM  Vitals:   08/21/16 0959  BP: 136/85  Pulse: (!) 59  Resp: 16  Weight: 263 lb 8 oz (119.5 kg)  Height: 5\' 9"  (1.753 m)   Orthostatic VS for the past 24 hrs:  BP- Lying Pulse- Lying BP- Sitting Pulse- Sitting BP- Standing at 0 minutes Pulse- Standing at 0 minutes  08/21/16 1000 136/85 59 131/87 64 (!) 144/94 74      Body mass  index is 38.91 kg/m.    General: The patient is well-developed and well-nourished and in no acute distress   Neurologic Exam  Mental status: The patient is alert and oriented x 3 at the time of the examination. The patient has apparent normal recent and remote memory, with an apparently normal attention span and concentration ability.   Speech is normal.  Cranial nerves: Extraocular movements are full.   Facial strength and sensation is normal. Trapezius and sternocleidomastoid strength is normal. No dysarthria is noted.  The tongue is midline, and the patient has symmetric elevation of the soft palate. No obvious hearing deficits are noted.  Motor:  Muscle bulk is normal.   Tone is mildly increased in right leg. Strength is  5 / 5 in all 4 extremities except 4 to 4+/5 right foot/ankle..   Sensory: Sensory testing is intact to touch and vibration sensation in the arms and legs.    Coordination: Cerebellar testing reveals good finger-nose-finger.  Gait and station: Station is normal.   Gait shows mild right foot drop. Tandem gait is mildly wide.   Reflexes: Deep tendon reflexes are symmetric and normal bilaterally.       DIAGNOSTIC DATA (LABS, IMAGING, TESTING) - I reviewed patient records, labs, notes, testing and imaging myself where available.  Lab Results  Component Value Date   WBC 3.1 (L) 03/01/2016   HGB 10.2 (L) 04/03/2013   HCT 36.6 03/01/2016   MCV 77 (L) 03/01/2016   PLT 236 03/01/2016      Component Value Date/Time   NA 141 09/27/2014 1000   K 5.2 09/27/2014 1000   CL 101 09/27/2014 1000   CO2 24 09/27/2014 1000   GLUCOSE 80 09/27/2014 1000   GLUCOSE 84 01/24/2012 2210   BUN 10 09/27/2014 1000   CREATININE 0.74 09/27/2014 1000   CALCIUM 8.9 09/27/2014 1000   PROT 6.9 03/01/2016 0923   ALBUMIN 4.3 03/01/2016 0923   AST 13 03/01/2016 0923  ALT 7 03/01/2016 0923   ALKPHOS 42 03/01/2016 0923   BILITOT 0.4 03/01/2016 0923   GFRNONAA 108 09/27/2014 1000    GFRAA 124 09/27/2014 1000   No results found for: CHOL, HDL, LDLCALC, LDLDIRECT, TRIG, CHOLHDL Lab Results  Component Value Date   HGBA1C 5.7 (H) 12/09/2009   Lab Results  Component Value Date   VITAMINB12 330 09/27/2009   Lab Results  Component Value Date   TSH 0.833 05/15/2010       ASSESSMENT AND PLAN  Multiple sclerosis ([place]) - Plan: MR BRAIN W WO CONTRAST, CBC with Differential/Platelet, Hepatic function panel, TSH, T4  High risk medication use - Plan: CBC with Differential/Platelet, Hepatic function panel, TSH, T4  Right foot drop  Pre-syncope  Other fatigue  Gait disturbance - Plan: MR BRAIN W WO CONTRAST    1.   Continu Gilenya.  Check CBC, LFT and thyroid tests.    I will check an MRI of the brain to make sure that there has not been subclinical progression of her MS. If this is present, we will need to consider a more efficacious medication.  2.   I'm not sure what is causing her presyncope. She does not appear to have postural hypotension. If this worsens, consider fludrocortisone.  3.   Flexeril qhs prn.    4.   She will continue to be active and exercises as tolerated  She will return in 6 months or sooner if she has new or worsening neurologic symptoms.   Darryl Willner A. Felecia Shelling, MD, PhD 05/27/2681, 41:96 AM Certified in Neurology, Clinical Neurophysiology, Sleep Medicine, Pain Medicine and Neuroimaging  [facility] [address] [place], [place] [address] [phone]

## 2016-08-22 LAB — CBC WITH DIFFERENTIAL/PLATELET
Basophils Absolute: 0 10*3/uL (ref 0.0–0.2)
Basos: 0 %
EOS (ABSOLUTE): 0.1 10*3/uL (ref 0.0–0.4)
EOS: 5 %
HEMOGLOBIN: 10 g/dL — AB (ref 11.1–15.9)
Hematocrit: 31.1 % — ABNORMAL LOW (ref 34.0–46.6)
IMMATURE GRANULOCYTES: 0 %
Immature Grans (Abs): 0 10*3/uL (ref 0.0–0.1)
LYMPHS: 7 %
Lymphocytes Absolute: 0.2 10*3/uL — ABNORMAL LOW (ref 0.7–3.1)
MCH: 24.2 pg — AB (ref 26.6–33.0)
MCHC: 32.2 g/dL (ref 31.5–35.7)
MCV: 75 fL — AB (ref 79–97)
Monocytes Absolute: 0.3 10*3/uL (ref 0.1–0.9)
Monocytes: 10 %
NEUTROS ABS: 1.9 10*3/uL (ref 1.4–7.0)
Neutrophils: 78 %
Platelets: 331 10*3/uL (ref 150–379)
RBC: 4.13 x10E6/uL (ref 3.77–5.28)
RDW: 17.1 % — AB (ref 12.3–15.4)
WBC: 2.4 10*3/uL — CL (ref 3.4–10.8)

## 2016-08-22 LAB — HEPATIC FUNCTION PANEL
ALT: 13 IU/L (ref 0–32)
AST: 14 IU/L (ref 0–40)
Albumin: 4.2 g/dL (ref 3.5–5.5)
Alkaline Phosphatase: 43 IU/L (ref 39–117)
BILIRUBIN TOTAL: 0.4 mg/dL (ref 0.0–1.2)
Bilirubin, Direct: 0.12 mg/dL (ref 0.00–0.40)
TOTAL PROTEIN: 7.2 g/dL (ref 6.0–8.5)

## 2016-08-22 LAB — TSH: TSH: 0.544 u[IU]/mL (ref 0.450–4.500)

## 2016-08-22 LAB — T4: T4, Total: 7.7 ug/dL (ref 4.5–12.0)

## 2016-08-23 ENCOUNTER — Telehealth: Payer: Self-pay | Admitting: *Deleted

## 2016-08-23 NOTE — Telephone Encounter (Signed)
-----   Message from Britt Bottom, MD sent at 08/22/2016  6:29 PM EDT ----- Continue the every other day Gilenya. I would like to recheck her CBC with differential and 6-8 weeks as the WBC were lower

## 2016-08-23 NOTE — Telephone Encounter (Signed)
I have spoken with Jessica Roberson this morning and per RAS, reviewed lab results as below.  She verbalized understanding of same.  I will give her a reminder call when it is time to repeat CBC/fim

## 2016-09-05 ENCOUNTER — Other Ambulatory Visit: Payer: Self-pay | Admitting: Nurse Practitioner

## 2016-09-05 DIAGNOSIS — N6310 Unspecified lump in the right breast, unspecified quadrant: Secondary | ICD-10-CM

## 2016-09-10 ENCOUNTER — Ambulatory Visit (HOSPITAL_COMMUNITY)
Admission: RE | Admit: 2016-09-10 | Discharge: 2016-09-10 | Disposition: A | Discharge status: Home / Self Care | Payer: BC Managed Care – PPO | Source: Ambulatory Visit | Attending: Surgery | Admitting: Surgery

## 2016-09-10 ENCOUNTER — Ambulatory Visit
Admission: RE | Admit: 2016-09-10 | Discharge: 2016-09-10 | Disposition: A | Discharge status: Home / Self Care | Payer: BC Managed Care – PPO | Source: Ambulatory Visit | Attending: Nurse Practitioner | Admitting: Nurse Practitioner

## 2016-09-10 ENCOUNTER — Ambulatory Visit
Admission: RE | Admit: 2016-09-10 | Discharge: 2016-09-10 | Disposition: A | Discharge status: Home / Self Care | Payer: Medicaid Other | Source: Ambulatory Visit | Attending: Nurse Practitioner | Admitting: Nurse Practitioner

## 2016-09-10 ENCOUNTER — Other Ambulatory Visit: Payer: Self-pay | Admitting: Nurse Practitioner

## 2016-09-10 ENCOUNTER — Other Ambulatory Visit (HOSPITAL_COMMUNITY): Payer: Self-pay | Admitting: Nurse Practitioner

## 2016-09-10 DIAGNOSIS — N6320 Unspecified lump in the left breast, unspecified quadrant: Secondary | ICD-10-CM

## 2016-09-10 DIAGNOSIS — R42 Dizziness and giddiness: Secondary | ICD-10-CM

## 2016-09-10 DIAGNOSIS — N6489 Other specified disorders of breast: Secondary | ICD-10-CM

## 2016-09-10 DIAGNOSIS — N6310 Unspecified lump in the right breast, unspecified quadrant: Secondary | ICD-10-CM

## 2016-09-10 LAB — VAS US CAROTID
LCCADDIAS: -31 cm/s
LEFT ECA DIAS: -21 cm/s
LEFT VERTEBRAL DIAS: -27 cm/s
LICADDIAS: -59 cm/s
LICAPDIAS: 40 cm/s
LICAPSYS: 137 cm/s
Left CCA dist sys: -122 cm/s
Left CCA prox dias: 31 cm/s
Left CCA prox sys: 163 cm/s
Left ICA dist sys: -145 cm/s
RIGHT CCA MID DIAS: -34 cm/s
RIGHT ECA DIAS: 17 cm/s
RIGHT VERTEBRAL DIAS: 29 cm/s
Right CCA prox dias: 17 cm/s
Right CCA prox sys: 115 cm/s
Right cca dist sys: -134 cm/s

## 2016-09-12 ENCOUNTER — Ambulatory Visit
Admission: RE | Admit: 2016-09-12 | Discharge: 2016-09-12 | Disposition: A | Discharge status: Home / Self Care | Payer: BC Managed Care – PPO | Source: Ambulatory Visit | Attending: Neurology | Admitting: Neurology

## 2016-09-12 DIAGNOSIS — G35 Multiple sclerosis: Secondary | ICD-10-CM

## 2016-09-12 DIAGNOSIS — R269 Unspecified abnormalities of gait and mobility: Secondary | ICD-10-CM

## 2016-09-12 MED ORDER — GADOBENATE DIMEGLUMINE 529 MG/ML IV SOLN
20.0000 mL | Freq: Once | INTRAVENOUS | Status: AC | PRN
  Administered 2016-09-12: 20 mL via INTRAVENOUS

## 2016-09-13 ENCOUNTER — Ambulatory Visit (INDEPENDENT_AMBULATORY_CARE_PROVIDER_SITE_OTHER): Payer: BC Managed Care – PPO | Admitting: Neurology

## 2016-09-13 ENCOUNTER — Telehealth: Payer: Self-pay | Admitting: *Deleted

## 2016-09-13 ENCOUNTER — Encounter: Payer: Self-pay | Admitting: Neurology

## 2016-09-13 VITALS — BP 130/84 | HR 62 | Resp 18 | Ht 69.0 in | Wt 263.0 lb

## 2016-09-13 DIAGNOSIS — R5383 Other fatigue: Secondary | ICD-10-CM

## 2016-09-13 DIAGNOSIS — R200 Anesthesia of skin: Secondary | ICD-10-CM

## 2016-09-13 DIAGNOSIS — Z79899 Other long term (current) drug therapy: Secondary | ICD-10-CM | POA: Diagnosis not present

## 2016-09-13 DIAGNOSIS — G35 Multiple sclerosis: Secondary | ICD-10-CM | POA: Diagnosis not present

## 2016-09-13 NOTE — Progress Notes (Signed)
[facility]  PATIENT: Jessica Roberson DOB: [birth year]  REFERRING DOCTOR OR PCP:  Debbrah Alar SOURCE: patient  _________________________________   HISTORICAL  CHIEF COMPLAINT:  Chief Complaint  Patient presents with  . Multiple Sclerosis    Has had one new lesion in the last 2 yrs., also recent optic neuritis.  Here today to discuss other tx. options/fim    HISTORY OF PRESENT ILLNESS:  Jessica Roberson is a 36 yo woman with MS .     MS:   She is on Gilenya and denies any clinical  Exacerbation this year.  She tolerates it well.   However, last year she had an episode of optic neuritis. Yesterday she had an MRI of the brain and I compare side-by-side the MRI from 2016. During the 2 year interim, she developed one moderate size left frontal periventricular focus. We discussed that with the combination of the changes on MRI and the right as bad she is not doing as well as we would like her to. We could continue the Gilenya because it does appear to have some efficacy for her. However, it is possible that she would do better on one of the medications. In detail, we discussed Lemtrada, ocrelizumab and Tysabri. She was once on Tysabri and tolerated it well. She stopped because of a concern of PML but that was before the JCV antibody test became available. She is going to give some more thought about the 3 options.   She denies any new symptoms. She continues to have some right leg weakness and numbness. Tizanidine helps the spasticity some.    MS History:   She was diagnosed with multiple sclerosis in 2006 after presenting with left facial numbness and vision changes. MRIs of the brain were consistent with MS. She also had a lumbar puncture in the spinal fluid was consistent with MS. She initially saw a Dr. at [facility] neurologic and then began to see Dr. [name] at [facility].   She was placed on Betaseron but switched to Copaxone due to injection site reactions.  Unfortunately, she also had injection site reactions on Copaxone. Around 2010, she was started on Tysabri.  Due to a concern about PML, she wished to switch therapy. She was screened for a drug study but could not get the MRI's due to braces. She started Gilenya in 2012. Done well on that therapy she stopped twice, once for insurance reasons and once for pregnancy and resumed therapy after each pause. She has not had any definite exacerbations while on Gilenya. She tolerates it very well. She had optic neuritis in 2017.   MRI 08/2016 showed a new focus not present in 2016.   REVIEW OF SYSTEMS: Constitutional: No fevers, chills, sweats, or change in appetite.  Fatigue when hot.    Eyes: see above Ear, nose and throat: No hearing loss, ear pain, nasal congestion, sore throat Cardiovascular: No chest pain, palpitations Respiratory: No shortness of breath at rest or with exertion.   No wheezes GastrointestinaI: No nausea, vomiting, diarrhea, abdominal pain, fecal incontinence Genitourinary: No dysuria, urinary retention or frequency.  No nocturia. Musculoskeletal: No neck pain, back pain Integumentary: No rash, pruritus, skin lesions Neurological: as above Psychiatric: No depression at this time.  No anxiety Endocrine: No palpitations, diaphoresis, change in appetite, change in weigh or increased thirst Hematologic/Lymphatic: No anemia, purpura, petechiae. Allergic/Immunologic: No itchy/runny eyes, nasal congestion, recent allergic reactions, rashes  ALLERGIES: Allergies  Allergen Reactions  . Aspirin Anaphylaxis  . Penicillins  Anaphylaxis  . Hydromorphone Hcl Itching  . Tramadol Hcl Hives    HOME MEDICATIONS:  Current Outpatient Prescriptions:  .  cholecalciferol (VITAMIN D) 1000 units tablet, Take 1,000 Units by mouth daily., Disp: , Rfl:  .  cyclobenzaprine (FLEXERIL) 5 MG tablet, Take 1 tablet (5 mg total) by mouth at bedtime., Disp: 90 tablet, Rfl: 3 .  GILENYA 0.5 MG CAPS, TAKE  1 CAPSULE BY MOUTH EVERY DAY, Disp: 90 capsule, Rfl: 2 .  tiZANidine (ZANAFLEX) 4 MG tablet, Take 1 tablet (4 mg total) by mouth 3 (three) times daily as needed for muscle spasms., Disp: 90 tablet, Rfl: 11  PAST MEDICAL HISTORY: Past Medical History:  Diagnosis Date  . Allergy   . Anal fissure   . Arthritis    right knee  . Colon polyp 2005  . Depression    resolved - situational  . Migraine    history - otc med prn  . Multiple sclerosis ([place])   . Multiple sclerosis ([place])   . Neuromuscular disorder ([place])    Multiple sclerosis - no meds with 2014 pregnancy  . Obesity   . PCOS (polycystic ovarian syndrome)   . Pregnancy induced hypertension 03/2007   Resolved - [place] with 2008 pregnancy   . Ulcer 09/2009  . Vision abnormalities     PAST SURGICAL HISTORY: Past Surgical History:  Procedure Laterality Date  . CESAREAN SECTION  2008   wh  . CESAREAN SECTION N/A 04/02/2013   Procedure: REPEAT CESAREAN SECTION;  Surgeon: Eldred Manges, MD;  Location: [facility];  Service: Obstetrics;  Laterality: N/A;  . CHOLECYSTECTOMY  2007  . DILATION AND CURETTAGE OF UTERUS    . DILATION AND EVACUATION  05/05/2012   Procedure: DILATATION AND EVACUATION;  Surgeon: Alwyn Pea, MD;  Location: [place];  Service: Gynecology;  Laterality: N/A;  . FOOT SURGERY  9030,[id number]   x 2 right foot  . FOOT TENDON TRANSFER  2004  . [place]  2011  . HERNIA REPAIR  1984  . KNEE ARTHROSCOPY     x 3 - right knee    FAMILY HISTORY: Family History  Problem Relation Age of Onset  . Stroke Mother   . Kidney disease Mother        ESRD  . Cancer Mother        ovarian, pancreatic,stomach,uterine,thyroid  . Multiple sclerosis Mother   . Depression Mother   . Breast cancer Mother 23  . Hypertension Father   . Diabetes Father        type II  . Sarcoidosis Father   . Asthma Daughter   . Hypertension Maternal Grandmother   . Diabetes Maternal Grandmother   . Diabetes Paternal Grandfather   .  Cancer Other        multiple aunts w/ breast cancer    SOCIAL HISTORY:  Social History   Social History  . Marital status: Married    Spouse name: N/A  . Number of children: N/A  . Years of education: N/A   Occupational History  . Not on file.   Social History Main Topics  . Smoking status: Never Smoker  . Smokeless tobacco: Never Used  . Alcohol use 0.0 oz/week     Comment: occasional but none with pregnancy  . Drug use: No  . Sexual activity: Yes    Birth control/ protection: None     Comment: preganat   Other Topics Concern  . 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   Lives with boyfriend   Regular exercise: yes     PHYSICAL EXAM  Vitals:   09/13/16 1136  BP: 130/84  Pulse: 62  Resp: 18  Weight: 263 lb (119.3 kg)  Height: 5\' 9"  (1.753 m)   No data found.     Body mass index is 38.84 kg/m.    General: The patient is well-developed and well-nourished and in no acute distress   Neurologic Exam  Mental status: The patient is alert and oriented x 3 at the time of the examination. The patient has apparent normal recent and remote memory, with an apparently normal attention span and concentration ability.   Speech is normal.  Cranial nerves: Extraocular movements are full.   Facial strength and sensation is normal. Trapezius and sternocleidomastoid strength is normal. No dysarthria is noted.  The tongue is midline, and the patient has symmetric elevation of the soft palate. No obvious hearing deficits are noted.  Motor:  Muscle bulk is normal.   Tone is mildly increased in right leg. Strength is  5 / 5 in all 4 extremities except 4 to 4+/5 right foot/ankle..   Sensory: Sensory testing is intact to touch and vibration sensation in the arms and legs.    Coordination: Cerebellar testing reveals good finger-nose-finger.  Gait and station: Station is normal.   Gait shows mild  right foot drop. Tandem gait is mildly wide.   Reflexes: Deep tendon reflexes are symmetric and normal bilaterally.       DIAGNOSTIC DATA (LABS, IMAGING, TESTING) - I reviewed patient records, labs, notes, testing and imaging myself where available.  Lab Results  Component Value Date   WBC 2.4 (LL) 08/21/2016   HGB 10.2 (L) 04/03/2013   HCT 31.1 (L) 08/21/2016   MCV 75 (L) 08/21/2016   PLT 331 08/21/2016      Component Value Date/Time   NA 141 09/27/2014 1000   K 5.2 09/27/2014 1000   CL 101 09/27/2014 1000   CO2 24 09/27/2014 1000   GLUCOSE 80 09/27/2014 1000   GLUCOSE 84 01/24/2012 2210   BUN 10 09/27/2014 1000   CREATININE 0.74 09/27/2014 1000   CALCIUM 8.9 09/27/2014 1000   PROT 7.2 08/21/2016 1044   ALBUMIN 4.2 08/21/2016 1044   AST 14 08/21/2016 1044   ALT 13 08/21/2016 1044   ALKPHOS 43 08/21/2016 1044   BILITOT 0.4 08/21/2016 1044   GFRNONAA 108 09/27/2014 1000   GFRAA 124 09/27/2014 1000   No results found for: CHOL, HDL, LDLCALC, LDLDIRECT, TRIG, CHOLHDL Lab Results  Component Value Date   HGBA1C 5.7 (H) 12/09/2009   Lab Results  Component Value Date   VITAMINB12 330 09/27/2009   Lab Results  Component Value Date   TSH 0.544 08/21/2016       ASSESSMENT AND PLAN  Multiple sclerosis ([place]) - Plan: Quantiferon tb gold assay (blood), Hepatitis B core antibody, total, Hepatitis B surface antigen, Hepatitis B surface antibody, Stratify JCV Antibody Test ([facility]), TSH, CBC with Differential/Platelet, Comprehensive metabolic panel  High risk medication use - Plan: Quantiferon tb gold assay (blood), Hepatitis B core antibody, total, Hepatitis B surface antigen, Hepatitis B surface antibody, Stratify JCV Antibody Test ([facility]), TSH, CBC with Differential/Platelet, Comprehensive metabolic panel  Numbness  Other fatigue    1.   Continu Gilenya for now.   However, we discussed that with the combination of the changes on MRI  and the right as bad she is not  doing as well as we would like her to. We could continue the Gilenya because it does appear to have some efficacy for her. However, it is possible that she would do better on one of the medications. In detail, we discussed Lemtrada, ocrelizumab and Tysabri. She was once on Tysabri and tolerated it well. She stopped because of a concern of PML but that was before the JCV antibody test became available. She is going to give some more thought about the 3 options.    I will check labwork including the hepatitis labs, TB test, TSH and JCV antibody. I'll call her with the results next week and she will decide where that she would prefer to stay on Gilenya or switch to one of the other medications. She'll pick the medication based on the results of the lab work. 2.   Continue other medications.  30 minutes face-to-face evaluation with greater than one time counseling and coordinating care about her MS and disease modifying therapies .   Tysha Grismore A. Felecia Shelling, MD, PhD 8/88/7579, 7:28 PM Certified in Neurology, Clinical Neurophysiology, Sleep Medicine, Pain Medicine and Neuroimaging  [facility] [address], [place], [place] [address] [phone]

## 2016-09-13 NOTE — Telephone Encounter (Signed)
-----   Message from Britt Bottom, MD sent at 09/12/2016  6:22 PM EDT ----- Please let Jessica Roberson know that the MRI of the brain does not show any brand-new MS lesions. However, when compared to the MRI from 2016 there was 1 lesion that was not present on the older MRI meaning it occurred in last 2 years.     As she does have a new lesion and she also had optic neuritis in the last 2 years, she may not be doing as well as we would like her to be doing on the [place]. If she wishes to consider changing to a different medicine, we can bring her in sometime in the next week.

## 2016-09-13 NOTE — Telephone Encounter (Signed)
I have spoken with Vikki this morning, and per RAS, reviewed below MRI report.  She verbalized understanding of same.  Due to work travel and the [employer] Day holiday next week, we are not able to find a day she can come in.  She will come in this morning at 1130/fim

## 2016-09-14 LAB — CBC WITH DIFFERENTIAL/PLATELET
BASOS ABS: 0 10*3/uL (ref 0.0–0.2)
Basos: 0 %
EOS (ABSOLUTE): 0.1 10*3/uL (ref 0.0–0.4)
Eos: 3 %
Hematocrit: 33.6 % — ABNORMAL LOW (ref 34.0–46.6)
Hemoglobin: 10.2 g/dL — ABNORMAL LOW (ref 11.1–15.9)
IMMATURE GRANS (ABS): 0 10*3/uL (ref 0.0–0.1)
Immature Granulocytes: 0 %
LYMPHS: 9 %
Lymphocytes Absolute: 0.2 10*3/uL — ABNORMAL LOW (ref 0.7–3.1)
MCH: 23.1 pg — ABNORMAL LOW (ref 26.6–33.0)
MCHC: 30.4 g/dL — AB (ref 31.5–35.7)
MCV: 76 fL — ABNORMAL LOW (ref 79–97)
MONOCYTES: 8 %
Monocytes Absolute: 0.2 10*3/uL (ref 0.1–0.9)
NEUTROS ABS: 2 10*3/uL (ref 1.4–7.0)
Neutrophils: 80 %
PLATELETS: 301 10*3/uL (ref 150–379)
RBC: 4.42 x10E6/uL (ref 3.77–5.28)
RDW: 17.2 % — ABNORMAL HIGH (ref 12.3–15.4)
WBC: 2.5 10*3/uL — AB (ref 3.4–10.8)

## 2016-09-14 LAB — COMPREHENSIVE METABOLIC PANEL
ALK PHOS: 40 IU/L (ref 39–117)
ALT: 10 IU/L (ref 0–32)
AST: 12 IU/L (ref 0–40)
Albumin/Globulin Ratio: 1.5 (ref 1.2–2.2)
Albumin: 4.3 g/dL (ref 3.5–5.5)
BILIRUBIN TOTAL: 0.5 mg/dL (ref 0.0–1.2)
BUN/Creatinine Ratio: 9 (ref 9–23)
BUN: 6 mg/dL (ref 6–20)
CHLORIDE: 104 mmol/L (ref 96–106)
CO2: 24 mmol/L (ref 18–29)
CREATININE: 0.66 mg/dL (ref 0.57–1.00)
Calcium: 9.4 mg/dL (ref 8.7–10.2)
GFR calc Af Amer: 133 mL/min/{1.73_m2} (ref >59)
GFR calc non Af Amer: 116 mL/min/{1.73_m2} (ref >59)
GLUCOSE: 80 mg/dL (ref 65–99)
Globulin, Total: 2.9 g/dL (ref 1.5–4.5)
Potassium: 4.6 mmol/L (ref 3.5–5.2)
Sodium: 140 mmol/L (ref 134–144)
TOTAL PROTEIN: 7.2 g/dL (ref 6.0–8.5)

## 2016-09-14 LAB — HEPATITIS B SURFACE ANTIBODY,QUALITATIVE: HEP B SURFACE AB, QUAL: NONREACTIVE

## 2016-09-14 LAB — HEPATITIS B SURFACE ANTIGEN: Hepatitis B Surface Ag: NEGATIVE

## 2016-09-14 LAB — HEPATITIS B CORE ANTIBODY, TOTAL: Hep B Core Total Ab: NEGATIVE

## 2016-09-14 LAB — TSH: TSH: 0.448 u[IU]/mL — AB (ref 0.450–4.500)

## 2016-09-18 LAB — QUANTIFERON IN TUBE
QUANTIFERON MITOGEN VALUE: 1.12 IU/mL
QUANTIFERON TB AG VALUE: 0.04 [IU]/mL
QUANTIFERON TB GOLD: NEGATIVE
Quantiferon Nil Value: 0.09 IU/mL

## 2016-09-18 LAB — QUANTIFERON TB GOLD ASSAY (BLOOD)

## 2016-09-19 ENCOUNTER — Encounter: Payer: Self-pay | Admitting: *Deleted

## 2016-09-28 ENCOUNTER — Encounter: Payer: Self-pay | Admitting: *Deleted

## 2016-10-08 ENCOUNTER — Telehealth: Payer: Self-pay | Admitting: *Deleted

## 2016-10-08 ENCOUNTER — Other Ambulatory Visit: Payer: Self-pay | Admitting: *Deleted

## 2016-10-08 DIAGNOSIS — G35 Multiple sclerosis: Secondary | ICD-10-CM

## 2016-10-08 DIAGNOSIS — Z79899 Other long term (current) drug therapy: Secondary | ICD-10-CM

## 2016-10-08 NOTE — Progress Notes (Signed)
Recheck [email or web address]

## 2016-10-08 NOTE — Telephone Encounter (Signed)
I have spoken with Margan this morning and reminded her it is time to repeat CBC this wk.  She verbalized understanding of same.  Orders in EPIC/fim

## 2016-10-08 NOTE — Telephone Encounter (Signed)
-----   Message from Britt Bottom, MD sent at 08/22/2016  6:29 PM EDT ----- Continue the every other day Gilenya. I would like to recheck her CBC with differential and 6-8 weeks as the WBC were lower

## 2016-10-15 ENCOUNTER — Other Ambulatory Visit (INDEPENDENT_AMBULATORY_CARE_PROVIDER_SITE_OTHER): Payer: Self-pay

## 2016-10-15 DIAGNOSIS — G35 Multiple sclerosis: Secondary | ICD-10-CM

## 2016-10-15 DIAGNOSIS — Z0289 Encounter for other administrative examinations: Secondary | ICD-10-CM

## 2016-10-15 DIAGNOSIS — Z79899 Other long term (current) drug therapy: Secondary | ICD-10-CM

## 2016-10-16 LAB — CBC WITH DIFFERENTIAL/PLATELET
BASOS ABS: 0 10*3/uL (ref 0.0–0.2)
BASOS: 0 %
EOS (ABSOLUTE): 0.1 10*3/uL (ref 0.0–0.4)
Eos: 3 %
Hematocrit: 36.9 % (ref 34.0–46.6)
Hemoglobin: 11.3 g/dL (ref 11.1–15.9)
IMMATURE GRANULOCYTES: 0 %
Immature Grans (Abs): 0 10*3/uL (ref 0.0–0.1)
LYMPHS: 19 %
Lymphocytes Absolute: 0.4 10*3/uL — ABNORMAL LOW (ref 0.7–3.1)
MCH: 24 pg — AB (ref 26.6–33.0)
MCHC: 30.6 g/dL — ABNORMAL LOW (ref 31.5–35.7)
MCV: 79 fL (ref 79–97)
MONOS ABS: 0.1 10*3/uL (ref 0.1–0.9)
Monocytes: 6 %
NEUTROS PCT: 72 %
Neutrophils Absolute: 1.6 10*3/uL (ref 1.4–7.0)
PLATELETS: 277 10*3/uL (ref 150–379)
RBC: 4.7 x10E6/uL (ref 3.77–5.28)
RDW: 19.6 % — ABNORMAL HIGH (ref 12.3–15.4)
WBC: 2.3 10*3/uL — AB (ref 3.4–10.8)

## 2016-10-22 ENCOUNTER — Other Ambulatory Visit: Payer: Self-pay | Admitting: *Deleted

## 2016-10-22 ENCOUNTER — Telehealth: Payer: Self-pay | Admitting: *Deleted

## 2016-10-22 NOTE — Telephone Encounter (Signed)
-----   Message from Britt Bottom, MD sent at 10/22/2016  4:18 PM EDT ----- Please let her know  that the blood counts are still low.    I think we should consider Tysabri as it does not reduce the WBC count. We would need to make sure that she is JCV antibody negative and we can check that later this week.

## 2016-10-22 NOTE — Telephone Encounter (Signed)
Pt called back, please call her back

## 2016-10-22 NOTE — Telephone Encounter (Signed)
LMTC./fim 

## 2016-10-23 NOTE — Telephone Encounter (Signed)
I have spoken with Jessica Roberson this morning and sched. appt. with RAS 10/29/16 at 1330 to discuss Tysabri/fim

## 2016-10-25 ENCOUNTER — Telehealth: Payer: Self-pay | Admitting: *Deleted

## 2016-10-25 ENCOUNTER — Telehealth: Payer: Self-pay | Admitting: Neurology

## 2016-10-25 NOTE — Telephone Encounter (Signed)
I have spoken with Jessica Roberson this afternoon and given another appt. to discuss Tysabri/fim

## 2016-10-25 NOTE — Telephone Encounter (Signed)
Patient called office had to cancel appointment with Dr. Felecia Shelling on 10/29/16 due to work.  I was going to reschedule patient, but wasn't sure where I could put patient and how soon she was needing to be seen.  Please call

## 2016-10-25 NOTE — Telephone Encounter (Signed)
-----   Message from Britt Bottom, MD sent at 10/22/2016  4:18 PM EDT ----- Please let her know  that the blood counts are still low.    I think we should consider Tysabri as it does not reduce the WBC count. We would need to make sure that she is JCV antibody negative and we can check that later this week.

## 2016-10-25 NOTE — Telephone Encounter (Signed)
I spoke with Jessica Roberson last week.  She  has an appt. with RAS on 10/29/16 to discuss Tysabri/fim

## 2016-10-29 ENCOUNTER — Ambulatory Visit: Payer: Self-pay | Admitting: Neurology

## 2016-11-05 ENCOUNTER — Ambulatory Visit (INDEPENDENT_AMBULATORY_CARE_PROVIDER_SITE_OTHER): Payer: BC Managed Care – PPO | Admitting: Neurology

## 2016-11-05 ENCOUNTER — Encounter: Payer: Self-pay | Admitting: Neurology

## 2016-11-05 ENCOUNTER — Telehealth: Payer: Self-pay | Admitting: *Deleted

## 2016-11-05 VITALS — BP 108/78 | HR 82 | Resp 18 | Ht 69.0 in | Wt 255.0 lb

## 2016-11-05 DIAGNOSIS — R269 Unspecified abnormalities of gait and mobility: Secondary | ICD-10-CM

## 2016-11-05 DIAGNOSIS — M21371 Foot drop, right foot: Secondary | ICD-10-CM

## 2016-11-05 DIAGNOSIS — Z79899 Other long term (current) drug therapy: Secondary | ICD-10-CM

## 2016-11-05 DIAGNOSIS — G35 Multiple sclerosis: Secondary | ICD-10-CM

## 2016-11-05 NOTE — Progress Notes (Signed)
[facility]  PATIENT: Jessica Roberson DOB: 11-Aug-1980  REFERRING DOCTOR OR PCP:  Debbrah Alar SOURCE: patient  _________________________________   HISTORICAL  CHIEF COMPLAINT:  Chief Complaint  Patient presents with  . Multiple Sclerosis    Here to discuss Gilenya, low wbc's, and possibility of switching to Tysabri/fim    HISTORY OF PRESENT ILLNESS:  Jessica Roberson is a 36 yo woman with MS .     MS:  She has been on Gilenya off and on for these 6 or 7 years. She stopped during pregnancy she has tolerated Gilenya well but has had abnormal lab values with low white blood cell and low lymphocyte count, even after changing to every other day. Although she has not had definite clinical exacerbations recently, the last MRI performed in a couple months ago showed a moderate size left frontal periventricular focus that develop during the interim   we had discussed that it appears as though the Gilenya is not optimal between the low white blood cell count and the changes on MRI. Options would include continuing on Gilenya or switching to one of the higher efficacy medications.   We have discussed Lemtrada, ocrelizumab and Tysabri. She was once on Tysabri and tolerated it well. When she stopped she was concerned about PML but she is JCV antibody negative.  Additionally, Holland Falling is of interest to her due to the dosing regimen.   She denies any new symptoms. She continues to have some right leg weakness and numbness. Tizanidine helps the spasticity some.    MS History:   She was diagnosed with multiple sclerosis in 2006 after presenting with left facial numbness and vision changes. MRIs of the brain were consistent with MS. She also had a lumbar puncture in the spinal fluid was consistent with MS. She initially saw a Dr. at [facility] neurologic and then began to see Dr. [name] at [facility].   She was placed on Betaseron but switched to Copaxone due to injection site  reactions. Unfortunately, she also had injection site reactions on Copaxone. Around 2010, she was started on Tysabri.  Due to a concern about PML, she wished to switch therapy. She was screened for a drug study but could not get the MRI's due to braces. She started Gilenya in 2012. Done well on that therapy she stopped twice, once for insurance reasons and once for pregnancy and resumed therapy after each pause. She has not had any definite exacerbations while on Gilenya. She tolerates it very well. She had optic neuritis in 2017.   MRI 08/2016 showed a new focus not present in 2016.   REVIEW OF SYSTEMS: Constitutional: No fevers, chills, sweats, or change in appetite.  Fatigue when hot.    Eyes: see above Ear, nose and throat: No hearing loss, ear pain, nasal congestion, sore throat Cardiovascular: No chest pain, palpitations Respiratory: No shortness of breath at rest or with exertion.   No wheezes GastrointestinaI: No nausea, vomiting, diarrhea, abdominal pain, fecal incontinence Genitourinary: No dysuria, urinary retention or frequency.  No nocturia. Musculoskeletal: No neck pain, back pain Integumentary: No rash, pruritus, skin lesions Neurological: as above Psychiatric: No depression at this time.  No anxiety Endocrine: No palpitations, diaphoresis, change in appetite, change in weigh or increased thirst Hematologic/Lymphatic: No anemia, purpura, petechiae. Allergic/Immunologic: No itchy/runny eyes, nasal congestion, recent allergic reactions, rashes  ALLERGIES: Allergies  Allergen Reactions  . Aspirin Anaphylaxis  . Penicillins Anaphylaxis  . Cyclobenzaprine Itching  . Hydromorphone Hcl Itching  .  Tramadol Hcl Hives    HOME MEDICATIONS:  Current Outpatient Prescriptions:  .  Alemtuzumab (LEMTRADA) 12 MG/1.2ML SOLN, Inject into the vein., Disp: , Rfl:  .  cholecalciferol (VITAMIN D) 1000 units tablet, Take 1,000 Units by mouth daily., Disp: , Rfl:  .  cyclobenzaprine  (FLEXERIL) 5 MG tablet, Take 1 tablet (5 mg total) by mouth at bedtime., Disp: 90 tablet, Rfl: 3 .  GILENYA 0.5 MG CAPS, TAKE 1 CAPSULE BY MOUTH EVERY DAY, Disp: 90 capsule, Rfl: 2 .  tiZANidine (ZANAFLEX) 4 MG tablet, Take 1 tablet (4 mg total) by mouth 3 (three) times daily as needed for muscle spasms., Disp: 90 tablet, Rfl: 11  PAST MEDICAL HISTORY: Past Medical History:  Diagnosis Date  . Allergy   . Anal fissure   . Arthritis    right knee  . Colon polyp 2005  . Depression    resolved - situational  . Migraine    history - otc med prn  . Multiple sclerosis ([place])   . Multiple sclerosis ([place])   . Neuromuscular disorder ([place])    Multiple sclerosis - no meds with 2014 pregnancy  . Obesity   . PCOS (polycystic ovarian syndrome)   . Pregnancy induced hypertension 03/2007   Resolved - [place] with 2008 pregnancy   . Ulcer 09/2009  . Vision abnormalities     PAST SURGICAL HISTORY: Past Surgical History:  Procedure Laterality Date  . CESAREAN SECTION  2008   wh  . CESAREAN SECTION N/A 04/02/2013   Procedure: REPEAT CESAREAN SECTION;  Surgeon: Eldred Manges, MD;  Location: [facility];  Service: Obstetrics;  Laterality: N/A;  . CHOLECYSTECTOMY  2007  . DILATION AND CURETTAGE OF UTERUS    . DILATION AND EVACUATION  05/05/2012   Procedure: DILATATION AND EVACUATION;  Surgeon: Alwyn Pea, MD;  Location: [facility];  Service: Gynecology;  Laterality: N/A;  . FOOT SURGERY  [id number]   x 2 right foot  . FOOT TENDON TRANSFER  2004  . [place]  2011  . HERNIA REPAIR  1984  . KNEE ARTHROSCOPY     x 3 - right knee    FAMILY HISTORY: Family History  Problem Relation Age of Onset  . Stroke Mother   . Kidney disease Mother        ESRD  . Cancer Mother        ovarian, pancreatic,stomach,uterine,thyroid  . Multiple sclerosis Mother   . Depression Mother   . Breast cancer Mother 77  . Hypertension Father   . Diabetes Father        type II  . Sarcoidosis Father   . Asthma  Daughter   . Hypertension Maternal Grandmother   . Diabetes Maternal Grandmother   . Diabetes Paternal Grandfather   . Cancer Other        multiple aunts w/ breast cancer    SOCIAL HISTORY:  Social History   Social History  . Marital status: Married    Spouse name: N/A  . Number of children: N/A  . Years of education: N/A   Occupational History  . Not on file.   Social History Main Topics  . Smoking status: Never Smoker  . Smokeless tobacco: Never Used  . Alcohol use 0.0 oz/week     Comment: occasional but none with pregnancy  . Drug use: No  . Sexual activity: Yes    Birth control/ protection: None     Comment: preganat   Other Topics Concern  .  Not on file   Social History Narrative   Currently at [employer]; wants to enroll in RN program.   Lives with boyfriend   Regular exercise: yes     PHYSICAL EXAM  Vitals:   11/05/16 1353  BP: 108/78  Pulse: 82  Resp: 18  Weight: 255 lb (115.7 kg)  Height: 5\' 9"  (1.753 m)   No data found.     Body mass index is 37.66 kg/m.    General: The patient is well-developed and well-nourished and in no acute distress   Neurologic Exam  Mental status: The patient is alert and oriented x 3 at the time of the examination. The patient has apparent normal recent and remote memory, with an apparently normal attention span and concentration ability.   Speech is normal.  Cranial nerves:   Extraocular muscles are normal. Facial strength and sensation is normal. Palatal elevation and tongue protrusion is midline. Trapezius strength is normal.    No obvious hearing deficits are noted.  Motor:  Muscle bulk is normal.   Tone is mildly increased in right leg. Strength is  5 / 5 in all 4 extremities except 4 to 4+/5 right foot/ankle..   Sensory: Sensory testing is intact to touch and vibration sensation in the arms and legs.    Coordination: Cerebellar testing reveals good finger-nose-finger.  Gait and  station: Station is normal.   She has a slight right foot drop and her tandem gait is mildly wide.   Reflexes: Deep tendon reflexes are symmetric and normal bilaterally.       DIAGNOSTIC DATA (LABS, IMAGING, TESTING) - I reviewed patient records, labs, notes, testing and imaging myself where available.  Lab Results  Component Value Date   WBC 2.3 (LL) 10/15/2016   HGB 11.3 10/15/2016   HCT 36.9 10/15/2016   MCV 79 10/15/2016   PLT 277 10/15/2016      Component Value Date/Time   NA 140 09/13/2016 1241   K 4.6 09/13/2016 1241   CL 104 09/13/2016 1241   CO2 24 09/13/2016 1241   GLUCOSE 80 09/13/2016 1241   GLUCOSE 84 01/24/2012 2210   BUN 6 09/13/2016 1241   CREATININE 0.66 09/13/2016 1241   CALCIUM 9.4 09/13/2016 1241   PROT 7.2 09/13/2016 1241   ALBUMIN 4.3 09/13/2016 1241   AST 12 09/13/2016 1241   ALT 10 09/13/2016 1241   ALKPHOS 40 09/13/2016 1241   BILITOT 0.5 09/13/2016 1241   GFRNONAA 116 09/13/2016 1241   GFRAA 133 09/13/2016 1241   No results found for: CHOL, HDL, LDLCALC, LDLDIRECT, TRIG, CHOLHDL Lab Results  Component Value Date   HGBA1C 5.7 (H) 12/09/2009   Lab Results  Component Value Date   VITAMINB12 330 09/27/2009   Lab Results  Component Value Date   TSH 0.448 (L) 09/13/2016       ASSESSMENT AND PLAN  Multiple sclerosis (HCC)  Gait disturbance  High risk medication use  Right foot drop    1.   After giving this more thought, she would like to switch from Gilenya to [place] due to her current work schedule, she would prefer to do this during November. We discussed that she will have to be off on Gilenya for 6-8 weeks before getting the Lemtrada. She has recent blood work and we will recheck labs several weeks before her Lemtrada dosing.  2.   Continue other medications.    Richard A. Felecia Shelling, MD, PhD 11/15/3662, 4:03 PM Certified in Neurology, Clinical  Neurophysiology, Sleep Medicine, Pain Medicine and Neuroimaging  [facility] [address], [place], [place] [address] [phone] ?"

## 2016-11-05 NOTE — Telephone Encounter (Signed)
After a lengthy discussion with Dr. Felecia Shelling regarding tx. options, pt. has decided to proceed with Lemtrada infusions.  Would like to have [place], 1st yr. infusion in November 2018.  She will check her calendar and call back with a specific wk. that is best for her, to see if infusions can be done that week.  She will need to stop Gilenya 6 wks. prior to [facility] infusions, and repeat CBC with diff/platelets, CMP, u/a, CD4, TSH prior to infusions.  Pt. has already been [employer] approved, so hopefully this will not be a problem. Message printed and given to [place] in the infusion suite/fim

## 2016-11-14 DIAGNOSIS — Z0289 Encounter for other administrative examinations: Secondary | ICD-10-CM

## 2016-11-27 NOTE — Telephone Encounter (Signed)
Patient calling to discuss Lemtrada and schedule  Infusion.

## 2016-11-28 NOTE — Telephone Encounter (Signed)
Rachel/BC [phone] called needs confirmation the pt is starting lemtrada and not tysabri. There has been some confusion with faxes. Please call

## 2016-11-28 NOTE — Telephone Encounter (Signed)
Jessica Roberson is calling back stating to disregard previous message. It has been handled by  intrafusion dept.

## 2016-11-28 NOTE — Telephone Encounter (Signed)
Spoke with patient to discuss message. She stated that her employer is willing to work with her so that she can begin [place] before Nov. She stated she would like to start sooner also, to avoid being near holidays. This RN advised her she must be off Gilenya for 6 weeks and have labs done before starting Lemtrada. Advised the patient that Otila Kluver RN stated she cannot start the insurance authorization process further out than 6 weeks before her first infusion. Advised her this RN will let Otila Kluver know she wants to go forward with [place] start.  Also advised will let Dr Felecia Shelling know she would like to stop Gilenya next week.  Advised the patient that she may get a call from Otila Kluver to tentatively set up her first infusion. Also advised that if Dr Felecia Shelling has any instructions for her, she will get a call back. Patient verbalized understanding, appreciation. Spoke with Otila Kluver who verbalized understanding, will call patient.

## 2016-11-28 NOTE — Telephone Encounter (Signed)
Per phone staff, this message should be disregarded. The person called the wrong office.

## 2017-01-14 ENCOUNTER — Other Ambulatory Visit (INDEPENDENT_AMBULATORY_CARE_PROVIDER_SITE_OTHER): Payer: Self-pay

## 2017-01-14 ENCOUNTER — Other Ambulatory Visit: Payer: Self-pay

## 2017-01-14 ENCOUNTER — Other Ambulatory Visit: Payer: Self-pay | Admitting: *Deleted

## 2017-01-14 DIAGNOSIS — G35 Multiple sclerosis: Secondary | ICD-10-CM

## 2017-01-14 DIAGNOSIS — Z79899 Other long term (current) drug therapy: Secondary | ICD-10-CM

## 2017-01-14 DIAGNOSIS — Z0289 Encounter for other administrative examinations: Secondary | ICD-10-CM

## 2017-01-15 LAB — COMPREHENSIVE METABOLIC PANEL
ALBUMIN: 4.3 g/dL (ref 3.5–5.5)
ALT: 7 IU/L (ref 0–32)
AST: 13 IU/L (ref 0–40)
Albumin/Globulin Ratio: 1.7 (ref 1.2–2.2)
Alkaline Phosphatase: 42 IU/L (ref 39–117)
BUN / CREAT RATIO: 8 — AB (ref 9–23)
BUN: 6 mg/dL (ref 6–20)
Bilirubin Total: 0.4 mg/dL (ref 0.0–1.2)
CALCIUM: 8.7 mg/dL (ref 8.7–10.2)
CO2: 25 mmol/L (ref 20–29)
CREATININE: 0.72 mg/dL (ref 0.57–1.00)
Chloride: 102 mmol/L (ref 96–106)
GFR calc non Af Amer: 110 mL/min/{1.73_m2} (ref >59)
GFR, EST AFRICAN AMERICAN: 126 mL/min/{1.73_m2} (ref >59)
Globulin, Total: 2.6 g/dL (ref 1.5–4.5)
Glucose: 75 mg/dL (ref 65–99)
Potassium: 4.2 mmol/L (ref 3.5–5.2)
Sodium: 140 mmol/L (ref 134–144)
TOTAL PROTEIN: 6.9 g/dL (ref 6.0–8.5)

## 2017-01-15 LAB — CBC WITH DIFFERENTIAL/PLATELET
BASOS: 2 %
Basophils Absolute: 0 10*3/uL (ref 0.0–0.2)
EOS (ABSOLUTE): 0.1 10*3/uL (ref 0.0–0.4)
EOS: 3 %
HEMOGLOBIN: 11.5 g/dL (ref 11.1–15.9)
Hematocrit: 35.2 % (ref 34.0–46.6)
IMMATURE GRANS (ABS): 0 10*3/uL (ref 0.0–0.1)
IMMATURE GRANULOCYTES: 0 %
LYMPHS: 24 %
Lymphocytes Absolute: 0.6 10*3/uL — ABNORMAL LOW (ref 0.7–3.1)
MCH: 25.3 pg — AB (ref 26.6–33.0)
MCHC: 32.7 g/dL (ref 31.5–35.7)
MCV: 78 fL — ABNORMAL LOW (ref 79–97)
MONOCYTES: 6 %
Monocytes Absolute: 0.2 10*3/uL (ref 0.1–0.9)
NEUTROS ABS: 1.8 10*3/uL (ref 1.4–7.0)
Neutrophils: 65 %
Platelets: 261 10*3/uL (ref 150–379)
RBC: 4.54 x10E6/uL (ref 3.77–5.28)
RDW: 19.3 % — ABNORMAL HIGH (ref 12.3–15.4)
WBC: 2.6 10*3/uL — ABNORMAL LOW (ref 3.4–10.8)

## 2017-01-15 LAB — HEPATIC FUNCTION PANEL: Bilirubin, Direct: 0.11 mg/dL (ref 0.00–0.40)

## 2017-01-15 LAB — TSH: TSH: 0.943 u[IU]/mL (ref 0.450–4.500)

## 2017-01-16 ENCOUNTER — Telehealth: Payer: Self-pay | Admitting: *Deleted

## 2017-01-16 NOTE — Telephone Encounter (Signed)
-----   Message from Britt Bottom, MD sent at 01/15/2017  5:39 PM EDT ----- Please let her know that the white blood cell count is a little better than the last couple of times but is still low. Her red blood cells are a little smaller in size and she should take extra iron if she can.   Thyroid test was normal

## 2017-01-16 NOTE — Telephone Encounter (Signed)
LMOM with below lab results and rec. to take otc Fe. She does not need to return this call unless she has questions/fim

## 2017-01-22 ENCOUNTER — Other Ambulatory Visit: Payer: Self-pay | Admitting: Neurology

## 2017-01-22 MED ORDER — VALACYCLOVIR HCL 500 MG PO TABS: 500.0000 mg | ORAL_TABLET | Freq: Two times a day (BID) | ORAL | 2 refills | Status: DC

## 2017-01-24 ENCOUNTER — Encounter: Payer: Self-pay | Admitting: *Deleted

## 2017-01-26 ENCOUNTER — Telehealth: Payer: Self-pay | Admitting: Neurology

## 2017-01-26 ENCOUNTER — Other Ambulatory Visit: Payer: Self-pay | Admitting: Neurology

## 2017-01-26 DIAGNOSIS — R42 Dizziness and giddiness: Secondary | ICD-10-CM

## 2017-01-26 MED ORDER — MECLIZINE HCL 12.5 MG PO TABS
25.0000 mg | ORAL_TABLET | Freq: Three times a day (TID) | ORAL | Status: DC | PRN
Start: 2017-01-26 — End: 2019-11-09

## 2017-01-26 NOTE — Telephone Encounter (Signed)
I returned call from patient who stated she had developed nausea, dizziness, vertgo and headache since getting her lemtrada infusion y`day in our office .She had taken benadyl and tylenol without benefit this am. These are likeley side effects from [place]. I recommend she take meclizine for dizziness/vertgo and tylenol ES 2 tablets every 6 hrly for headache and rest and drink enough fluids. If she got worse she should call back or go to ER. She was advised to call back on Monday and check in with our office.

## 2017-01-28 MED ORDER — METOCLOPRAMIDE HCL 10 MG PO TABS: ORAL_TABLET | ORAL | 0 refills | Status: DC

## 2017-01-28 NOTE — Addendum Note (Signed)
Addended by: France Ravens I on: 01/28/2017 02:08 PM   Modules accepted: Orders

## 2017-01-28 NOTE — Telephone Encounter (Signed)
Pt called back today her mouth is very dry, she is drinking and voiding. She is still having dizziness and it feels "weird" to swallow. Pt has taken meclizine and tylenol as prescribed. Please call asap

## 2017-01-28 NOTE — Telephone Encounter (Signed)
Spoke with Jessica Roberson. She c/o some burning when swallowing, dry mouth, increased fatigue, continued dizziness.  Per RAS, ok to try Reglan 10mg  up to bid prn #30 with 0 r/f.  Jessica Roberson is agreeable. Rx. escribed to [facility]. Will check back in 1-2 days for update, or Jessica Roberson will call back sooner if worse or new sx. develop/fim

## 2017-01-30 NOTE — Telephone Encounter (Signed)
Spoke with Jessica Roberson this afternoon--she sts. she is not back to her baseline, but is beginning to feel better--will let me know if she needs anything./fim

## 2017-02-14 ENCOUNTER — Telehealth: Payer: Self-pay | Admitting: *Deleted

## 2017-02-14 MED ORDER — VALACYCLOVIR HCL 500 MG PO TABS: 500.0000 mg | ORAL_TABLET | Freq: Two times a day (BID) | ORAL | 1 refills | Status: DC

## 2017-02-14 NOTE — Telephone Encounter (Signed)
Rx. changed to 90 day rx. and [facility]ed to [facility] per faxed request/fim

## 2017-02-21 ENCOUNTER — Ambulatory Visit: Payer: BC Managed Care – PPO | Admitting: Neurology

## 2017-02-25 ENCOUNTER — Encounter: Payer: Self-pay | Admitting: Neurology

## 2017-02-25 ENCOUNTER — Telehealth: Payer: Self-pay | Admitting: Neurology

## 2017-02-25 ENCOUNTER — Encounter: Payer: Self-pay | Admitting: Physician Assistant

## 2017-02-25 ENCOUNTER — Other Ambulatory Visit (INDEPENDENT_AMBULATORY_CARE_PROVIDER_SITE_OTHER): Payer: Self-pay

## 2017-02-25 DIAGNOSIS — G35 Multiple sclerosis: Secondary | ICD-10-CM

## 2017-02-25 DIAGNOSIS — Z79899 Other long term (current) drug therapy: Secondary | ICD-10-CM

## 2017-02-25 DIAGNOSIS — Z0289 Encounter for other administrative examinations: Secondary | ICD-10-CM

## 2017-02-25 NOTE — Telephone Encounter (Signed)
Spoke with Jessica Roberson who c/o of red spotty rash on feet first noted over the weekend. No other c/o of abnormal bleeding/bruising.  Per RAS, ok to check platelets.  Pt. also sts. her [place] has been trying to contact our office.  I don't have any missed calls.  Jessica Roberson has provided me with his name and #--Bill [phone], ext. [phone]).  I have tried this number 3 times and it is busy each time./fim

## 2017-02-25 NOTE — Addendum Note (Signed)
Addended by: France Ravens I on: 02/25/2017 02:33 PM   Modules accepted: Orders

## 2017-02-25 NOTE — Telephone Encounter (Signed)
Pt asking for a call, she is on [place] and states she has been noticing since Saturday afternoon red splotty marks on her feet and she is unsure if this is bleeding or what, it is not painful just very concerning.  Please call

## 2017-02-26 ENCOUNTER — Telehealth: Payer: Self-pay | Admitting: *Deleted

## 2017-02-26 LAB — CBC WITH DIFFERENTIAL/PLATELET
BASOS ABS: 0 10*3/uL (ref 0.0–0.2)
BASOS: 0 %
EOS (ABSOLUTE): 0 10*3/uL (ref 0.0–0.4)
EOS: 1 %
HEMATOCRIT: 34.4 % (ref 34.0–46.6)
HEMOGLOBIN: 11.8 g/dL (ref 11.1–15.9)
LYMPHS: 12 %
Lymphocytes Absolute: 0.3 10*3/uL — ABNORMAL LOW (ref 0.7–3.1)
MCH: 26.2 pg — AB (ref 26.6–33.0)
MCHC: 34.3 g/dL (ref 31.5–35.7)
MCV: 76 fL — ABNORMAL LOW (ref 79–97)
MONOCYTES: 9 %
Monocytes Absolute: 0.2 10*3/uL (ref 0.1–0.9)
NEUTROS ABS: 1.8 10*3/uL (ref 1.4–7.0)
Neutrophils: 78 %
Platelets: 220 10*3/uL (ref 150–379)
RBC: 4.51 x10E6/uL (ref 3.77–5.28)
RDW: 18.6 % — ABNORMAL HIGH (ref 12.3–15.4)
WBC: 2.3 10*3/uL — CL (ref 3.4–10.8)

## 2017-02-26 NOTE — Telephone Encounter (Signed)
-----   Message from Britt Bottom, MD sent at 02/26/2017  9:05 AM EST ----- Please let her know that the platelet count was fine and the hemoglobin was fine. Her white blood cell count is low but that's expected right after the Lemtrada infusion

## 2017-02-26 NOTE — Telephone Encounter (Signed)
Spoke with Skylie this morning and reviewed below lab results with her. She verbalized understanding of same, will call if rash worsens or new sx. present/fim

## 2017-03-07 NOTE — Telephone Encounter (Signed)
Pt calling to inform that she is over a month out of her [place] Falling treatment and she has not heard from anyone.  Pt is asking to be called

## 2017-03-07 NOTE — Telephone Encounter (Signed)
Have spoken with Jessica Roberson.  Nobody has contacted her to set up labwork. Request for lab services was faxed to [place] [facility] on 01/30/17, with fax confirmation received.   I spoke with Jessica Roberson at [facility].  They say only one sheet of the fax came thru. I have faxed the entire request for lab services [facility] request for services, and standing orders) to [facility] again today, and Jessica Roberson will urgently act on this. Jessica Roberson is aware to expect a phone call from [place] [facility] today, and will call me if she doesn't hear from them/fim

## 2017-03-13 ENCOUNTER — Other Ambulatory Visit: Payer: Self-pay | Admitting: Nurse Practitioner

## 2017-03-13 ENCOUNTER — Other Ambulatory Visit: Payer: Self-pay

## 2017-03-13 DIAGNOSIS — N6489 Other specified disorders of breast: Secondary | ICD-10-CM

## 2017-03-18 ENCOUNTER — Other Ambulatory Visit: Payer: BC Managed Care – PPO

## 2017-03-19 ENCOUNTER — Ambulatory Visit: Payer: BC Managed Care – PPO | Admitting: Nurse Practitioner

## 2017-03-19 ENCOUNTER — Encounter: Payer: Self-pay | Admitting: Nurse Practitioner

## 2017-03-19 VITALS — BP 102/78 | HR 76 | Temp 98.5°F | Ht 69.0 in | Wt 246.0 lb

## 2017-03-19 DIAGNOSIS — Z8049 Family history of malignant neoplasm of other genital organs: Secondary | ICD-10-CM | POA: Insufficient documentation

## 2017-03-19 DIAGNOSIS — F4322 Adjustment disorder with anxiety: Secondary | ICD-10-CM

## 2017-03-19 DIAGNOSIS — Z803 Family history of malignant neoplasm of breast: Secondary | ICD-10-CM

## 2017-03-19 DIAGNOSIS — R7309 Other abnormal glucose: Secondary | ICD-10-CM

## 2017-03-19 DIAGNOSIS — Z800 Family history of malignant neoplasm of digestive organs: Secondary | ICD-10-CM | POA: Insufficient documentation

## 2017-03-19 DIAGNOSIS — Z23 Encounter for immunization: Secondary | ICD-10-CM | POA: Diagnosis not present

## 2017-03-19 DIAGNOSIS — E282 Polycystic ovarian syndrome: Secondary | ICD-10-CM

## 2017-03-19 DIAGNOSIS — Z0001 Encounter for general adult medical examination with abnormal findings: Secondary | ICD-10-CM

## 2017-03-19 DIAGNOSIS — Z8041 Family history of malignant neoplasm of ovary: Secondary | ICD-10-CM

## 2017-03-19 LAB — COMPREHENSIVE METABOLIC PANEL
ALBUMIN: 4.1 g/dL (ref 3.5–5.2)
ALK PHOS: 36 U/L — AB (ref 39–117)
ALT: 8 U/L (ref 0–35)
AST: 12 U/L (ref 0–37)
BUN: 7 mg/dL (ref 6–23)
CHLORIDE: 102 meq/L (ref 96–112)
CO2: 29 meq/L (ref 19–32)
Calcium: 9.3 mg/dL (ref 8.4–10.5)
Creatinine, Ser: 0.61 mg/dL (ref 0.40–1.20)
GFR: 143.6 mL/min (ref >60.00)
GLUCOSE: 79 mg/dL (ref 70–99)
Potassium: 3.9 mEq/L (ref 3.5–5.1)
SODIUM: 139 meq/L (ref 135–145)
Total Bilirubin: 0.9 mg/dL (ref 0.2–1.2)
Total Protein: 7 g/dL (ref 6.0–8.3)

## 2017-03-19 LAB — HEMOGLOBIN A1C: Hgb A1c MFr Bld: 5.2 % (ref 4.6–6.5)

## 2017-03-19 LAB — CBC
HCT: 37.1 % (ref 36.0–46.0)
HEMOGLOBIN: 12 g/dL (ref 12.0–15.0)
MCHC: 32.4 g/dL (ref 30.0–36.0)
MCV: 82 fl (ref 78.0–100.0)
PLATELETS: 241 10*3/uL (ref 150.0–400.0)
RBC: 4.53 Mil/uL (ref 3.87–5.11)
RDW: 18 % — ABNORMAL HIGH (ref 11.5–15.5)
WBC: 2.5 10*3/uL — ABNORMAL LOW (ref 4.0–10.5)

## 2017-03-19 LAB — LIPID PANEL
CHOL/HDL RATIO: 3
Cholesterol: 182 mg/dL (ref 0–200)
HDL: 59.2 mg/dL (ref >39.00)
LDL CALC: 110 mg/dL — AB (ref 0–99)
NonHDL: 122.92
TRIGLYCERIDES: 65 mg/dL (ref 0.0–149.0)
VLDL: 13 mg/dL (ref 0.0–40.0)

## 2017-03-19 LAB — TSH: TSH: 0.37 u[IU]/mL (ref 0.35–4.50)

## 2017-03-19 NOTE — Progress Notes (Signed)
Subjective:    Patient ID: Jessica Roberson, female    DOB: 16-Apr-1981, 36 y.o.   MRN: [id number]  Patient presents today for complete physical   HPI  Multiple Sclerosis: Diagnosed 2007 Managed by Dr. Felecia Shelling. Current use of Lentrada.  Report Seasonal mood changes: Symptoms: Decreased appetite, increased anxiety , decreased energy Worse in winter. Improves in spring. Onset 63yrs ago after mother's death. Will like referral to psychology. Used xanax in past but caused increased somnolence.  Immunizations: (TDAP, Hep C screen, Pneumovax, Influenza, zoster)  Health Maintenance  Topic Date Due  . Tetanus Vaccine  04/11/2001  . Pap Smear  12/10/2013  . Flu Shot  Completed  . HIV Screening  Completed   Diet:regular.  Weight:  Wt Readings from Last 3 Encounters:  03/19/17 246 lb (111.6 kg)  11/05/16 255 lb (115.7 kg)  09/13/16 263 lb (119.3 kg)   Exercise:none.  Fall Risk: Fall Risk  03/19/2017  Falls in the past year? No   Home Safety:home with husband and daughter.  Depression/Suicide: Depression screen PHQ 2/9 03/19/2017  Decreased Interest 1  Down, Depressed, Hopeless 1  PHQ - 2 Score 2  Altered sleeping 1  Tired, decreased energy 1  Change in appetite 1  Feeling bad or failure about yourself  1  Trouble concentrating 1  Moving slowly or fidgety/restless 1  Suicidal thoughts 0  PHQ-9 Score 8  Difficult doing work/chores Not difficult at all   Pap Smear (every 10yrs for >21-29 without HPV, every 98yrs for >30-51yrs with HPV):last pap done and Mirena placed by [facility], she will call and schedule.  Vision:up to date.  Dental:up to date.  Advanced Directive: Advanced Directives 08/16/2016  Does Patient Have a Medical Advance Directive? No  Would patient like information on creating a medical advance directive? Yes (MAU/Ambulatory/Procedural Areas - Information given)  Pre-existing out of facility DNR order (yellow form or pink MOST form) -    Sexual History (birth control, marital status, STD):Mirena placed 4years ago, s/p tubal ligation.   Medications and allergies reviewed with patient and updated if appropriate.  Patient Active Problem List   Diagnosis Date Noted  . Family history of breast cancer in first degree relative 03/19/2017  . Family history of ovarian cancer 03/19/2017  . Family history of uterine cancer 03/19/2017  . Family history of stomach cancer 03/19/2017  . Pre-syncope 08/21/2016  . Other fatigue 03/01/2016  . Vitamin D deficiency 08/30/2015  . Right foot drop 08/30/2015  . Optic neuritis 03/23/2015  . Gait disturbance 09/27/2014  . Numbness 09/27/2014  . High risk medication use 09/27/2014  . Carpal tunnel syndrome 10/02/2013  . Ganglion cyst of wrist 10/02/2013  . Hand paresthesia 10/02/2013  . Knee pain 08/11/2013  . Morbid obesity ([place]) 04/02/2013  . Previous cesarean section 04/02/2013  . Sterilization 04/02/2013  . Pregnancy 04/02/2013  . Encounter for postoperative care 04/02/2013  . POLYCYSTIC OVARIAN DISEASE 06/05/2010  . SINUSITIS 06/02/2010  . HIRSUTISM 05/12/2010  . ANXIETY 03/31/2010  . HYPERSOMNIA, ASSOCIATED WITH SLEEP APNEA 12/20/2009  . HYPERGLYCEMIA 12/09/2009  . GENITAL HERPES 11/23/2009  . HERPES SIMPLEX INFECTION, TYPE I 11/23/2009  . VITAMIN D DEFICIENCY 11/23/2009  . GERD 11/08/2009  . DEPRESSION, MILD 10/31/2009  . Multiple sclerosis ([place]) 10/31/2009  . MIGRAINES, HX OF 10/31/2009  . MORBID OBESITY 09/27/2009  . PERSONAL HX COLONIC POLYPS 09/27/2009  . Abdominal pain 09/16/2009  . Allergic rhinitis 11/29/2008  . Arthropathia 11/16/2008  . Common wart 11/04/2008  .  Adiposity 10/26/2008    Current Outpatient Medications on File Prior to Visit  Medication Sig Dispense Refill  . Alemtuzumab (LEMTRADA) 12 MG/1.2ML SOLN Inject into the vein.    . cyclobenzaprine (FLEXERIL) 5 MG tablet Take 1 tablet (5 mg total) by mouth at bedtime. 90 tablet 3  . valACYclovir  (VALTREX) 500 MG tablet Take 1 tablet (500 mg total) by mouth 2 (two) times daily. 180 tablet 1  . cholecalciferol (VITAMIN D) 1000 units tablet Take 1,000 Units by mouth daily.    Marland Kitchen GILENYA 0.5 MG CAPS TAKE 1 CAPSULE BY MOUTH EVERY DAY (Patient not taking: Reported on 03/19/2017) 90 capsule 2  . metoCLOPramide (REGLAN) 10 MG tablet Take one tablet up to twice daily as needed. (Patient not taking: Reported on 03/19/2017) 30 tablet 0  . tiZANidine (ZANAFLEX) 4 MG tablet Take 1 tablet (4 mg total) by mouth 3 (three) times daily as needed for muscle spasms. (Patient not taking: Reported on 03/19/2017) 90 tablet 11  . [DISCONTINUED] omeprazole-sodium bicarbonate (ZEGERID) 40-1100 MG per capsule Take 1 capsule by mouth daily. 30 capsule 5   Current Facility-Administered Medications on File Prior to Visit  Medication Dose Route Frequency Provider Last Rate Last Dose  . meclizine (ANTIVERT) tablet 25 mg  25 mg Oral TID PRN Garvin Fila, MD        Past Medical History:  Diagnosis Date  . Allergy   . Anal fissure   . Arthritis    right knee  . Colon polyp 2005  . Depression    resolved - situational  . Migraine    history - otc med prn  . Multiple sclerosis ([place])   . Multiple sclerosis ([place])   . Neuromuscular disorder ([place])    Multiple sclerosis - no meds with 2014 pregnancy  . Obesity   . PCOS (polycystic ovarian syndrome)   . Pregnancy induced hypertension 03/2007   Resolved - [place] with 2008 pregnancy   . Ulcer 09/2009  . Vision abnormalities     Past Surgical History:  Procedure Laterality Date  . carpel tunnel surery Left   . CESAREAN SECTION  2008   wh  . CESAREAN SECTION N/A 04/02/2013   Procedure: REPEAT CESAREAN SECTION;  Surgeon: Eldred Manges, MD;  Location: [facility];  Service: Obstetrics;  Laterality: N/A;  . CHOLECYSTECTOMY  2007  . DILATION AND CURETTAGE OF UTERUS    . DILATION AND EVACUATION  05/05/2012   Procedure: DILATATION AND EVACUATION;  Surgeon: Alwyn Pea, MD;  Location: [facility];  Service: Gynecology;  Laterality: N/A;  . FOOT SURGERY  [id number]   x 2 right foot  . FOOT TENDON TRANSFER  2004  . [place]  2011  . HERNIA REPAIR  1984  . KNEE ARTHROSCOPY     x 3 - right knee    Social History   Socioeconomic History  . Marital status: Married    Spouse name: None  . Number of children: None  . Years of education: None  . Highest education level: None  Social Needs  . Financial resource strain: None  . Food insecurity - worry: None  . Food insecurity - inability: None  . Transportation needs - medical: None  . Transportation needs - non-medical: None  Occupational History  . None  Tobacco Use  . Smoking status: Never Smoker  . Smokeless tobacco: Never Used  Substance and Sexual Activity  . Alcohol use: Yes    Alcohol/week: 0.0 oz  Comment: occasional but none with pregnancy  . Drug use: No  . Sexual activity: Yes    Birth control/protection: None    Comment: preganat  Other Topics Concern  . None  Social History Narrative   Currently at [employer]; wants to enroll in RN program.   Lives with boyfriend   Regular exercise: yes    Family History  Problem Relation Age of Onset  . Stroke Mother   . Kidney disease Mother        ESRD  . Cancer Mother 47       ovarian, pancreatic,stomach,uterine,thyroid  . Multiple sclerosis Mother   . Depression Mother   . Breast cancer Mother 40  . Hypertension Father   . Diabetes Father        type II  . Sarcoidosis Father   . Asthma Daughter   . Hypertension Maternal Grandmother   . Diabetes Maternal Grandmother   . Diabetes Paternal Grandfather   . Cancer Other 47       multiple aunts w/ breast cancer        Review of Systems  Constitutional: Negative for fever, malaise/fatigue and weight loss.  HENT: Negative for congestion, sore throat and tinnitus.   Eyes:       Negative for visual changes  Respiratory: Negative for cough and  shortness of breath.   Cardiovascular: Negative for chest pain, palpitations and leg swelling.  Gastrointestinal: Negative for abdominal pain, blood in stool, constipation, diarrhea and heartburn.  Genitourinary: Negative for dysuria, frequency and urgency.  Musculoskeletal: Negative for falls, joint pain and myalgias.  Skin: Negative for rash.  Neurological: Negative for dizziness, sensory change and headaches.  Endo/Heme/Allergies: Does not bruise/bleed easily.  Psychiatric/Behavioral: Positive for depression. Negative for hallucinations, memory loss, substance abuse and suicidal ideas. The patient is nervous/anxious. The patient does not have insomnia.     Objective:   Vitals:   03/19/17 1317  BP: 102/78  Pulse: 76  Temp: 98.5 F (36.9 C)  SpO2: 99%    Body mass index is 36.33 kg/m.   Physical Examination:  Physical Exam  Constitutional: She is oriented to person, place, and time. No distress.  HENT:  Head: Normocephalic.  Right Ear: External ear normal.  Left Ear: External ear normal.  Nose: Nose normal.  Mouth/Throat: Oropharynx is clear and moist.  Eyes: Conjunctivae and EOM are normal. Pupils are equal, round, and reactive to light.  Neck: Normal range of motion. Neck supple. No thyromegaly present.  Cardiovascular: Normal rate, regular rhythm and normal heart sounds.  Pulmonary/Chest: Effort normal and breath sounds normal.  Breast exam deferred to GYN by patient  Abdominal: Soft. Bowel sounds are normal. She exhibits no distension. There is no tenderness.  Genitourinary:  Genitourinary Comments: Deferred to GYN by patient  Musculoskeletal: She exhibits deformity. She exhibits no edema or tenderness.       Right ankle: She exhibits decreased range of motion. She exhibits no swelling and normal pulse.       Right foot: There is decreased range of motion. There is no tenderness and no swelling.  S/p foot and ankle surgery with residual limited ROM(lateral). Normal  plantar and dorsiflexion. Limited movement of right toes.   Lymphadenopathy:    She has no cervical adenopathy.  Neurological: She is alert and oriented to person, place, and time.  Vitals reviewed.   ASSESSMENT and PLAN:  Louie was seen today for establish care.  Diagnoses and all orders for this visit:  Encounter for preventative adult health care exam with abnormal findings -     Comprehensive metabolic panel -     CBC -     TSH -     Lipid panel  Family history of breast cancer in first degree relative -     Ambulatory referral to Genetics  Family history of ovarian cancer -     Ambulatory referral to Genetics  Family history of uterine cancer -     Ambulatory referral to Genetics  Adjustment disorder with anxious mood -     Ambulatory referral to Psychology  POLYCYSTIC OVARIAN DISEASE -     Hemoglobin A1c  HYPERGLYCEMIA -     Hemoglobin A1c  Family history of stomach cancer  Need for influenza vaccination -     Flu Vaccine QUAD 36+ mos IM   No problem-specific Assessment & Plan notes found for this encounter.     Follow up: Return in about 1 year (around 03/19/2018) for CPE (fasting).  Wilfred Lacy, NP

## 2017-03-19 NOTE — Patient Instructions (Addendum)
Please contact GYN for PAP and breast exam. Also inquire from GYN if TDAP was given in past.  You will be contacted to schedule appt with psychology and genetic counseling.  Stable lab results. Health Maintenance, Female Adopting a healthy lifestyle and getting preventive care can go a long way to promote health and wellness. Talk with your health care provider about what schedule of regular examinations is right for you. This is a good chance for you to check in with your provider about disease prevention and staying healthy. In between checkups, there are plenty of things you can do on your own. Experts have done a lot of research about which lifestyle changes and preventive measures are most likely to keep you healthy. Ask your health care provider for more information. Weight and diet Eat a healthy diet  Be sure to include plenty of vegetables, fruits, low-fat dairy products, and lean protein.  Do not eat a lot of foods high in solid fats, added sugars, or salt.  Get regular exercise. This is one of the most important things you can do for your health. ? Most adults should exercise for at least 150 minutes each week. The exercise should increase your heart rate and make you sweat (moderate-intensity exercise). ? Most adults should also do strengthening exercises at least twice a week. This is in addition to the moderate-intensity exercise.  Maintain a healthy weight  Body mass index (BMI) is a measurement that can be used to identify possible weight problems. It estimates body fat based on height and weight. Your health care provider can help determine your BMI and help you achieve or maintain a healthy weight.  For females 57 years of age and older: ? A BMI below 18.5 is considered underweight. ? A BMI of 18.5 to 24.9 is normal. ? A BMI of 25 to 29.9 is considered overweight. ? A BMI of 30 and above is considered obese.  Watch levels of cholesterol and blood lipids  You should  start having your blood tested for lipids and cholesterol at 35 years of age, then have this test every 5 years.  You may need to have your cholesterol levels checked more often if: ? Your lipid or cholesterol levels are high. ? You are older than 34 years of age. ? You are at high risk for heart disease.  Cancer screening Lung Cancer  Lung cancer screening is recommended for adults 43-39 years old who are at high risk for lung cancer because of a history of smoking.  A yearly low-dose CT scan of the lungs is recommended for people who: ? Currently smoke. ? Have quit within the past 15 years. ? Have at least a 30-pack-year history of smoking. A pack year is smoking an average of one pack of cigarettes a day for 1 year.  Yearly screening should continue until it has been 15 years since you quit.  Yearly screening should stop if you develop a health problem that would prevent you from having lung cancer treatment.  Breast Cancer  Practice breast self-awareness. This means understanding how your breasts normally appear and feel.  It also means doing regular breast self-exams. Let your health care provider know about any changes, no matter how small.  If you are in your 20s or 30s, you should have a clinical breast exam (CBE) by a health care provider every 1-3 years as part of a regular health exam.  If you are 63 or older, have a CBE every year.  Also consider having a breast X-ray (mammogram) every year.  If you have a family history of breast cancer, talk to your health care provider about genetic screening.  If you are at high risk for breast cancer, talk to your health care provider about having an MRI and a mammogram every year.  Breast cancer gene (BRCA) assessment is recommended for women who have family members with BRCA-related cancers. BRCA-related cancers include: ? Breast. ? Ovarian. ? Tubal. ? Peritoneal cancers.  Results of the assessment will determine the need  for genetic counseling and BRCA1 and BRCA2 testing.  Cervical Cancer Your health care provider may recommend that you be screened regularly for cancer of the pelvic organs (ovaries, uterus, and vagina). This screening involves a pelvic examination, including checking for microscopic changes to the surface of your cervix (Pap test). You may be encouraged to have this screening done every 3 years, beginning at age 50.  For women ages 27-65, health care providers may recommend pelvic exams and Pap testing every 3 years, or they may recommend the Pap and pelvic exam, combined with testing for human papilloma virus (HPV), every 5 years. Some types of HPV increase your risk of cervical cancer. Testing for HPV may also be done on women of any age with unclear Pap test results.  Other health care providers may not recommend any screening for nonpregnant women who are considered low risk for pelvic cancer and who do not have symptoms. Ask your health care provider if a screening pelvic exam is right for you.  If you have had past treatment for cervical cancer or a condition that could lead to cancer, you need Pap tests and screening for cancer for at least 20 years after your treatment. If Pap tests have been discontinued, your risk factors (such as having a new sexual partner) need to be reassessed to determine if screening should resume. Some women have medical problems that increase the chance of getting cervical cancer. In these cases, your health care provider may recommend more frequent screening and Pap tests.  Colorectal Cancer  This type of cancer can be detected and often prevented.  Routine colorectal cancer screening usually begins at 35 years of age and continues through 35 years of age.  Your health care provider may recommend screening at an earlier age if you have risk factors for colon cancer.  Your health care provider may also recommend using home test kits to check for hidden blood in  the stool.  A small camera at the end of a tube can be used to examine your colon directly (sigmoidoscopy or colonoscopy). This is done to check for the earliest forms of colorectal cancer.  Routine screening usually begins at age 40.  Direct examination of the colon should be repeated every 5-10 years through 35 years of age. However, you may need to be screened more often if early forms of precancerous polyps or small growths are found.  Skin Cancer  Check your skin from head to toe regularly.  Tell your health care provider about any new moles or changes in moles, especially if there is a change in a mole's shape or color.  Also tell your health care provider if you have a mole that is larger than the size of a pencil eraser.  Always use sunscreen. Apply sunscreen liberally and repeatedly throughout the day.  Protect yourself by wearing long sleeves, pants, a wide-brimmed hat, and sunglasses whenever you are outside.  Heart disease, diabetes, and high  blood pressure  High blood pressure causes heart disease and increases the risk of stroke. High blood pressure is more likely to develop in: ? People who have blood pressure in the high end of the normal range (130-139/85-89 mm Hg). ? People who are overweight or obese. ? People who are African American.  If you are 3-100 years of age, have your blood pressure checked every 3-5 years. If you are 49 years of age or older, have your blood pressure checked every year. You should have your blood pressure measured twice-once when you are at a hospital or clinic, and once when you are not at a hospital or clinic. Record the average of the two measurements. To check your blood pressure when you are not at a hospital or clinic, you can use: ? An automated blood pressure machine at a pharmacy. ? A home blood pressure monitor.  If you are between 62 years and 2 years old, ask your health care provider if you should take aspirin to prevent  strokes.  Have regular diabetes screenings. This involves taking a blood sample to check your fasting blood sugar level. ? If you are at a normal weight and have a low risk for diabetes, have this test once every three years after 35 years of age. ? If you are overweight and have a high risk for diabetes, consider being tested at a younger age or more often. Preventing infection Hepatitis B  If you have a higher risk for hepatitis B, you should be screened for this virus. You are considered at high risk for hepatitis B if: ? You were born in a country where hepatitis B is common. Ask your health care provider which countries are considered high risk. ? Your parents were born in a high-risk country, and you have not been immunized against hepatitis B (hepatitis B vaccine). ? You have HIV or AIDS. ? You use needles to inject street drugs. ? You live with someone who has hepatitis B. ? You have had sex with someone who has hepatitis B. ? You get hemodialysis treatment. ? You take certain medicines for conditions, including cancer, organ transplantation, and autoimmune conditions.  Hepatitis C  Blood testing is recommended for: ? Everyone born from [birth year] through 1964. ? Anyone with known risk factors for hepatitis C.  Sexually transmitted infections (STIs)  You should be screened for sexually transmitted infections (STIs) including gonorrhea and chlamydia if: ? You are sexually active and are younger than 35 years of age. ? You are older than 35 years of age and your health care provider tells you that you are at risk for this type of infection. ? Your sexual activity has changed since you were last screened and you are at an increased risk for chlamydia or gonorrhea. Ask your health care provider if you are at risk.  If you do not have HIV, but are at risk, it may be recommended that you take a prescription medicine daily to prevent HIV infection. This is called pre-exposure prophylaxis  (PrEP). You are considered at risk if: ? You are sexually active and do not regularly use condoms or know the HIV status of your partner(s). ? You take drugs by injection. ? You are sexually active with a partner who has HIV.  Talk with your health care provider about whether you are at high risk of being infected with HIV. If you choose to begin PrEP, you should first be tested for HIV. You should then be tested  every 3 months for as long as you are taking PrEP. Pregnancy  If you are premenopausal and you may become pregnant, ask your health care provider about preconception counseling.  If you may become pregnant, take 400 to 800 micrograms (mcg) of folic acid every day.  If you want to prevent pregnancy, talk to your health care provider about birth control (contraception). Osteoporosis and menopause  Osteoporosis is a disease in which the bones lose minerals and strength with aging. This can result in serious bone fractures. Your risk for osteoporosis can be identified using a bone density scan.  If you are 66 years of age or older, or if you are at risk for osteoporosis and fractures, ask your health care provider if you should be screened.  Ask your health care provider whether you should take a calcium or vitamin D supplement to lower your risk for osteoporosis.  Menopause may have certain physical symptoms and risks.  Hormone replacement therapy may reduce some of these symptoms and risks. Talk to your health care provider about whether hormone replacement therapy is right for you. Follow these instructions at home:  Schedule regular health, dental, and eye exams.  Stay current with your immunizations.  Do not use any tobacco products including cigarettes, chewing tobacco, or electronic cigarettes.  If you are pregnant, do not drink alcohol.  If you are breastfeeding, limit how much and how often you drink alcohol.  Limit alcohol intake to no more than 1 drink per day for  nonpregnant women. One drink equals 12 ounces of beer, 5 ounces of wine, or 1 ounces of hard liquor.  Do not use street drugs.  Do not share needles.  Ask your health care provider for help if you need support or information about quitting drugs.  Tell your health care provider if you often feel depressed.  Tell your health care provider if you have ever been abused or do not feel safe at home. This information is not intended to replace advice given to you by your health care provider. Make sure you discuss any questions you have with your health care provider. Document Released: 10/23/2010 Document Revised: 09/15/2015 Document Reviewed: 01/11/2015 Elsevier Interactive Patient Education  [employer].

## 2017-03-20 ENCOUNTER — Encounter: Payer: Self-pay | Admitting: Nurse Practitioner

## 2017-03-28 ENCOUNTER — Encounter: Payer: Self-pay | Admitting: Neurology

## 2017-03-28 ENCOUNTER — Ambulatory Visit: Admission: RE | Admit: 2017-03-28 | Payer: BC Managed Care – PPO | Source: Ambulatory Visit

## 2017-03-28 ENCOUNTER — Ambulatory Visit
Admission: RE | Admit: 2017-03-28 | Discharge: 2017-03-28 | Disposition: A | Discharge status: Home / Self Care | Payer: BC Managed Care – PPO | Source: Ambulatory Visit | Attending: Nurse Practitioner | Admitting: Nurse Practitioner

## 2017-03-28 ENCOUNTER — Other Ambulatory Visit: Payer: Self-pay | Admitting: Nurse Practitioner

## 2017-03-28 DIAGNOSIS — N6489 Other specified disorders of breast: Secondary | ICD-10-CM

## 2017-03-31 ENCOUNTER — Other Ambulatory Visit: Payer: Self-pay | Admitting: Neurology

## 2017-04-02 ENCOUNTER — Other Ambulatory Visit: Payer: Self-pay | Admitting: Neurology

## 2017-04-09 ENCOUNTER — Ambulatory Visit (INDEPENDENT_AMBULATORY_CARE_PROVIDER_SITE_OTHER): Payer: BC Managed Care – PPO | Admitting: Nurse Practitioner

## 2017-04-09 ENCOUNTER — Encounter: Payer: Self-pay | Admitting: Nurse Practitioner

## 2017-04-09 VITALS — BP 100/70 | HR 69 | Temp 98.0°F | Ht 69.0 in | Wt 250.0 lb

## 2017-04-09 DIAGNOSIS — J320 Chronic maxillary sinusitis: Secondary | ICD-10-CM

## 2017-04-09 DIAGNOSIS — J309 Allergic rhinitis, unspecified: Secondary | ICD-10-CM

## 2017-04-09 MED ORDER — SALINE SPRAY 0.65 % NA SOLN: 1.0000 | NASAL | 0 refills | Status: DC | PRN

## 2017-04-09 MED ORDER — OXYMETAZOLINE HCL 0.05 % NA SOLN: 1.0000 | Freq: Two times a day (BID) | NASAL | 0 refills | Status: DC

## 2017-04-09 MED ORDER — FLUTICASONE PROPIONATE 50 MCG/ACT NA SUSP: 2.0000 | Freq: Every day | NASAL | 6 refills | Status: DC

## 2017-04-09 NOTE — Progress Notes (Signed)
Subjective:  Patient ID: Benedict Needy, female    DOB: February 04, 1981  Age: 36 y.o. MRN: [id number]  CC: Sinusitis (headahe,nose bleed at times, feel dry/ going on 3 days. )   Sinusitis  This is a new problem. The current episode started in the past 7 days. The problem is unchanged. There has been no fever. Associated symptoms include congestion and sinus pressure. Pertinent negatives include no chills, coughing, diaphoresis, ear pain, headaches, hoarse voice, neck pain, shortness of breath, sneezing, sore throat or swollen glands. Past treatments include oral decongestants. The treatment provided no relief.  Sinus congestion with bloody drainage.  Outpatient Medications Prior to Visit  Medication Sig Dispense Refill  . Alemtuzumab (LEMTRADA) 12 MG/1.2ML SOLN Inject into the vein.    . cyclobenzaprine (FLEXERIL) 5 MG tablet TAKE 1 TABLET (5 MG TOTAL) BY MOUTH AT BEDTIME. 90 tablet 1  . tiZANidine (ZANAFLEX) 4 MG tablet TAKE 1 TABLET BY MOUTH 3 TIMES A DAY FOR MUSCLE SPASMS 270 tablet 3  . valACYclovir (VALTREX) 500 MG tablet Take 1 tablet (500 mg total) by mouth 2 (two) times daily. 180 tablet 1  . cholecalciferol (VITAMIN D) 1000 units tablet Take 1,000 Units by mouth daily.    Marland Kitchen GILENYA 0.5 MG CAPS TAKE 1 CAPSULE BY MOUTH EVERY DAY (Patient not taking: Reported on 03/19/2017) 90 capsule 2  . metoCLOPramide (REGLAN) 10 MG tablet Take one tablet up to twice daily as needed. (Patient not taking: Reported on 03/19/2017) 30 tablet 0   Facility-Administered Medications Prior to Visit  Medication Dose Route Frequency Provider Last Rate Last Dose  . meclizine (ANTIVERT) tablet 25 mg  25 mg Oral TID PRN Garvin Fila, MD        ROS See HPI  Objective:  BP 100/70   Pulse 69   Temp 98 F (36.7 C) (Oral)   Ht 5\' 9"  (1.753 m)   Wt 250 lb (113.4 kg)   SpO2 100%   BMI 36.92 kg/m   BP Readings from Last 3 Encounters:  04/09/17 100/70  03/19/17 102/78  11/05/16 108/78    Wt  Readings from Last 3 Encounters:  04/09/17 250 lb (113.4 kg)  03/19/17 246 lb (111.6 kg)  11/05/16 255 lb (115.7 kg)    Physical Exam  Constitutional: She is oriented to person, place, and time.  HENT:  Right Ear: Tympanic membrane, external ear and ear canal normal.  Left Ear: Tympanic membrane, external ear and ear canal normal.  Nose: Mucosal edema and rhinorrhea present. Right sinus exhibits maxillary sinus tenderness and frontal sinus tenderness. Left sinus exhibits maxillary sinus tenderness and frontal sinus tenderness.  Mouth/Throat: Uvula is midline. No trismus in the jaw. Posterior oropharyngeal erythema present. No oropharyngeal exudate.  Eyes: Conjunctivae and EOM are normal. Pupils are equal, round, and reactive to light. No scleral icterus.  Neck: Normal range of motion. Neck supple.  Cardiovascular: Normal rate.  Pulmonary/Chest: Effort normal.  Lymphadenopathy:    She has no cervical adenopathy.  Neurological: She is alert and oriented to person, place, and time.  Vitals reviewed.   Lab Results  Component Value Date   WBC 2.5 (L) 03/19/2017   HGB 12.0 03/19/2017   HCT 37.1 03/19/2017   PLT 241.0 03/19/2017   GLUCOSE 79 03/19/2017   CHOL 182 03/19/2017   TRIG 65.0 03/19/2017   HDL 59.20 03/19/2017   LDLCALC 110 (H) 03/19/2017   ALT 8 03/19/2017   AST 12 03/19/2017   NA 139 03/19/2017  K 3.9 03/19/2017   CL 102 03/19/2017   CREATININE 0.61 03/19/2017   BUN 7 03/19/2017   CO2 29 03/19/2017   TSH 0.37 03/19/2017   HGBA1C 5.2 03/19/2017    Mm Diag Breast Tomo Uni Right  Result Date: 03/28/2017 CLINICAL DATA:  36 year old female presenting for first six-month follow-up of a probably benign right breast asymmetry without ultrasound correlate. EXAM: 2D DIGITAL DIAGNOSTIC UNILATERAL RIGHT MAMMOGRAM WITH CAD AND ADJUNCT TOMO COMPARISON:  Previous exam(s). ACR Breast Density Category b: There are scattered areas of fibroglandular density. FINDINGS: A focal  asymmetry in the central inferior right breast at posterior depth is stable from prior examination. No new or suspicious mammographic findings are identified in the remainder of the right breast. The parenchymal pattern is stable. Mammographic images were processed with CAD. IMPRESSION: Stable, probably benign right breast asymmetry. Recommendation is for continued six-month follow-up. RECOMMENDATION: Bilateral diagnostic mammogram with possible right breast ultrasound in 6 months. I have discussed the findings and recommendations with the patient. Results were also provided in writing at the conclusion of the visit. If applicable, a reminder letter will be sent to the patient regarding the next appointment. BI-RADS CATEGORY  3: Probably benign. Electronically Signed   By: Kristopher Oppenheim M.D.   On: 03/28/2017 10:32    Assessment & Plan:   Blanca was seen today for sinusitis.  Diagnoses and all orders for this visit:  Allergic rhinitis, unspecified seasonality, unspecified trigger -     fluticasone (FLONASE) 50 MCG/ACT nasal spray; Place 2 sprays into both nostrils daily. -     oxymetazoline (AFRIN NASAL SPRAY) 0.05 % nasal spray; Place 1 spray into both nostrils 2 (two) times daily. Use only for 3days, then stop -     sodium chloride (OCEAN) 0.65 % SOLN nasal spray; Place 1 spray into both nostrils as needed for congestion.  Chronic maxillary sinusitis -     fluticasone (FLONASE) 50 MCG/ACT nasal spray; Place 2 sprays into both nostrils daily. -     oxymetazoline (AFRIN NASAL SPRAY) 0.05 % nasal spray; Place 1 spray into both nostrils 2 (two) times daily. Use only for 3days, then stop -     sodium chloride (OCEAN) 0.65 % SOLN nasal spray; Place 1 spray into both nostrils as needed for congestion.   I am having Ellanie N. Mandile start on fluticasone, oxymetazoline, and sodium chloride. I am also having her maintain her GILENYA, cholecalciferol, Alemtuzumab, metoCLOPramide, valACYclovir,  cyclobenzaprine, and tiZANidine. We will continue to administer meclizine.  Meds ordered this encounter  Medications  . fluticasone (FLONASE) 50 MCG/ACT nasal spray    Sig: Place 2 sprays into both nostrils daily.    Dispense:  16 g    Refill:  6    Order Specific Question:   Supervising Provider    Answer:   Lucille Passy [[id number]]  . oxymetazoline (AFRIN NASAL SPRAY) 0.05 % nasal spray    Sig: Place 1 spray into both nostrils 2 (two) times daily. Use only for 3days, then stop    Dispense:  30 mL    Refill:  0    Order Specific Question:   Supervising Provider    Answer:   Lucille Passy [[id number]]  . sodium chloride (OCEAN) 0.65 % SOLN nasal spray    Sig: Place 1 spray into both nostrils as needed for congestion.    Dispense:  15 mL    Refill:  0    Order Specific Question:   Supervising Provider  Answer:   Lucille Passy [[id number]]    Follow-up: Return if symptoms worsen or fail to improve.  Wilfred Lacy, NP

## 2017-04-09 NOTE — Patient Instructions (Addendum)
URI Instructions: Flonase and Afrin use: apply 1spray of afrin in each nare, wait 50mins, then apply 2sprays of flonase in each nare. Use both nasal spray consecutively x 3days, then flonase only for at least 14days.  Encourage adequate oral hydration.  Call office for oral abx and oral prednisone if no improvement in 1week.

## 2017-04-29 ENCOUNTER — Inpatient Hospital Stay: Payer: BC Managed Care – PPO

## 2017-04-29 ENCOUNTER — Inpatient Hospital Stay: Payer: BC Managed Care – PPO | Attending: Genetic Counselor | Admitting: Genetics

## 2017-04-29 ENCOUNTER — Encounter: Payer: Self-pay | Admitting: Genetics

## 2017-04-29 DIAGNOSIS — Z806 Family history of leukemia: Secondary | ICD-10-CM | POA: Diagnosis not present

## 2017-04-29 DIAGNOSIS — Z808 Family history of malignant neoplasm of other organs or systems: Secondary | ICD-10-CM | POA: Diagnosis not present

## 2017-04-29 DIAGNOSIS — Z8041 Family history of malignant neoplasm of ovary: Secondary | ICD-10-CM

## 2017-04-29 DIAGNOSIS — Z803 Family history of malignant neoplasm of breast: Secondary | ICD-10-CM | POA: Diagnosis not present

## 2017-04-29 DIAGNOSIS — Z8042 Family history of malignant neoplasm of prostate: Secondary | ICD-10-CM

## 2017-04-29 NOTE — Progress Notes (Addendum)
REFERRING PROVIDER: Flossie Buffy, [address] [place], [place] [address]  PRIMARY PROVIDER:  Minette Brine  PRIMARY REASON FOR VISIT:  1. Family history of breast cancer in first degree relative   2. Family history of breast cancer   3. Family history of leukemia   4. Family history of thyroid cancer   5. Family history of ovarian cancer   6. Family history of prostate cancer     HISTORY OF PRESENT ILLNESS:   Jessica Roberson, a 37 y.o. female, was seen for a [facility] cancer genetics consultation at the request of Dr. Lorayne Marek due to a family history of cancer.  Jessica Roberson presents to clinic today to discuss the possibility of a hereditary predisposition to cancer, genetic testing, and to further clarify her future cancer risks, as well as potential cancer risks for family members.    Jessica Roberson is a 37 y.o. female with no personal history of cancer.    HORMONAL RISK FACTORS:  Menarche was at age 87.  First live birth at age 63.  OCP use for approximately.  Mirena 4 years ago  Ovaries intact: yes.  Hysterectomy: no.  Menopausal status: premenopausal.  HRT use: 0 years. Colonoscopy: no; not examined. Mammogram within the last year: yes. Number of breast biopsies: 0.  Past Medical History:  Diagnosis Date  . Allergy   . Anal fissure   . Arthritis    right knee  . Colon polyp 2005  . Depression    resolved - situational  . Family history of breast cancer   . Family history of leukemia   . Family history of ovarian cancer   . Family history of prostate cancer   . Family history of thyroid cancer   . Migraine    history - otc med prn  . Multiple sclerosis ([place])   . Multiple sclerosis ([place])   . Neuromuscular disorder ([place])    Multiple sclerosis - no meds with 2014 pregnancy  . Obesity   . PCOS (polycystic ovarian syndrome)   . Pregnancy induced hypertension 03/2007   Resolved - [place] with 2008 pregnancy   . Ulcer 09/2009  . Vision abnormalities     Past  Surgical History:  Procedure Laterality Date  . carpel tunnel surery Left   . CESAREAN SECTION  2008   wh  . CESAREAN SECTION N/A 04/02/2013   Procedure: REPEAT CESAREAN SECTION;  Surgeon: Eldred Manges, MD;  Location: [facility];  Service: Obstetrics;  Laterality: N/A;  . CHOLECYSTECTOMY  2007  . DILATION AND CURETTAGE OF UTERUS    . DILATION AND EVACUATION  05/05/2012   Procedure: DILATATION AND EVACUATION;  Surgeon: Alwyn Pea, MD;  Location: [facility];  Service: Gynecology;  Laterality: N/A;  . FOOT SURGERY  [id number]   x 2 right foot  . FOOT TENDON TRANSFER  2004  . [place]  2011  . HERNIA REPAIR  1984  . KNEE ARTHROSCOPY     x 3 - right knee    Social History   Socioeconomic History  . Marital status: Married    Spouse name: None  . Number of children: None  . Years of education: None  . Highest education level: None  Social Needs  . Financial resource strain: None  . Food insecurity - worry: None  . Food insecurity - inability: None  . Transportation needs - medical: None  . Transportation needs - non-medical: None  Occupational History  . None  Tobacco Use  . Smoking status: Never Smoker  . Smokeless tobacco: Never Used  Substance and Sexual Activity  . Alcohol use: Yes    Alcohol/week: 0.0 oz    Comment: occasional but none with pregnancy  . Drug use: No  . Sexual activity: Yes    Birth control/protection: None    Comment: preganat  Other Topics Concern  . None  Social History Narrative   Currently at [employer] for [employer]; wants to enroll in RN program.   Lives with boyfriend   Regular exercise: yes     FAMILY HISTORY:  We obtained a detailed, 4-generation family history.  Significant diagnoses are listed below: Family History  Problem Relation Age of Onset  . Stroke Mother   . Kidney disease Mother        ESRD  . Multiple sclerosis Mother   . Depression Mother   . Breast cancer Mother 37  . Ovarian cancer Mother 4        maybe ut?  . Thyroid cancer Mother 27       reports it was fairly 'devastating, possibly aggresive one'  . Bone cancer Mother 60       unsure if a separate primary or met  . Hypertension Father   . Diabetes Father        type II  . Sarcoidosis Father   . Asthma Daughter   . Hypertension Maternal Grandmother   . Diabetes Maternal Grandmother   . Bone cancer Maternal Grandmother 93  . Diabetes Paternal Grandfather   . Alzheimer's disease Paternal Grandfather   . Breast cancer Other 50  . Prostate cancer Maternal Uncle 65       no surgery  . Lung cancer Maternal Grandfather 30  . Leukemia Paternal Grandmother 59       died at 25  . Breast cancer Cousin 36   Jessica Roberson has a 57 year-old son and a 21 year-old daughter.  She has no siblings.   Jessica Roberson' mother had ovarian (although uterine was mentioned?) at 76.  She later developed thyroid cancer at 57 that was 'devastating' and she things it was aggressive.  Her mother then was diagnosed with breast cancer at 30 and bone cancer at 82.  She is unsure if any of these were mets or if all were primary cancers.  Her mother died at 56.   Jessica Roberson has 7 maternal uncles and 0 maternal aunts.  -1 maternal uncle was diagnosed with prostate cancer at 66.  He is now 81, and she does not know what if any treatment he is getting, but does not think he has had surgery for this diagnosis.  -6 maternal uncles with no history of cancer in their 23's-60's.   One maternal cousin (female) was diagnosed with breast cancer at 64 and is no 52.  She thinks he had a mastectomy and did not have any genetic testing she is aware of. No other maternal cousins with any history of cancer.  Jessica Roberson' maternal grandfather died at 11 due to lung cancer, and her maternal grandmother died at 63 due to bone cancer and complications of other health conditions.  This grandmother had a sister with breast cancer in her 35's.  She had 1 breast removed, and then it spread to the  other breast.   Jessica Roberson' father is 83 with no history of cancer. She has 7 paternal uncles with no history of cancer. 2 died in 33's/40's (no cancer) and  the rest are in their 60's.  She has 8 paternal aunts with no history of cancer.  They are all currently or lived to 44's-80's.  No paternal cousins with any history of cancer.  Jessica Roberson' paternal grandfather died at 24 and had Alzheimer's disease.  Her paternal grandmother died at 57 due to Leukemia.       Jessica Roberson is unaware of previous family history of genetic testing for hereditary cancer risks. Patient's maternal ancestors are of African American descent, and paternal ancestors are of African American descent. There is no reported Ashkenazi Jewish ancestry. There is no known consanguinity.  GENETIC COUNSELING ASSESSMENT: Jessica Roberson is a 37 y.o. female with a family history which is somewhat suggestive of a Hereditary Cancer Predisposition Syndrome. We, therefore, discussed and recommended the following at today's visit.   DISCUSSION: We reviewed the characteristics, features and inheritance patterns of hereditary cancer syndromes. We also discussed genetic testing, including the appropriate family members to test, the process of testing, insurance coverage and turn-around-time for results. We discussed the implications of a negative, positive and/or variant of uncertain significant result. We recommended Jessica Roberson pursue genetic testing for the Common Hereditary Cancer gene panel + thyroid cancer panel.  APC, ATM, AXIN2, BARD1, BMPR1A, BRCA1, BRCA2, BRIP1, CDH1, CDK4, CDKN2A (p14ARF), CDKN2A (p16INK4a), CHEK2, CTNNA1, DICER1, EPCAM, GREM1, KIT, MEN1, MLH1, MSH2, MSH3, MSH6, MUTYH, NBN, NF1, NHTL1, PALB2, PDGFRA, PMS2, POLD1, POLE, PTEN, RAD50, RAD51C, RAD51D, SDHB, SDHC, SDHD, SMAD4, SMARCA4. STK11, TP53, TSC1, TSC2, VHL, SDHA, HOXB13.  [facility] Thyroid Cancer Panel: APC, CHEK2, DICER1, PRKAR1A, PTEN, RET, TP53, MEN1, SDHB, SDHD, WRN.  We  discussed that only 5-10% of cancers are associated with a Hereditary cancer predisposition syndrome.  One of the most common hereditary cancer syndromes that increases breast cancer risk is called Hereditary Breast and Ovarian Cancer (HBOC) syndrome.  This syndrome is caused by mutations in the BRCA1 and BRCA2 genes.  This syndrome increases an individual's lifetime risk to develop breast, ovarian, pancreatic, and other types of cancer.  There are also many other cancer predisposition syndromes caused by mutations in several other genes.  We discussed that if she is found to have a mutation in one of these genes, it may impact future medical management recommendations such as increased cancer screenings and consideration of risk reducing surgeries.  A positive result could also have implications for the patient's family members.  A Negative result would mean we were unable to identify a hereditary component to her cancer, but does not rule out the possibility of a hereditary basis for her family's cancer.  There could be mutations that are undetectable by current technology, or in genes not yet tested or identified to increase cancer risk.    We discussed the potential to find a Variant of Uncertain Significance or VUS.  These are variants that have not yet been identified as pathogenic or benign, and it is unknown if this variant is associated with increased cancer risk or if this is a normal finding.  Most VUS's are reclassified to benign or likely benign.   It should not be used to make medical management decisions. With time, we suspect the lab will determine the significance of any VUS's identified if any.   Based on Jessica Roberson's family history of cancer, she meets medical criteria for genetic testing. Despite that she meets criteria, she may still have an out of pocket cost. We discussed that if her out of pocket cost for testing is over $  100, the laboratory will call and confirm whether she wants to  proceed with testing.  If the out of pocket cost of testing is less than $100 she will be billed by the genetic testing laboratory.   We discussed that some people do not want to undergo genetic testing due to fear of genetic discrimination.  A federal law called the Genetic Information Non-Discrimination Act (GINA) of 2008 helps protect individuals against genetic discrimination based on their genetic test results.  It impacts both health insurance and employment.  For health insurance, it protects against increased premiums, being kicked off insurance or being forced to take a test in order to be insured.  For employment it protects against hiring, firing and promoting decisions based on genetic test results.  Health status due to a cancer diagnosis is not protected under GINA.  This law does not protect life insurance, disability insurance, or other types of insurance.   Based on the patient's personal and family history, the statistical model (Tyrer Cusik)   Was used to estimate her risk of developing breast cacner. This estimates her lifetime risk of developing breast cacner to be approximately 17.3%-20.7%. This model cannot capture the risk of a female cousin with breast cancer (so female cousin was imputed).  Additionally this risk is performed with her mother not having ovarian cancer (uterine) 20.7%, and the risk with her mother having ovarian cancer is (18.0%).   The patient's lifetime breast cancer risk is a preliminary estimate based on available information using one of several models endorsed by the [place] (ACS). The ACS recommends consideration of breast MRI screening as an adjunct to mammography for patients at high risk (defined as 20% or greater lifetime risk). A more detailed breast cancer risk assessment can be considered, if clinically indicated.   We hope to clarify with the patient if she is able to get this information weather her mother had uterine or ovarian cancer.   This will change the screening recommendations in the setting of negative test results.        PLAN: After considering the risks, benefits, and limitations, Jessica Roberson  provided informed consent to pursue genetic testing and the blood sample was sent to [facility] for analysis of the Common Hereditary Cancer Panel + Thyroid cancer Panel. Results should be available within approximately 2-3 weeks' time, at which point they will be disclosed by telephone to Jessica Roberson, as will any additional recommendations warranted by these results. Jessica Roberson will receive a summary of her genetic counseling visit and a copy of her results once available. This information will also be available in Epic. We encouraged Jessica Roberson to remain in contact with cancer genetics annually so that we can continuously update the family history and inform her of any changes in cancer genetics and testing that may be of benefit for her family. Jessica Roberson questions were answered to her satisfaction today. Our contact information was provided should additional questions or concerns arise.  Based on Ms. Leino's family history, we recommended her maternal cousin with female breast cancer at 60, have genetic counseling and testing.  Additionally, other maternal relatives may meet medical criteria for genetic testing as well.   Ms. Vazques will let us know if we can be of any assistance in coordinating genetic counseling and/or testing for this family member.   Lastly, we encouraged Ms. Huskins to remain in contact with cancer genetics annually so that we can continuously update the family history and inform  her of any changes in cancer genetics and testing that may be of benefit for this family.   Ms.  Bayly questions were answered to her satisfaction today. Our contact information was provided should additional questions or concerns arise. Thank you for the referral and allowing [place] to share in the care of your patient.   Tana Felts, MS Genetic Counselor [email or web address] phone: [phone]  The patient was seen for a total of 30 minutes in face-to-face genetic counseling.

## 2017-04-30 ENCOUNTER — Encounter: Payer: Self-pay | Admitting: Neurology

## 2017-05-10 ENCOUNTER — Ambulatory Visit: Payer: Self-pay | Admitting: Genetics

## 2017-05-10 ENCOUNTER — Telehealth: Payer: Self-pay | Admitting: Genetics

## 2017-05-10 ENCOUNTER — Ambulatory Visit: Payer: BC Managed Care – PPO | Admitting: Psychology

## 2017-05-10 DIAGNOSIS — Z8041 Family history of malignant neoplasm of ovary: Secondary | ICD-10-CM

## 2017-05-10 DIAGNOSIS — Z806 Family history of leukemia: Secondary | ICD-10-CM

## 2017-05-10 DIAGNOSIS — Z1379 Encounter for other screening for genetic and chromosomal anomalies: Secondary | ICD-10-CM

## 2017-05-10 DIAGNOSIS — Z8042 Family history of malignant neoplasm of prostate: Secondary | ICD-10-CM

## 2017-05-10 DIAGNOSIS — Z803 Family history of malignant neoplasm of breast: Secondary | ICD-10-CM

## 2017-05-10 DIAGNOSIS — Z808 Family history of malignant neoplasm of other organs or systems: Secondary | ICD-10-CM

## 2017-05-10 NOTE — Telephone Encounter (Signed)
Revealed negative genetic testing.  Revealed that a VUS in BMPR1A was identified.   This normal result indicates we did not idnetify a hereditary predisposition to cancer syndrome in Jessica Roberson.  However, we did discuss her family history of  Female breast cancer, prostate cancer, and breast/ovarian cancer is still somewhat suspicious for a genetic mutation.  Therefore it is possible her relatives may have a BRCA or other genetic mutation that Jessica Roberson did not inherit and that is why her testing is norma.  It is also possible that there could be a mutation in Jessica Roberson and her family that current technology and knowledge cannot detect at this point in time.  We discussed that if her relatives, particularly those affected with cancer have genetic testing this will help to clarify her risks further.    Told her that her risk models estimate her breast cancer risk to be about 17-20% therefore she is right on the border of the ACS's recommendation for high risk breast screening (if 20% or over).  I recommended she discuss these risk estimates with her OBGYN provider and that they decide if she will pursue high risk breast screening.  If a mutation is found in the family that Jessica Roberson has tested negative for, this will significantly lower her risk for developing cancer.

## 2017-05-10 NOTE — Progress Notes (Signed)
HPI: Jessica Roberson was previously seen in the [facility] on 04/29/2017 due to a family history of cancer and concerns regarding a hereditary predisposition to cancer. Please refer to our prior cancer genetics clinic note for more information regarding Jessica Roberson's medical, social and family histories, and our assessment and recommendations, at the time. Jessica Roberson recent genetic test results were disclosed to her, as well as recommendations warranted by these results. These results and recommendations are discussed in more detail below.   FAMILY HISTORY:  We obtained a detailed, 4-generation family history.  Significant diagnoses are listed below: Family History  Problem Relation Age of Onset  . Stroke Mother   . Kidney disease Mother        ESRD  . Multiple sclerosis Mother   . Depression Mother   . Breast cancer Mother 65  . Ovarian cancer Mother 75       maybe ut?  . Thyroid cancer Mother 38       reports it was fairly 'devastating, possibly aggresive one'  . Bone cancer Mother 36       unsure if a separate primary or met  . Hypertension Father   . Diabetes Father        type II  . Sarcoidosis Father   . Asthma Daughter   . Hypertension Maternal Grandmother   . Diabetes Maternal Grandmother   . Bone cancer Maternal Grandmother 44  . Diabetes Paternal Grandfather   . Alzheimer's disease Paternal Grandfather   . Breast cancer Other 50  . Prostate cancer Maternal Uncle 65       no surgery  . Lung cancer Maternal Grandfather 80  . Leukemia Paternal Grandmother 55       died at 32  . Breast cancer Cousin 60    Jessica Roberson has a 61 year-old son and a 97 year-old daughter.  She has no siblings.   [name]' mother had ovarian (although uterine was mentioned?) at 82.  She later developed thyroid cancer at 16 that was 'devastating' and she things it was aggressive.  Her mother then was diagnosed with breast cancer at 31 and bone cancer at 28.  She is unsure if any  of these were mets or if all were primary cancers.  Her mother died at 59.   Jessica Roberson has 7 maternal uncles and 0 maternal aunts.  -1 maternal uncle was diagnosed with prostate cancer at 55.  He is now 48, and she does not know what if any treatment he is getting, but does not think he has had surgery for this diagnosis.  -6 maternal uncles with no history of cancer in their 67's-60's.   One maternal cousin (female) was diagnosed with breast cancer at 8 and is no 50.  She thinks he had a mastectomy and did not have any genetic testing she is aware of. No other maternal cousins with any history of cancer.  [name]' maternal grandfather died at 43 due to lung cancer, and her maternal grandmother died at 56 due to bone cancer and complications of other health conditions.  This grandmother had a sister with breast cancer in her 35's.  She had 1 breast removed, and then it spread to the other breast.   [name]' father is 59 with no history of cancer. She has 7 paternal uncles with no history of cancer. 2 died in 81's/40's (no cancer) and the rest are in their 70's.  She has 8 paternal  aunts with no history of cancer.  They are all currently or lived to 59's-80's.  No paternal cousins with any history of cancer.  [name]' paternal grandfather died at 20 and had Alzheimer's disease.  Her paternal grandmother died at 24 due to Leukemia.       Jessica Roberson is unaware of previous family history of genetic testing for hereditary cancer risks. Patient's maternal ancestors are of African American descent, and paternal ancestors are of African American descent. There is no reported Ashkenazi Jewish ancestry. There is no known consanguinity.  GENETIC TEST RESULTS: Genetic testing performed through [facility] Common Hereditary Cancer Panel + Thyroid Cancer Panel reported out on 05/06/2017 showed no pathogenic mutations. Stefan Church, BARD1, BMPR1A, BRCA1, BRCA2, BRIP1, CDH1, CDK4, CDKN2A (p14ARF), CDKN2A (p16INK4a), CHEK2,  CTNNA1, DICER1, EPCAM*, GREM1*, KIT, MEN1, MLH1, MSH2, MSH3, MSH6, MUTYH, NBN, NF1, PALB2, PDGFRA, PMS2, POLD1, POLE, PRKAR1A, PTEN, RAD50, RAD51C, RAD51D, RET, SDHB, SDHC, SDHD, SMAD4, SMARCA4, STK11, TP53, TSC1, TSC2, VHL, WRN*. The following genes were evaluated for sequence changes only: HOXB13*, NTHL1*, SDHA.  A variant of uncertain significance (VUS) in a gene called BMPR1A was also noted. c.431G>T (p.Gly144Val)  The test report will be scanned into EPIC and will be located under the Molecular Pathology section of the Results Review tab.A portion of the result report is included below for reference.     We discussed with Jessica Roberson that because current genetic testing is not perfect, it is possible there may be a gene mutation in one of these genes that current testing cannot detect, but that chance is small. We also discussed, that there could be another gene that has not yet been discovered, or that we have not yet tested, that is responsible for the cancer diagnoses in the family. It is also possible there is a hereditary cause for the cancer in the family that Jessica Roberson did not inherit and therefore was not identified in her testing.  Therefore, it is important to remain in touch with cancer genetics in the future so that we can continue to offer [name] the most up to date genetic testing.   Regarding the VUS in BMPR1A: At this time, it is unknown if this variant is associated with increased cancer risk or if this is a normal finding, but most variants such as this get reclassified to being inconsequential. It should not be used to make medical management decisions. With time, we suspect the lab will determine the significance of this variant, if any. If we do learn more about it, we will try to contact Jessica Roberson to discuss it further. However, it is important to stay in touch with [place] periodically and keep the address and phone number up to date.  ADDITIONAL GENETIC TESTING: We discussed  with Jessica Roberson that there are other genes that are associated with increased cancer risk that can be analyzed. The laboratories that offer this testing look at these additional genes via a hereditary cancer gene panel. Should Jessica Roberson wish to pursue additional genetic testing, we are happy to discuss and coordinate this testing, at any time.    CANCER SCREENING RECOMMENDATIONS: Jessica Roberson test result is considered negative (normal).  This means that we have not identified a hereditary predisposition to cancer in her, or an explanation for her family history of cancer at this time.   While reassuring, this does not definitively rule out a hereditary basis for her family's cancer. It is still possible that there could be genetic mutations  that are undetectable by current technology, or genetic mutations in genes that have not been tested or identified to increase cancer risk.  It is also possible there is a hereditary cause for the cancer in Jessica Roberson family that she did not inherit and therefore was not detected in her genetic testing.   Therefore, Jessica Roberson was advised to continue following the cancer screening guidelines provided by her primary healthcare providers. Other factors such as her personal and family history may still affect her cancer risk.    Based on the Ms. Weidemann's family history of cancer, as well as her genetic test results, statistical models ([place]rer [place], [place], and [place]) were used to estimate her risk of developing breast cancer. These estimate her lifetime risk of developing breast cancer to be approximately 11.79% to 20.3%.  BRCAPro: 11.79%, Tyrer [place]: 17.4%, BOADICEA 20.3%. The patient's lifetime breast cancer risk is a preliminary estimate based on available information using one of several models endorsed by the [place] (ACS). The ACS recommends consideration of breast MRI screening as an adjunct to mammography for patients at high risk (defined as 20% or  greater lifetime risk). A more detailed breast cancer risk assessment can be considered, if clinically indicated.   One risk model (BOADICEA) has determined Ms. Pyle risk to be 20% or greater.   This indicates she may be recommended to have high risk breast screening, however, given the conflicting estimations (some being below and some being above 20%)  Ms. Barrack should discuss her individual situation with her referring physician and determine a breast cancer screening plan with which they are both comfortable.  Having genetic test results of other relatives (particularly those affected with cancer) could significantly help in refining this risk estimate.    Her ovarian cancer risk was estimated to be between 1.49%-3.2%.    3.2        RECOMMENDATIONS FOR FAMILY MEMBERS: Individuals in this family might be at some increased risk of developing cancer, over the general population risk, simply due to the family history of cancer. We recommended women in this family have a yearly mammogram beginning at age 55, or 65 years younger than the earliest onset of cancer, an annual clinical breast exam, and perform monthly breast self-exams. Women in this family should also have a gynecological exam as recommended by their primary provider. All family members should have a colonoscopy by age 80.  All family members should inform their physicians about the family history of cancer so their doctors can make the most appropriate screening recommendations for them.   Based on Ms. Fleishman's family history, we recommended her maternal relatives, particularly those affected with cancer (her uncle with prostate cancer and cousin with female breast cancer), have genetic counseling and testing. Ms. Loree will let us know if we can be of any assistance in coordinating genetic counseling and/or testing for*these family members. Having the results of these relatives' genetic testing could be helpful in better understanding  Ms. Well's cancer risks.   FOLLOW-UP: Lastly, we discussed with Ms. Muzyka that cancer genetics is a rapidly advancing field and it is possible that new genetic tests will be appropriate for her and/or her family members in the future. We encouraged her to remain in contact with cancer genetics on an annual basis so we can update her personal and family histories and let her know of advances in cancer genetics that may benefit this family.   Our contact number was provided. Ms. Leoni questions  were answered to her satisfaction, and she knows she is welcome to call us at anytime with additional questions or concerns.   Ferol Luz, MS Genetic Counselor [email or web address]

## 2017-05-16 ENCOUNTER — Encounter: Payer: Self-pay | Admitting: Genetics

## 2017-05-16 DIAGNOSIS — Z1379 Encounter for other screening for genetic and chromosomal anomalies: Secondary | ICD-10-CM | POA: Insufficient documentation

## 2017-05-22 ENCOUNTER — Telehealth: Payer: Self-pay | Admitting: Neurology

## 2017-05-22 NOTE — Telephone Encounter (Signed)
Pt is requesting a call back from RN to discuss her monthly blood work that will need to be done. Pt stating they were doing it at her house but because of work schedule it would be easier for pt to come in.

## 2017-05-23 ENCOUNTER — Ambulatory Visit: Payer: BC Managed Care – PPO | Admitting: Neurology

## 2017-05-28 NOTE — Telephone Encounter (Signed)
[place]. It is not a problem for her to go to [facility] (she can even come into our office). I just need to know which [place] she wants to go to so I can fax orders to them. I have let the [place] program know she will not need the traveling lab tech any longer./fim

## 2017-05-28 NOTE — Telephone Encounter (Signed)
Patient is calling back stating she never received a returned call from previous message.

## 2017-05-29 NOTE — Telephone Encounter (Signed)
[place].  B/C of the way billing accts are set up, [facility] labs are billed to [employer], not pt. or pt's insurance), she can't come to our office for [place] lab work.  Will have to go to a [facility] that is classified as a Presenter, broadcasting.  There are 2 in [place].  One at [address], phone# [phone], and this [facility] is also open on Sat. from 8a-12p.  The other [facility] is at [address]. Phone# [phone].  I just need to know which of these sites she would like to have blood drawn at, so I can fax orders to the correct site/fim

## 2017-05-29 NOTE — Telephone Encounter (Signed)
LMOM that I have given standing [place] orders to the phlebotomist in our lab [facility] staffed lab).  She is going to see what needs to be done in order to place lab orders in the computer so pt. can have labs drawn in our office.  I will call Latayna back if there is a problem with having labs drawn here/fim

## 2017-05-29 NOTE — Telephone Encounter (Signed)
Patient returning a call stating she can come to our office next Thursday and have lab done here.

## 2017-05-31 NOTE — Telephone Encounter (Signed)
[place]. Need to know which [facility] she would like to go to (see below message) for [employer].  She is not able to come to our office b/c of the way the [facility] billing is done--labs have to be done at a [facility]/fim

## 2017-06-03 NOTE — Telephone Encounter (Signed)
Patient calling stating she does not need orders for labs. A nurse came to her home and did labs. A returned call is not needed unless there are questions.

## 2017-06-03 NOTE — Telephone Encounter (Signed)
Noted/fim 

## 2017-06-27 ENCOUNTER — Encounter: Payer: Self-pay | Admitting: Neurology

## 2017-07-01 ENCOUNTER — Telehealth: Payer: Self-pay | Admitting: *Deleted

## 2017-07-01 DIAGNOSIS — R350 Frequency of micturition: Secondary | ICD-10-CM

## 2017-07-01 DIAGNOSIS — R8290 Unspecified abnormal findings in urine: Secondary | ICD-10-CM

## 2017-07-01 DIAGNOSIS — G35 Multiple sclerosis: Secondary | ICD-10-CM

## 2017-07-01 MED ORDER — SULFAMETHOXAZOLE-TRIMETHOPRIM 800-160 MG PO TABS: 1.0000 | ORAL_TABLET | Freq: Two times a day (BID) | ORAL | 0 refills | Status: DC

## 2017-07-01 NOTE — Telephone Encounter (Signed)
[facility] of [facility].  Would like for pt. to come in for a urine culture., and will send Bactrim DS in to pharmacy/fim

## 2017-07-01 NOTE — Telephone Encounter (Signed)
Spoke with Riata. She will try and come in tomorrow for u/a, c&s. [facility] per her request. She will start this after coming in for culture.  If she can't come in for the culture tomorrow, will go ahead and start Bactrim, (and not c&s)/fim

## 2017-07-01 NOTE — Addendum Note (Signed)
Addended by: France Ravens I on: 07/01/2017 12:16 PM   Modules accepted: Orders

## 2017-07-02 ENCOUNTER — Other Ambulatory Visit: Payer: Self-pay

## 2017-07-02 ENCOUNTER — Other Ambulatory Visit (INDEPENDENT_AMBULATORY_CARE_PROVIDER_SITE_OTHER): Payer: Self-pay

## 2017-07-02 ENCOUNTER — Other Ambulatory Visit (INDEPENDENT_AMBULATORY_CARE_PROVIDER_SITE_OTHER): Payer: BC Managed Care – PPO

## 2017-07-02 DIAGNOSIS — R350 Frequency of micturition: Secondary | ICD-10-CM

## 2017-07-02 DIAGNOSIS — G35 Multiple sclerosis: Secondary | ICD-10-CM

## 2017-07-02 DIAGNOSIS — Z0289 Encounter for other administrative examinations: Secondary | ICD-10-CM

## 2017-07-02 DIAGNOSIS — R8290 Unspecified abnormal findings in urine: Secondary | ICD-10-CM

## 2017-07-03 LAB — URINALYSIS, ROUTINE W REFLEX MICROSCOPIC
BILIRUBIN UA: NEGATIVE
Glucose, UA: NEGATIVE
KETONES UA: NEGATIVE
Leukocytes, UA: NEGATIVE
NITRITE UA: NEGATIVE
Protein, UA: NEGATIVE
RBC UA: NEGATIVE
Specific Gravity, UA: 1.012 (ref 1.005–1.030)
UUROB: 0.2 mg/dL (ref 0.2–1.0)
pH, UA: 6.5 (ref 5.0–7.5)

## 2017-07-04 ENCOUNTER — Telehealth: Payer: Self-pay | Admitting: *Deleted

## 2017-07-04 LAB — URINE CULTURE

## 2017-07-04 NOTE — Telephone Encounter (Signed)
-----   Message from Britt Bottom, MD sent at 07/04/2017  2:09 PM EDT ----- Please let her know that the urinalysis and the urine culture were ok.

## 2017-07-04 NOTE — Telephone Encounter (Signed)
LMOM with below lab results.  She does not need to return this call unless she has questions/fim 

## 2017-07-08 NOTE — Telephone Encounter (Signed)
Pt called requesting a call back, stating she will need a note for work stating that she need to wear sneakers due to her foot drop. Also that the labs done at her home may have been switch and needing to know if shell have to repeat blood work. Please call to advise

## 2017-07-09 ENCOUNTER — Encounter: Payer: Self-pay | Admitting: *Deleted

## 2017-07-09 NOTE — Telephone Encounter (Signed)
LMTC./fim 

## 2017-07-09 NOTE — Telephone Encounter (Signed)
Spoke with Phil.  She will pick letter (which is awaiting RAS sig) in our office.  I have reviewed lab results with her. U/A done for [facility] labs (not collected in our office) indicated possible uti, however f/u u/a and culture/sensitivity collected in our office were negative for u/a.  I have explained this may be an example of external contamination of her first u/a specimen. This could have occurred in many ways.  She sts. the first u/a was not a clean catch u/a.  This may account for contamination.  She verbalized understanding of same/fim

## 2017-07-10 NOTE — Telephone Encounter (Signed)
Letter up front [facility]/fim 

## 2017-08-29 ENCOUNTER — Encounter: Payer: Self-pay | Admitting: Neurology

## 2017-08-29 ENCOUNTER — Ambulatory Visit (INDEPENDENT_AMBULATORY_CARE_PROVIDER_SITE_OTHER): Payer: BC Managed Care – PPO | Admitting: Neurology

## 2017-08-29 VITALS — BP 161/96 | HR 77 | Ht 69.0 in | Wt 245.5 lb

## 2017-08-29 DIAGNOSIS — G35 Multiple sclerosis: Secondary | ICD-10-CM

## 2017-08-29 DIAGNOSIS — R200 Anesthesia of skin: Secondary | ICD-10-CM | POA: Diagnosis not present

## 2017-08-29 DIAGNOSIS — Z79899 Other long term (current) drug therapy: Secondary | ICD-10-CM | POA: Diagnosis not present

## 2017-08-29 DIAGNOSIS — R269 Unspecified abnormalities of gait and mobility: Secondary | ICD-10-CM | POA: Diagnosis not present

## 2017-08-29 DIAGNOSIS — R5383 Other fatigue: Secondary | ICD-10-CM | POA: Diagnosis not present

## 2017-08-29 NOTE — Progress Notes (Signed)
[facility]  PATIENT: Jessica Roberson DOB: November 27, 1980  REFERRING DOCTOR OR PCP:  Debbrah Alar SOURCE: patient  _________________________________   HISTORICAL  CHIEF COMPLAINT:  Chief Complaint  Patient presents with  . Multiple Sclerosis    Patient currently on Lemtrada.     HISTORY OF PRESENT ILLNESS:  Jessica Roberson is a 37 yo woman with MS .     Update 08/29/2017: She had her 5 day course of Lemtrada.    She tolerated it well.  Next infusion will be a 3 day course on October  She feels her MS is stable.   No new exacerbations or symptoms.   She has old right lower leg numbness that has been present x years.    Now she has a burning dysesthesia and allodynia in her toes.   She has a right foot drop, worse with longer distances tha can affect gait some.    She has some stumbles but no falls.      She is on tizanidine for right leg spasticity.   Flexeril caused bad dreams and she stopped.    Bladder is doing well.    Vision is fine.    She notes more fatigue as the temperature increases.  She is sleeping well most nights.    She feels she is more irritable but she denies depression.    She has mild anxiety at times.  This is not bad enough for her to want to treat.    Cognition is good.    From 11/05/2016:  MS:  She has been on Gilenya off and on for these 6 or 7 years. She stopped during pregnancy she has tolerated Gilenya well but has had abnormal lab values with low white blood cell and low lymphocyte count, even after changing to every other day. Although she has not had definite clinical exacerbations recently, the last MRI performed in a couple months ago showed a moderate size left frontal periventricular focus that develop during the interim   we had discussed that it appears as though the Gilenya is not optimal between the low white blood cell count and the changes on MRI. Options would include continuing on Gilenya or switching to one of the higher  efficacy medications.   We have discussed Lemtrada, ocrelizumab and Tysabri. She was once on Tysabri and tolerated it well. When she stopped she was concerned about PML but she is JCV antibody negative.  Additionally, Holland Falling is of interest to her due to the dosing regimen.   She denies any new symptoms. She continues to have some right leg weakness and numbness. Tizanidine helps the spasticity some.    MS History:   She was diagnosed with multiple sclerosis in 2006 after presenting with left facial numbness and vision changes. MRIs of the brain were consistent with MS. She also had a lumbar puncture in the spinal fluid was consistent with MS. She initially saw a Dr. at [facility] neurologic and then began to see Dr. [name] at [facility].   She was placed on Betaseron but switched to Copaxone due to injection site reactions. Unfortunately, she also had injection site reactions on Copaxone. Around 2010, she was started on Tysabri.  Due to a concern about PML, she wished to switch therapy. She was screened for a drug study but could not get the MRI's due to braces. She started Gilenya in 2012. Done well on that therapy she stopped twice, once for insurance reasons and once for pregnancy  and resumed therapy after each pause. She has not had any definite exacerbations while on Gilenya. She tolerates it very well. She had optic neuritis in 2017.   MRI 08/2016 showed a new focus not present in 2016.   REVIEW OF SYSTEMS: Constitutional: No fevers, chills, sweats, or change in appetite.  Fatigue when hot.    Eyes: see above Ear, nose and throat: No hearing loss, ear pain, nasal congestion, sore throat Cardiovascular: No chest pain, palpitations Respiratory: No shortness of breath at rest or with exertion.   No wheezes GastrointestinaI: No nausea, vomiting, diarrhea, abdominal pain, fecal incontinence Genitourinary: No dysuria, urinary retention or frequency.  No nocturia. Musculoskeletal: No neck pain,  back pain Integumentary: No rash, pruritus, skin lesions Neurological: as above Psychiatric: No depression at this time.  No anxiety Endocrine: No palpitations, diaphoresis, change in appetite, change in weigh or increased thirst Hematologic/Lymphatic: No anemia, purpura, petechiae. Allergic/Immunologic: No itchy/runny eyes, nasal congestion, recent allergic reactions, rashes  ALLERGIES: Allergies  Allergen Reactions  . Aspirin Anaphylaxis  . Penicillins Anaphylaxis  . Cyclobenzaprine Itching    Bad dreams  . Hydromorphone Hcl Itching  . Tramadol Hcl Hives    HOME MEDICATIONS:  Current Outpatient Medications:  .  Alemtuzumab (LEMTRADA) 12 MG/1.2ML SOLN, Inject into the vein., Disp: , Rfl:  .  fluticasone (FLONASE) 50 MCG/ACT nasal spray, Place 2 sprays into both nostrils daily., Disp: 16 g, Rfl: 6 .  oxymetazoline (AFRIN NASAL SPRAY) 0.05 % nasal spray, Place 1 spray into both nostrils 2 (two) times daily. Use only for 3days, then stop, Disp: 30 mL, Rfl: 0 .  tiZANidine (ZANAFLEX) 4 MG tablet, TAKE 1 TABLET BY MOUTH 3 TIMES A DAY FOR MUSCLE SPASMS, Disp: 270 tablet, Rfl: 3  Current Facility-Administered Medications:  .  meclizine (ANTIVERT) tablet 25 mg, 25 mg, Oral, TID PRN, Garvin Fila, MD  PAST MEDICAL HISTORY: Past Medical History:  Diagnosis Date  . Allergy   . Anal fissure   . Arthritis    right knee  . Colon polyp 2005  . Depression    resolved - situational  . Family history of breast cancer   . Family history of leukemia   . Family history of ovarian cancer   . Family history of prostate cancer   . Family history of thyroid cancer   . Migraine    history - otc med prn  . Multiple sclerosis ([place])   . Multiple sclerosis ([place])   . Neuromuscular disorder ([place])    Multiple sclerosis - no meds with 2014 pregnancy  . Obesity   . PCOS (polycystic ovarian syndrome)   . Pregnancy induced hypertension 03/2007   Resolved - [place] with 2008 pregnancy   . Ulcer  09/2009  . Vision abnormalities     PAST SURGICAL HISTORY: Past Surgical History:  Procedure Laterality Date  . carpel tunnel surery Left   . CESAREAN SECTION  2008   wh  . CESAREAN SECTION N/A 04/02/2013   Procedure: REPEAT CESAREAN SECTION;  Surgeon: Eldred Manges, MD;  Location: [facility];  Service: Obstetrics;  Laterality: N/A;  . CHOLECYSTECTOMY  2007  . DILATION AND CURETTAGE OF UTERUS    . DILATION AND EVACUATION  05/05/2012   Procedure: DILATATION AND EVACUATION;  Surgeon: Alwyn Pea, MD;  Location: [facility];  Service: Gynecology;  Laterality: N/A;  . FOOT SURGERY  0947,0962   x 2 right foot  . FOOT TENDON TRANSFER  2004  . HAMMER TOE SURGERY  2011  . [place]  . KNEE ARTHROSCOPY     x 3 - right knee    FAMILY HISTORY: Family History  Problem Relation Age of Onset  . Stroke Mother   . Kidney disease Mother        ESRD  . Multiple sclerosis Mother   . Depression Mother   . Breast cancer Mother 62  . Ovarian cancer Mother 99       maybe ut?  . Thyroid cancer Mother 52       reports it was fairly 'devastating, possibly aggresive one'  . Bone cancer Mother 63       unsure if a separate primary or met  . Hypertension Father   . Diabetes Father        type II  . Sarcoidosis Father   . Asthma Daughter   . Hypertension Maternal Grandmother   . Diabetes Maternal Grandmother   . Bone cancer Maternal Grandmother 12  . Diabetes Paternal Grandfather   . Alzheimer's disease Paternal Grandfather   . Breast cancer Other 50  . Prostate cancer Maternal Uncle 65       no surgery  . Lung cancer Maternal Grandfather 51  . Leukemia Paternal Grandmother 54       died at 13  . Breast cancer Cousin 54    SOCIAL HISTORY:  Social History   Socioeconomic History  . Marital status: Married    Spouse name: Not on file  . Number of children: Not on file  . Years of education: Not on file  . Highest education level: Not on file  Occupational History  . Not  on file  Social Needs  . Financial resource strain: Not on file  . Food insecurity:    Worry: Not on file    Inability: Not on file  . Transportation needs:    Medical: Not on file    Non-medical: Not on file  Tobacco Use  . Smoking status: Never Smoker  . Smokeless tobacco: Never Used  Substance and Sexual Activity  . Alcohol use: Yes    Alcohol/week: 0.0 oz    Comment: occasional but none with pregnancy  . Drug use: No  . Sexual activity: Yes    Birth control/protection: None    Comment: preganat  Lifestyle  . Physical activity:    Days per week: Not on file    Minutes per session: Not on file  . Stress: Not on file  Relationships  . Social connections:    Talks on phone: Not on file    Gets together: Not on file    Attends religious service: Not on file    Active member of club or organization: Not on file    Attends meetings of clubs or organizations: Not on file    Relationship status: Not on file  . Intimate partner violence:    Fear of current or ex partner: Not on file    Emotionally abused: Not on file    Physically abused: Not on file    Forced sexual activity: Not on file  Other Topics Concern  . 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   Lives with boyfriend   Regular exercise: yes     PHYSICAL EXAM  Vitals:   08/29/17 0832  BP: (!) 161/96  Pulse: 77  Weight: 245 lb 8 oz (111.4 kg)  Height: 5' 9"  (  1.753 m)   No data found.     Body mass index is 36.25 kg/m.    General: The patient is well-developed and well-nourished and in no acute distress   Neurologic Exam  Mental status: The patient is alert and oriented x 3 at the time of the examination. The patient has apparent normal recent and remote memory, with an apparently normal attention span and concentration ability.   Speech is normal.  Cranial nerves:   Extraocular muscles are normal. The facial strength  and sensation are normal.  Trapezius strength is normal.   Palatal elevation and tongue protrusion is midline. Trapezius strength is normal.    No obvious hearing deficits are noted.  Motor:  Muscle bulk is normal.   Tone is mildly increased in right leg. Strength is  5 / 5 in all 4 extremities except 4 to 4+/5 right foot/ankle..   Sensory: Sensory testing is intact to touch and vibration sensation in the arms but reduced touch/temp in right foot  Coordination: Cerebellar testing reveals good finger-nose-finger.  Gait and station: Station is normal.   She has a slight right foot drop and her tandem gait is mildly wide.   Romberg is negative.  Reflexes: Deep tendon reflexes are symmetric and normal bilaterally.       DIAGNOSTIC DATA (LABS, IMAGING, TESTING) - I reviewed patient records, labs, notes, testing and imaging myself where available.  Lab Results  Component Value Date   WBC 2.5 (L) 03/19/2017   HGB 12.0 03/19/2017   HCT 37.1 03/19/2017   MCV 82.0 03/19/2017   PLT 241.0 03/19/2017      Component Value Date/Time   NA 139 03/19/2017 1411   NA 140 01/14/2017 1438   K 3.9 03/19/2017 1411   CL 102 03/19/2017 1411   CO2 29 03/19/2017 1411   GLUCOSE 79 03/19/2017 1411   BUN 7 03/19/2017 1411   BUN 6 01/14/2017 1438   CREATININE 0.61 03/19/2017 1411   CALCIUM 9.3 03/19/2017 1411   PROT 7.0 03/19/2017 1411   PROT 6.9 01/14/2017 1438   ALBUMIN 4.1 03/19/2017 1411   ALBUMIN 4.3 01/14/2017 1438   AST 12 03/19/2017 1411   ALT 8 03/19/2017 1411   ALKPHOS 36 (L) 03/19/2017 1411   BILITOT 0.9 03/19/2017 1411   BILITOT 0.4 01/14/2017 1438   GFRNONAA 110 01/14/2017 1438   GFRAA 126 01/14/2017 1438   Lab Results  Component Value Date   CHOL 182 03/19/2017   HDL 59.20 03/19/2017   LDLCALC 110 (H) 03/19/2017   TRIG 65.0 03/19/2017   CHOLHDL 3 03/19/2017   Lab Results  Component Value Date   HGBA1C 5.2 03/19/2017   Lab Results  Component Value Date   VITAMINB12 330  09/27/2009   Lab Results  Component Value Date   TSH 0.37 03/19/2017       ASSESSMENT AND PLAN  Multiple sclerosis ([place]) - Plan: CBC with Differential/Platelet, Basic metabolic panel, TSH, Urinalysis, Routine w reflex microscopic  Numbness  High risk medication use - Plan: CBC with Differential/Platelet, Basic metabolic panel, TSH, Urinalysis, Routine w reflex microscopic  Gait disturbance  Other fatigue    1.  She will continue [place] --- 3 d infusion in October.   Check labs as she missed her last draw. 2.   Continue tizanidine 3.   Stay active and exercise 4.    rtc 6 months, call sooner if new or worsening symptoms or other issues.    Jacky Hartung A. Felecia Shelling, MD, PhD  10/28/2421, 5:36 AM Certified in Neurology, Clinical Neurophysiology, Sleep Medicine, Pain Medicine and Neuroimaging  [facility] [address] [place], [place] [address] [phone] ?"

## 2017-08-30 LAB — CBC WITH DIFFERENTIAL/PLATELET
BASOS: 1 %
Basophils Absolute: 0 10*3/uL (ref 0.0–0.2)
EOS (ABSOLUTE): 0.1 10*3/uL (ref 0.0–0.4)
EOS: 4 %
HEMATOCRIT: 37.9 % (ref 34.0–46.6)
Hemoglobin: 12.1 g/dL (ref 11.1–15.9)
IMMATURE GRANS (ABS): 0 10*3/uL (ref 0.0–0.1)
Immature Granulocytes: 0 %
LYMPHS: 21 %
Lymphocytes Absolute: 0.5 10*3/uL — ABNORMAL LOW (ref 0.7–3.1)
MCH: 26.9 pg (ref 26.6–33.0)
MCHC: 31.9 g/dL (ref 31.5–35.7)
MCV: 84 fL (ref 79–97)
MONOCYTES: 6 %
Monocytes Absolute: 0.2 10*3/uL (ref 0.1–0.9)
NEUTROS PCT: 68 %
Neutrophils Absolute: 1.8 10*3/uL (ref 1.4–7.0)
Platelets: 227 10*3/uL (ref 150–379)
RBC: 4.49 x10E6/uL (ref 3.77–5.28)
RDW: 16.8 % — ABNORMAL HIGH (ref 12.3–15.4)
WBC: 2.6 10*3/uL — ABNORMAL LOW (ref 3.4–10.8)

## 2017-08-30 LAB — BASIC METABOLIC PANEL
BUN/Creatinine Ratio: 12 (ref 9–23)
BUN: 9 mg/dL (ref 6–20)
CALCIUM: 8.9 mg/dL (ref 8.7–10.2)
CO2: 22 mmol/L (ref 20–29)
Chloride: 104 mmol/L (ref 96–106)
Creatinine, Ser: 0.77 mg/dL (ref 0.57–1.00)
GFR, EST AFRICAN AMERICAN: 116 mL/min/{1.73_m2} (ref >59)
GFR, EST NON AFRICAN AMERICAN: 100 mL/min/{1.73_m2} (ref >59)
Glucose: 78 mg/dL (ref 65–99)
Potassium: 4.5 mmol/L (ref 3.5–5.2)
Sodium: 140 mmol/L (ref 134–144)

## 2017-08-30 LAB — TSH: TSH: 0.539 u[IU]/mL (ref 0.450–4.500)

## 2017-09-02 ENCOUNTER — Telehealth: Payer: Self-pay | Admitting: *Deleted

## 2017-09-02 NOTE — Telephone Encounter (Signed)
-----   Message from Britt Bottom, MD sent at 09/01/2017 12:30 PM EDT ----- Please let the patient know that the lab work is fine.

## 2017-09-02 NOTE — Telephone Encounter (Signed)
Spoke with Jessica Roberson and reviewed below lab results.  She verbalized understanding of same/fim

## 2017-09-30 ENCOUNTER — Other Ambulatory Visit: Payer: Self-pay | Admitting: Neurology

## 2017-09-30 ENCOUNTER — Encounter: Payer: Self-pay | Admitting: Neurology

## 2017-10-01 ENCOUNTER — Other Ambulatory Visit: Payer: Self-pay

## 2017-10-31 ENCOUNTER — Encounter: Payer: Self-pay | Admitting: Neurology

## 2017-11-27 ENCOUNTER — Encounter: Payer: Self-pay | Admitting: Neurology

## 2017-12-04 ENCOUNTER — Telehealth: Payer: Self-pay | Admitting: Neurology

## 2017-12-04 NOTE — Telephone Encounter (Signed)
Since this past Sunday patient has had pain all over her entire body and fatigued. She is not sure if she needs an appointment. Please call and discuss.

## 2017-12-04 NOTE — Telephone Encounter (Signed)
Spoke with Briel this morning.  She [place], sts. it was hot so they went in the early am and tried not to stay too long, but "the heat got to me."  Sts. she has had generalized body aches, increased fatigue and h/a since then. Has not been able to go to work. Sx. same since onset. No sx of infection. No new numbness or weakness. I gave options--1--monitor sx, 2--check with RAS to see if he feels IV SM is appropriate, or 3--ov with RAS.  She sts. since she has not been able to go to work, she feels she needs to be seen, get work note. Appt. given 9am tomorrow/fim

## 2017-12-05 ENCOUNTER — Other Ambulatory Visit: Payer: Self-pay

## 2017-12-05 ENCOUNTER — Encounter: Payer: Self-pay | Admitting: Neurology

## 2017-12-05 ENCOUNTER — Telehealth: Payer: Self-pay | Admitting: Neurology

## 2017-12-05 ENCOUNTER — Ambulatory Visit (INDEPENDENT_AMBULATORY_CARE_PROVIDER_SITE_OTHER): Payer: BLUE CROSS/BLUE SHIELD | Admitting: Neurology

## 2017-12-05 VITALS — BP 162/94 | HR 60 | Resp 18 | Ht 69.0 in | Wt 236.5 lb

## 2017-12-05 DIAGNOSIS — R200 Anesthesia of skin: Secondary | ICD-10-CM

## 2017-12-05 DIAGNOSIS — R5383 Other fatigue: Secondary | ICD-10-CM

## 2017-12-05 DIAGNOSIS — R269 Unspecified abnormalities of gait and mobility: Secondary | ICD-10-CM

## 2017-12-05 DIAGNOSIS — G35 Multiple sclerosis: Secondary | ICD-10-CM | POA: Diagnosis not present

## 2017-12-05 DIAGNOSIS — M21371 Foot drop, right foot: Secondary | ICD-10-CM

## 2017-12-05 NOTE — Progress Notes (Signed)
[facility]  PATIENT: Jessica Roberson DOB: 12-Apr-1981  REFERRING DOCTOR OR PCP:  Debbrah Alar SOURCE: patient  _________________________________   HISTORICAL  CHIEF COMPLAINT:  Chief Complaint  Patient presents with  . Multiple Sclerosis    1st yr. Lemtrada infusions were October 1-5, 2018. Sts. Jessica Roberson spent a lot of time outside over the weekend, became overheated and has had increased fatigue, h/a, body aches since then.  Has been unable to work/fim    HISTORY OF PRESENT ILLNESS:  Jessica Roberson is a 37 yo woman with MS .   Update 12/05/2017: Jessica Roberson is worked in today due to increased symptoms including fatigue, headaches, fatigue and myalgias that began over the weekend.  Of note, Jessica Roberson was outdoors and overheated much at that time.   [place].   When Jessica Roberson was ready to leave, Jessica Roberson couldn't walk the distance back to the car and security helped her get back.    Jessica Roberson felt better that night and the next day.  Then Jessica Roberson went bowing Monday night and another outside event Monday afternoon at her kids school.   Since then, her fatigue is much worse.    Her left leg seemed slow (usually good leg).  Jessica Roberson has a nagging headache and has arm and back pain.      Gait is more sluggish and left leg seems to be dragging some (new).    No new numbness or dysesthesia.   Bladder is fine.   Vision is fine now (was mildly blurry on Sunday; no h/o ON).    Fatigue is worse.   Cognition is fine.    Mood is fine.   Jessica Roberson has slept normally.     As a disease modifying therapy, Jessica Roberson was switched to [place] and Jessica Roberson had a 5-day course in early October 2018 and will be due for her 3-day course in a couple months.  Jessica Roberson tolerated it fairly well.  Jessica Roberson is compliant with the [facility] program.  Update 08/29/2017: Jessica Roberson had her 5 day course of Lemtrada.    Jessica Roberson tolerated it well.  Next infusion will be a 3 day course on October  Jessica Roberson feels her MS is stable.   No new  exacerbations or symptoms.   Jessica Roberson has old right lower leg numbness that has been present x years.    Now Jessica Roberson has a burning dysesthesia and allodynia in her toes.   Jessica Roberson has a right foot drop, worse with longer distances tha can affect gait some.    Jessica Roberson has some stumbles but no falls.      Jessica Roberson is on tizanidine for right leg spasticity.   Flexeril caused bad dreams and Jessica Roberson stopped.    Bladder is doing well.    Vision is fine.    Jessica Roberson notes more fatigue as the temperature increases.  Jessica Roberson is sleeping well most nights.    Jessica Roberson feels Jessica Roberson is more irritable but Jessica Roberson denies depression.    Jessica Roberson has mild anxiety at times.  This is not bad enough for her to want to treat.    Cognition is good.    From 11/05/2016:  MS:  Jessica Roberson has been on Gilenya off and on for these 6 or 7 years. Jessica Roberson stopped during pregnancy Jessica Roberson has tolerated Gilenya well but has had abnormal lab values with low white blood cell and low lymphocyte count, even after changing to every other day. Although Jessica Roberson has not had definite  clinical exacerbations recently, the last MRI performed in a couple months ago showed a moderate size left frontal periventricular focus that develop during the interim   we had discussed that it appears as though the Gilenya is not optimal between the low white blood cell count and the changes on MRI. Options would include continuing on Gilenya or switching to one of the higher efficacy medications.   We have discussed Lemtrada, ocrelizumab and Tysabri. Jessica Roberson was once on Tysabri and tolerated it well. When Jessica Roberson stopped Jessica Roberson was concerned about PML but Jessica Roberson is JCV antibody negative.  Additionally, Holland Falling is of interest to her due to the dosing regimen.   Jessica Roberson denies any new symptoms. Jessica Roberson continues to have some right leg weakness and numbness. Tizanidine helps the spasticity some.    MS History:   Jessica Roberson was diagnosed with multiple sclerosis in 2006 after presenting with left facial numbness and vision changes. MRIs of the brain were consistent  with [name] also had a lumbar puncture in the spinal fluid was consistent with [name] initially saw a Dr. at [facility] neurolog[facility] and then began to see Dr. [name] at [facility].   Jessica Roberson was placed on Betaseron but switched to Copaxone due to injection site reactions. Unfortunately, Jessica Roberson also had injection site reactions on Copaxone. Around 2010, Jessica Roberson was started on Tysabri.  Due to a concern about PML, Jessica Roberson wished to switch therapy. Jessica Roberson was screened for a drug study but could not get the MRI's due to braces. Jessica Roberson started Gilenya in 2012. Done well on that therapy Jessica Roberson stopped twice, once for insurance reasons and once for pregnancy and resumed therapy after each pause. Jessica Roberson has not had any definite exacerbations while on Gilenya. Jessica Roberson tolerates it very well. Jessica Roberson had optic neuritis in 2017.   MRI 08/2016 showed a new focus not present in 2016.   REVIEW OF SYSTEMS: Constitutional: No fevers, chills, sweats, or change in appetite.  Jessica Roberson notes a lot more fatigue. Eyes: see above Ear, nose and throat: No hearing loss, ear pain, nasal congestion, sore throat Cardiovascular: No chest pain, palpitations Respiratory: No shortness of breath at rest or with exertion.   No wheezes GastrointestinaI: No nausea, vomiting, diarrhea, abdominal pain, fecal incontinence Genitourinary: No dysuria, urinary retention or frequency.  No nocturia. Musculoskeletal: No neck pain, back pain.   Jessica Roberson is noting muscle aches. Integumentary: No rash, pruritus, skin lesions Neurological: as above Psychiatric: No depression at this time.  No anxiety Endocrine: No palpitations, diaphoresis, change in appetite, change in weigh or increased thirst Hematologic/Lymphatic: No anemia, purpura, petechiae. Allergic/Immunologic: No itchy/runny eyes, nasal congestion, recent allergic reactions, rashes  ALLERGIES: Allergies  Allergen Reactions  . Aspirin Anaphylaxis  . Penicillins Anaphylaxis  . Cyclobenzaprine Itching    Bad dreams  .  Hydromorphone Hcl Itching  . Tramadol Hcl Hives    HOME MEDICATIONS:  Current Outpatient Medications:  .  Alemtuzumab (LEMTRADA) 12 MG/1.2ML SOLN, Inject into the vein., Disp: , Rfl:  .  cyclobenzaprine (FLEXERIL) 5 MG tablet, TAKE 1 TABLET BY MOUTH EVERY DAY AT BEDTIME, Disp: 90 tablet, Rfl: 1 .  fluticasone (FLONASE) 50 MCG/ACT nasal spray, Place 2 sprays into both nostrils daily., Disp: 16 g, Rfl: 6 .  tiZANidine (ZANAFLEX) 4 MG tablet, TAKE 1 TABLET BY MOUTH 3 TIMES A DAY FOR MUSCLE SPASMS, Disp: 270 tablet, Rfl: 3  Current Facility-Administered Medications:  .  meclizine (ANTIVERT) tablet 25 mg, 25 mg, Oral, TID PRN, Garvin Fila, MD  PAST  MEDICAL HISTORY: Past Medical History:  Diagnosis Date  . Allergy   . Anal fissure   . Arthritis    right knee  . Colon polyp 2005  . Depression    resolved - situational  . Family history of breast cancer   . Family history of leukemia   . Family history of ovarian cancer   . Family history of prostate cancer   . Family history of thyroid cancer   . Migraine    history - otc med prn  . Multiple sclerosis ([place])   . Multiple sclerosis ([place])   . Neuromuscular disorder ([place])    Multiple sclerosis - no meds with 2014 pregnancy  . Obesity   . PCOS (polycystic ovarian syndrome)   . Pregnancy induced hypertension 03/2007   Resolved - Hope with 2008 pregnancy   . Ulcer 09/2009  . Vision abnormalities     PAST SURGICAL HISTORY: Past Surgical History:  Procedure Laterality Date  . carpel tunnel surery Left   . CESAREAN SECTION  2008   wh  . CESAREAN SECTION N/A 04/02/2013   Procedure: REPEAT CESAREAN SECTION;  Surgeon: Eldred Manges, MD;  Location: [facility];  Service: Obstetrics;  Laterality: N/A;  . CHOLECYSTECTOMY  2007  . DILATION AND CURETTAGE OF UTERUS    . DILATION AND EVACUATION  05/05/2012   Procedure: DILATATION AND EVACUATION;  Surgeon: Alwyn Pea, MD;  Location: [facility];  Service: Gynecology;  Laterality: N/A;  .  FOOT SURGERY  6568,1275   x 2 right foot  . FOOT TENDON TRANSFER  2004  . [place]  2011  . HERNIA REPAIR  1984  . KNEE ARTHROSCOPY     x 3 - right knee    FAMILY HISTORY: Family History  Problem Relation Age of Onset  . Stroke Mother   . Kidney disease Mother        ESRD  . Multiple sclerosis Mother   . Depression Mother   . Breast cancer Mother 73  . Ovarian cancer Mother 24       maybe ut?  . Thyroid cancer Mother 108       reports it was fairly 'devastating, possibly aggresive one'  . Bone cancer Mother 64       unsure if a separate primary or met  . Hypertension Father   . Diabetes Father        type II  . Sarcoidosis Father   . Asthma Daughter   . Hypertension Maternal Grandmother   . Diabetes Maternal Grandmother   . Bone cancer Maternal Grandmother 71  . Diabetes Paternal Grandfather   . Alzheimer's disease Paternal Grandfather   . Breast cancer Other 50  . Prostate cancer Maternal Uncle 65       no surgery  . Lung cancer Maternal Grandfather 50  . Leukemia Paternal Grandmother 80       died at 55  . Breast cancer Cousin 71    SOCIAL HISTORY:  Social History   Socioeconomic History  . Marital status: Married    Spouse name: Not on file  . Number of children: Not on file  . Years of education: Not on file  . Highest education level: Not on file  Occupational History  . Not on file  Social Needs  . Financial resource strain: Not on file  . Food insecurity:    Worry: Not on file    Inability: Not on file  . Transportation needs:  Medical: Not on file    Non-medical: Not on file  Tobacco Use  . Smoking status: Never Smoker  . Smokeless tobacco: Never Used  Substance and Sexual Activity  . Alcohol use: Yes    Comment: occasional but none with pregnancy  . Drug use: No  . Sexual activity: Yes    Birth control/protection: None    Comment: preganat  Lifestyle  . Physical activity:    Days per week: Not on file    Minutes per  session: Not on file  . Stress: Not on file  Relationships  . Social connections:    Talks on phone: Not on file    Gets together: Not on file    Attends religious service: Not on file    Active member of club or organization: Not on file    Attends meetings of clubs or organizations: Not on file    Relationship status: Not on file  . Intimate partner violence:    Fear of current or ex partner: Not on file    Emotionally abused: Not on file    Physically abused: Not on file    Forced sexual activity: Not on file  Other Topics Concern  . 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   Lives with boyfriend   Regular exercise: yes     PHYSICAL EXAM  Vitals:   12/05/17 0903  BP: (!) 162/94  Pulse: 60  Resp: 18  Weight: 236 lb 8 oz (107.3 kg)  Height: 5' 9"  (1.753 m)     Body mass index is 34.92 kg/m.   General: The patient is well-developed and well-nourished and in no acute distress  Neurologic Exam  Mental status: The patient is alert and oriented x 3 at the time of the examination. The patient has apparent normal recent and remote memory, with an apparently normal attention span and concentration ability.   Speech is normal.  Cranial nerves:   Extraocular muscles are normal.  Facial strength and sensation is normal.  Trapezius strength is normal.  Palatal elevation and tongue protrusion are midline.  No obvious hearing deficits are noted.  Motor:  Muscle bulk is normal.   Tone is mildly increased in right leg. Strength is  5 / 5 in all 4 extremities except 4 to 4+/5 right foot/ankle..   Sensory: Sensory testing is intact to touch and vibration sensation in the arms but reduced touch/temp in right foot  Coordination: Cerebellar testing shows good finger-nose-finger but reduced right heel-to-shin with fairly normal left heel-to-shin  Gait and station: Station is normal.   Jessica Roberson has a right foot  drop.  The tandem gait is wide.  Romberg is negative.  Reflexes: Deep tendon reflexes are symmetric and normal bilaterally.       DIAGNOSTIC DATA (LABS, IMAGING, TESTING) - I reviewed patient records, labs, notes, testing and imaging myself where available.  Lab Results  Component Value Date   WBC 2.6 (L) 08/29/2017   HGB 12.1 08/29/2017   HCT 37.9 08/29/2017   MCV 84 08/29/2017   PLT 227 08/29/2017      Component Value Date/Time   NA 140 08/29/2017 0858   K 4.5 08/29/2017 0858   CL 104 08/29/2017 0858   CO2 22 08/29/2017 0858   GLUCOSE 78 08/29/2017 0858   GLUCOSE 79 03/19/2017 1411   BUN 9 08/29/2017 0858   CREATININE 0.77 08/29/2017 0858   CALCIUM  8.9 08/29/2017 0858   PROT 7.0 03/19/2017 1411   PROT 6.9 01/14/2017 1438   ALBUMIN 4.1 03/19/2017 1411   ALBUMIN 4.3 01/14/2017 1438   AST 12 03/19/2017 1411   ALT 8 03/19/2017 1411   ALKPHOS 36 (L) 03/19/2017 1411   BILITOT 0.9 03/19/2017 1411   BILITOT 0.4 01/14/2017 1438   GFRNONAA 100 08/29/2017 0858   GFRAA 116 08/29/2017 0858   Lab Results  Component Value Date   CHOL 182 03/19/2017   HDL 59.20 03/19/2017   LDLCALC 110 (H) 03/19/2017   TRIG 65.0 03/19/2017   CHOLHDL 3 03/19/2017   Lab Results  Component Value Date   HGBA1C 5.2 03/19/2017   Lab Results  Component Value Date   VITAMINB12 330 09/27/2009   Lab Results  Component Value Date   TSH 0.539 08/29/2017       ASSESSMENT AND PLAN  Foot drop, right  Multiple sclerosis (HCC)  Other fatigue  Numbness  Gait disturbance   1.  Jessica Roberson is noting more symptoms and I will check an MRI of the brain and cervical spine to determine if Jessica Roberson is having significant breakthrough disease.  If Jessica Roberson is having significant breakthrough disease, we will need to consider whether we continue with Lemtrada or switch to different disease modifying therapy.  I will have her get 1 day of IV Solu-Medrol and consider more based on her response.    2.   Continue  tizanidine for myalgias 3.   Stay active and exercise.   We discussed staying hydrated and trying to stay cool especially when Jessica Roberson does activities like Jessica Roberson did recently. 4.    rtc 4 months, call sooner if new or worsening symptoms or other issues.    Richard A. Felecia Shelling, MD, PhD 1/44/8185, 6:31 AM Certified in Neurology, Clinical Neurophysiology, Sleep Medicine, Pain Medicine and Neuroimaging  [facility] [address] [place], [place] [address] [phone] ?"

## 2017-12-05 NOTE — Telephone Encounter (Signed)
BCBS/Medicaid bcbs Josem Kaufmann: [id number] (exp. 12/05/17 to 01/03/18) order sent to GI. They will reach out to the pt to schedule.

## 2017-12-14 ENCOUNTER — Other Ambulatory Visit: Payer: Self-pay

## 2017-12-14 ENCOUNTER — Inpatient Hospital Stay: Admission: RE | Admit: 2017-12-14 | Payer: Self-pay | Source: Ambulatory Visit

## 2017-12-27 ENCOUNTER — Encounter: Payer: Self-pay | Admitting: Neurology

## 2018-01-07 ENCOUNTER — Other Ambulatory Visit: Payer: Self-pay

## 2018-01-09 ENCOUNTER — Other Ambulatory Visit: Payer: Self-pay

## 2018-01-16 ENCOUNTER — Ambulatory Visit
Admission: RE | Admit: 2018-01-16 | Discharge: 2018-01-16 | Disposition: A | Discharge status: Home / Self Care | Payer: Medicaid Other | Source: Ambulatory Visit | Attending: Neurology | Admitting: Neurology

## 2018-01-16 DIAGNOSIS — G35 Multiple sclerosis: Secondary | ICD-10-CM

## 2018-01-16 MED ORDER — GADOBENATE DIMEGLUMINE 529 MG/ML IV SOLN
20.0000 mL | Freq: Once | INTRAVENOUS | Status: AC | PRN
  Administered 2018-01-16: 20 mL via INTRAVENOUS

## 2018-01-20 ENCOUNTER — Telehealth: Payer: Self-pay | Admitting: *Deleted

## 2018-01-20 NOTE — Telephone Encounter (Signed)
Spoke with Rehsetta and reviewed below MRI results.  She verbalized understanding of same/fim

## 2018-01-20 NOTE — Telephone Encounter (Signed)
Spoke with Eisley and reviewed below MRI results. She verbalized understanding of same/fim

## 2018-01-20 NOTE — Telephone Encounter (Signed)
-----   Message from Britt Bottom, MD sent at 01/19/2018  7:35 PM EDT ----- Please let the patient know that the MRI did not show any new lesions.

## 2018-01-20 NOTE — Telephone Encounter (Signed)
-----   Message from Britt Bottom, MD sent at 01/19/2018  7:38 PM EDT ----- Please let her know that the MRI of the cervical spine did show one MS plaque that was not present in 2007.   We do not have more recent MRIs of the cervical spine for comparison..  It does not look new and could have occurred during the times she was off medication.

## 2018-01-22 ENCOUNTER — Telehealth: Payer: Self-pay | Admitting: Neurology

## 2018-01-22 MED ORDER — VALACYCLOVIR HCL 500 MG PO TABS: 500.0000 mg | ORAL_TABLET | Freq: Two times a day (BID) | ORAL | 0 refills | Status: DC

## 2018-01-22 NOTE — Telephone Encounter (Signed)
Spoke with Jessica Roberson--she sts. she got a call from the [place] with [facility], asking if she has taken her premeds. I explained those will be given in the infusion suite before the Lemtrada infusion is started. (same day as [place]).  She does need to tale Valtrex 500mg  bid for 3 mos. following the infusioin. [facility] [place] and she will go ahead and pick it up, aware she can start it the day before or day of Lemtrada infusion/fim

## 2018-01-22 NOTE — Telephone Encounter (Signed)
Pt states Dr Felecia Shelling was supposed to call in some pre infusion medications for her, pt is asking for a call back about that

## 2018-01-22 NOTE — Addendum Note (Signed)
Addended by: France Ravens I on: 01/22/2018 10:50 AM   Modules accepted: Orders

## 2018-01-28 ENCOUNTER — Telehealth: Payer: Self-pay | Admitting: Neurology

## 2018-01-28 NOTE — Telephone Encounter (Signed)
Patient states there is an issue with insurance for [facility]. She was unable to get her dose today. She is wondering if there is something we can work on, on our end?

## 2018-01-28 NOTE — Telephone Encounter (Signed)
Spoke with Jessica Roberson.  She received day 1 of yr. 2 Lemtrada infusions yesterday.  [facility] realized she lost her private ins. and is now on Medicaid. [place] was not done with Medicaid. They are working to get [place] approved with Medicaid or get free drug from [employer]

## 2018-01-29 NOTE — Telephone Encounter (Signed)
Spoke with Jessica Roberson.  She sts. she has spoken with [employer] and they told her Medicaid has approved [place] and they have reached out to [facility] (in [place]) with this information.  I will give this information to Jessica Roberson to see if she can check on it.  Jessica Roberson's side effects (body aches, reflux) are same as the side effects she experienced with last yr's infusions, only not as severe since she has only had one infusion so far. She will take Ibuprofen, Tizanidine and call back if no better./fim

## 2018-01-29 NOTE — Telephone Encounter (Signed)
Pt has called RN Faith to inform her she still has not heard back from [facility] and she is having side effects from the 1 dose.  Pt states she is having sever body pain and reflux.  Please call

## 2018-01-29 NOTE — Telephone Encounter (Signed)
Yes- I have emailed.

## 2018-01-30 NOTE — Telephone Encounter (Signed)
I spoke with Jessica Roberson and provided an update on Lemtrada infusions.  Dr. Felecia Shelling and Jinny Blossom have been working with [facility] since the start of the day today to resolve this and determine the best way to get her infused/fim

## 2018-01-30 NOTE — Telephone Encounter (Signed)
Spoke with Jessica Roberson and explained we can infuse her Lemtrada tomorrow and Monday. She verbalized understanding of same, is agreeable with these dates./fim

## 2018-01-30 NOTE — Telephone Encounter (Signed)
Pt requesting a call, stating she is wanting to know if RN has heard anything further regarding her infusions

## 2018-02-01 ENCOUNTER — Other Ambulatory Visit: Payer: Self-pay | Admitting: Neurology

## 2018-02-01 ENCOUNTER — Telehealth: Payer: Self-pay | Admitting: Neurology

## 2018-02-01 MED ORDER — ONDANSETRON HCL 4 MG PO TABS: 4.0000 mg | ORAL_TABLET | Freq: Three times a day (TID) | ORAL | 0 refills | Status: DC | PRN

## 2018-02-01 NOTE — Telephone Encounter (Signed)
I returned call from patient complaining of headache and nausea following lemtruda infusion y`day similar to last infusion. She has taken motrin and tylenol alternatingly which has helped but she needed help with nausea, I prescribed zofran 4 mg prn q 8 hrly and asked her to call office on Monday or go to Er if headache worsens.

## 2018-02-27 ENCOUNTER — Encounter: Payer: Self-pay | Admitting: Neurology

## 2018-03-03 ENCOUNTER — Ambulatory Visit: Payer: BC Managed Care – PPO | Admitting: Neurology

## 2018-03-28 ENCOUNTER — Encounter: Payer: Self-pay | Admitting: Neurology

## 2018-04-02 ENCOUNTER — Encounter: Payer: Self-pay | Admitting: Neurology

## 2018-04-02 ENCOUNTER — Ambulatory Visit (INDEPENDENT_AMBULATORY_CARE_PROVIDER_SITE_OTHER): Payer: Medicaid Other | Admitting: Neurology

## 2018-04-02 ENCOUNTER — Other Ambulatory Visit: Payer: Self-pay

## 2018-04-02 VITALS — BP 134/76 | HR 61 | Resp 18 | Ht 66.0 in | Wt 234.0 lb

## 2018-04-02 DIAGNOSIS — Z79899 Other long term (current) drug therapy: Secondary | ICD-10-CM

## 2018-04-02 DIAGNOSIS — M21371 Foot drop, right foot: Secondary | ICD-10-CM

## 2018-04-02 DIAGNOSIS — G35 Multiple sclerosis: Secondary | ICD-10-CM | POA: Diagnosis not present

## 2018-04-02 DIAGNOSIS — R269 Unspecified abnormalities of gait and mobility: Secondary | ICD-10-CM

## 2018-04-02 DIAGNOSIS — R200 Anesthesia of skin: Secondary | ICD-10-CM

## 2018-04-02 MED ORDER — GABAPENTIN 300 MG PO CAPS: ORAL_CAPSULE | ORAL | 11 refills | Status: DC

## 2018-04-02 NOTE — Progress Notes (Signed)
[facility]  PATIENT: Jessica Roberson DOB: [birth year]  REFERRING DOCTOR OR PCP:  Debbrah Alar SOURCE: patient  _________________________________   HISTORICAL  CHIEF COMPLAINT:  Chief Complaint  Patient presents with  . Multiple Sclerosis    Rm. 12.  2nd year Lemtrada infusions were given in Oct. 2019. Sts. she is doing well/fim    HISTORY OF PRESENT ILLNESS:  Jessica Roberson is a 37 y.o. woman with MS .   Update 04/02/2018: She had her second year of Lemtrada and tolerated it well.    No infusion reactions or infections.     She is on Valtrex.    She is compliant with REMS.      She has some pain in her right leg which has been numb in the past due to her MS.    She has mild right leg weakness.  She is unable to lift the right toes up and right ankle dorsiflexion is weak.    Pain is worse if she walks longer distances.   Pain extends from the knee to the foot /toes.     There is always a low level pain and it intensifies at times.     She has no falls.   She can climb stairs easily.     Bladder is fine.   Vision is fine.    Fatigue is not much of a problem,    She sleeps well.     Mood fluctuates and she is sometimes agitated, often if in more pain.     She is anxious at times.   Cognition is doing well.  Update 12/05/2017: Jessica Roberson is worked in today due to increased symptoms including fatigue, headaches, fatigue and myalgias that began over the weekend.  Of note, she was outdoors and overheated much at that time.   [place]   When she was ready to leave, she couldn't walk the distance back to the car and security helped her get back.    She felt better that night and the next day.  Then she went bowing Monday night and another outside event Monday afternoon at her kids school.   Since then, her fatigue is much worse.    Her left leg seemed slow (usually good leg).  She has a nagging headache and has arm and back pain.       Gait is more sluggish and left leg seems to be dragging some (new).    No new numbness or dysesthesia.   Bladder is fine.   Vision is fine now (was mildly blurry on Sunday; no h/o ON).    Fatigue is worse.   Cognition is fine.    Mood is fine.   She has slept normally.     As a disease modifying therapy, she was switched to [place] and she had a 5-day course in early October 2018 and will be due for her 3-day course in a couple months.  She tolerated it fairly well.  She is compliant with the REMS program.  Update 08/29/2017: She had her 5 day course of Lemtrada.    She tolerated it well.  Next infusion will be a 3 day course on October  She feels her MS is stable.   No new exacerbations or symptoms.   She has old right lower leg numbness that has been present x years.    Now she has a burning dysesthesia and allodynia in her toes.  She has a right foot drop, worse with longer distances tha can affect gait some.    She has some stumbles but no falls.      She is on tizanidine for right leg spasticity.   Flexeril caused bad dreams and she stopped.    Bladder is doing well.    Vision is fine.    She notes more fatigue as the temperature increases.  She is sleeping well most nights.    She feels she is more irritable but she denies depression.    She has mild anxiety at times.  This is not bad enough for her to want to treat.    Cognition is good.    From 11/05/2016:  MS:  She has been on Gilenya off and on for these 6 or 7 years. She stopped during pregnancy she has tolerated Gilenya well but has had abnormal lab values with low white blood cell and low lymphocyte count, even after changing to every other day. Although she has not had definite clinical exacerbations recently, the last MRI performed in a couple months ago showed a moderate size left frontal periventricular focus that develop during the interim   we had discussed that it appears as though the Gilenya is not optimal between the low white  blood cell count and the changes on MRI. Options would include continuing on Gilenya or switching to one of the higher efficacy medications.   We have discussed Lemtrada, ocrelizumab and Tysabri. She was once on Tysabri and tolerated it well. When she stopped she was concerned about PML but she is JCV antibody negative.  Additionally, Jessica Roberson is of interest to her due to the dosing regimen.   She denies any new symptoms. She continues to have some right leg weakness and numbness. Tizanidine helps the spasticity some.    MS History:   She was diagnosed with multiple sclerosis in 2006 after presenting with left facial numbness and vision changes. MRIs of the brain were consistent with MS. She also had a lumbar puncture in the spinal fluid was consistent with MS. She initially saw a Dr. at [facility] neurologic and then began to see Dr. [name] at [facility].   She was placed on Betaseron but switched to Copaxone due to injection site reactions. Unfortunately, she also had injection site reactions on Copaxone. Around 2010, she was started on Tysabri.  Due to a concern about PML, she wished to switch therapy. She was screened for a drug study but could not get the MRI's due to braces. She started Gilenya in 2012. Done well on that therapy she stopped twice, once for insurance reasons and once for pregnancy and resumed therapy after each pause. She has not had any definite exacerbations while on Gilenya. She tolerates it very well. She had optic neuritis in 2017.   MRI 08/2016 showed a new focus not present in 2016.   REVIEW OF SYSTEMS: Constitutional: No fevers, chills, sweats, or change in appetite.  She notes a lot more fatigue. Eyes: see above Ear, nose and throat: No hearing loss, ear pain, nasal congestion, sore throat Cardiovascular: No chest pain, palpitations Respiratory: No shortness of breath at rest or with exertion.   No wheezes GastrointestinaI: No nausea, vomiting, diarrhea, abdominal pain,  fecal incontinence Genitourinary: No dysuria, urinary retention or frequency.  No nocturia. Musculoskeletal: No neck pain, back pain.   She is noting muscle aches. Integumentary: No rash, pruritus, skin lesions Neurological: as above Psychiatric: No depression at this time.  No anxiety Endocrine: No palpitations, diaphoresis, change in appetite, change in weigh or increased thirst Hematologic/Lymphatic: No anemia, purpura, petechiae. Allergic/Immunologic: No itchy/runny eyes, nasal congestion, recent allergic reactions, rashes  ALLERGIES: Allergies  Allergen Reactions  . Aspirin Anaphylaxis  . Penicillins Anaphylaxis  . Cyclobenzaprine Itching    Bad dreams  . Hydromorphone Hcl Itching  . Tramadol Hcl Hives    HOME MEDICATIONS:  Current Outpatient Medications:  .  Alemtuzumab (LEMTRADA) 12 MG/1.2ML SOLN, Inject into the vein., Disp: , Rfl:  .  cyclobenzaprine (FLEXERIL) 5 MG tablet, TAKE 1 TABLET BY MOUTH EVERY DAY AT BEDTIME, Disp: 90 tablet, Rfl: 1 .  fluticasone (FLONASE) 50 MCG/ACT nasal spray, Place 2 sprays into both nostrils daily., Disp: 16 g, Rfl: 6 .  tiZANidine (ZANAFLEX) 4 MG tablet, TAKE 1 TABLET BY MOUTH 3 TIMES A DAY FOR MUSCLE SPASMS, Disp: 270 tablet, Rfl: 3 .  valACYclovir (VALTREX) 500 MG tablet, Take 1 tablet (500 mg total) by mouth 2 (two) times daily., Disp: 180 tablet, Rfl: 0  Current Facility-Administered Medications:  .  meclizine (ANTIVERT) tablet 25 mg, 25 mg, Oral, TID PRN, Garvin Fila, MD  PAST MEDICAL HISTORY: Past Medical History:  Diagnosis Date  . Allergy   . Anal fissure   . Arthritis    right knee  . Colon polyp 2005  . Depression    resolved - situational  . Family history of breast cancer   . Family history of leukemia   . Family history of ovarian cancer   . Family history of prostate cancer   . Family history of thyroid cancer   . Migraine    history - otc med prn  . Multiple sclerosis ([place])   . Multiple sclerosis ([place])    . Neuromuscular disorder ([place])    Multiple sclerosis - no meds with 2014 pregnancy  . Obesity   . PCOS (polycystic ovarian syndrome)   . Pregnancy induced hypertension 03/2007   Resolved - [place] with 2008 pregnancy   . Ulcer 09/2009  . Vision abnormalities     PAST SURGICAL HISTORY: Past Surgical History:  Procedure Laterality Date  . carpel tunnel surery Left   . CESAREAN SECTION  2008   wh  . CESAREAN SECTION N/A 04/02/2013   Procedure: REPEAT CESAREAN SECTION;  Surgeon: Eldred Manges, MD;  Location: [facility];  Service: Obstetrics;  Laterality: N/A;  . CHOLECYSTECTOMY  2007  . DILATION AND CURETTAGE OF UTERUS    . DILATION AND EVACUATION  05/05/2012   Procedure: DILATATION AND EVACUATION;  Surgeon: Alwyn Pea, MD;  Location: [facility];  Service: Gynecology;  Laterality: N/A;  . FOOT SURGERY  [id number],7106   x 2 right foot  . FOOT TENDON TRANSFER  2004  . [place]  2011  . HERNIA REPAIR  1984  . KNEE ARTHROSCOPY     x 3 - right knee    FAMILY HISTORY: Family History  Problem Relation Age of Onset  . Stroke Mother   . Kidney disease Mother        ESRD  . Multiple sclerosis Mother   . Depression Mother   . Breast cancer Mother 42  . Ovarian cancer Mother 26       maybe ut?  . Thyroid cancer Mother 55       reports it was fairly 'devastating, possibly aggresive one'  . Bone cancer Mother 75       unsure if a separate primary or met  .  Hypertension Father   . Diabetes Father        type II  . Sarcoidosis Father   . Asthma Daughter   . Hypertension Maternal Grandmother   . Diabetes Maternal Grandmother   . Bone cancer Maternal Grandmother 80  . Diabetes Paternal Grandfather   . Alzheimer's disease Paternal Grandfather   . Breast cancer Other 50  . Prostate cancer Maternal Uncle 65       no surgery  . Lung cancer Maternal Grandfather 79  . Leukemia Paternal Grandmother 41       died at 68  . Breast cancer Cousin 11    SOCIAL HISTORY:  Social  History   Socioeconomic History  . Marital status: Married    Spouse name: Not on file  . Number of children: Not on file  . Years of education: Not on file  . Highest education level: Not on file  Occupational History  . Not on file  Social Needs  . Financial resource strain: Not on file  . Food insecurity:    Worry: Not on file    Inability: Not on file  . Transportation needs:    Medical: Not on file    Non-medical: Not on file  Tobacco Use  . Smoking status: Never Smoker  . Smokeless tobacco: Never Used  Substance and Sexual Activity  . Alcohol use: Yes    Comment: occasional but none with pregnancy  . Drug use: No  . Sexual activity: Yes    Birth control/protection: None    Comment: preganat  Lifestyle  . Physical activity:    Days per week: Not on file    Minutes per session: Not on file  . Stress: Not on file  Relationships  . Social connections:    Talks on phone: Not on file    Gets together: Not on file    Attends religious service: Not on file    Active member of club or organization: Not on file    Attends meetings of clubs or organizations: Not on file    Relationship status: Not on file  . Intimate partner violence:    Fear of current or ex partner: Not on file    Emotionally abused: Not on file    Physically abused: Not on file    Forced sexual activity: Not on file  Other Topics Concern  . Not on file  Social History Narrative   Currently at [employer] for [facility]; wants to enroll in RN program.   Lives with boyfriend   Regular exercise: yes     PHYSICAL EXAM  Vitals:   04/02/18 0829  BP: 134/76  Pulse: 61  Resp: 18  Weight: 234 lb (106.1 kg)  Height: 5' 6"  (1.676 m)     Body mass index is 37.77 kg/m.   General: The patient is well-developed and well-nourished and in no acute distress  Neurologic Exam  Mental status: The patient is alert and oriented x 3 at the time of the examination. The patient has apparent normal  recent and remote memory, with an apparently normal attention span and concentration ability.   Speech is normal.  Cranial nerves:   Extraocular muscles are normal.  Facial strength and sensation is normal.  Trapezius strength is normal.  Palatal elevation and tongue protrusion are midline.  No obvious hearing deficits are noted.  Motor:  Muscle bulk is normal.   Tone is mildly increased in right leg. Strength is  5 / 5 in  the arms and left leg and proximal right leg and 4/5 right foot/ankle..   Sensory: Sensory testing is intact to touch and vibration sensation in the arms but reduced touch/temp in right foot, mostly in the lateral foot with more normal sensation medially and over the great toe  Coordination: She has good finger-nose-finger bilaterally.  Heel-to-shin is normal on the left and reduced on the right. Gait and station: Station is normal.   She has a right foot drop.  The tandem gait is wide.  Romberg is negative.  Reflexes: Deep tendon reflexes are symmetric and normal bilaterally.       DIAGNOSTIC DATA (LABS, IMAGING, TESTING) - I reviewed patient records, labs, notes, testing and imaging myself where available.  Lab Results  Component Value Date   WBC 2.6 (L) 08/29/2017   HGB 12.1 08/29/2017   HCT 37.9 08/29/2017   MCV 84 08/29/2017   PLT 227 08/29/2017      Component Value Date/Time   NA 140 08/29/2017 0858   K 4.5 08/29/2017 0858   CL 104 08/29/2017 0858   CO2 22 08/29/2017 0858   GLUCOSE 78 08/29/2017 0858   GLUCOSE 79 03/19/2017 1411   BUN 9 08/29/2017 0858   CREATININE 0.77 08/29/2017 0858   CALCIUM 8.9 08/29/2017 0858   PROT 7.0 03/19/2017 1411   PROT 6.9 01/14/2017 1438   ALBUMIN 4.1 03/19/2017 1411   ALBUMIN 4.3 01/14/2017 1438   AST 12 03/19/2017 1411   ALT 8 03/19/2017 1411   ALKPHOS 36 (L) 03/19/2017 1411   BILITOT 0.9 03/19/2017 1411   BILITOT 0.4 01/14/2017 1438   GFRNONAA 100 08/29/2017 0858   GFRAA 116 08/29/2017 0858   Lab Results   Component Value Date   CHOL 182 03/19/2017   HDL 59.20 03/19/2017   LDLCALC 110 (H) 03/19/2017   TRIG 65.0 03/19/2017   CHOLHDL 3 03/19/2017   Lab Results  Component Value Date   HGBA1C 5.2 03/19/2017   Lab Results  Component Value Date   VITAMINB12 330 09/27/2009   Lab Results  Component Value Date   TSH 0.539 08/29/2017       ASSESSMENT AND PLAN  Multiple sclerosis (HCC)  Gait disturbance  Numbness  Foot drop, right  High risk medication use   1.  Her MS appears to be stable after her second course of Lemtrada and she will continue with the REMS program for 4 more years.   2.   Although the symptoms in the right leg likely from her MS with ankle dorsiflexion more than plantar flexion weakness, the sensory exam is unusual for MS as there is much more numbness on the lateral aspect of the foot and the medial aspect.  We did check a nerve conduction study and EMG to determine if there is a superimposed mononeuropathy or radiculopathy. 3.   Stay active and exercise.   We discussed staying hydrated and trying to stay cool especially when she does activities like she did recently. 4.   Continue current medications.  Add gabapentin for dysesthesias.   Rtc 5-6 months, call sooner if new or worsening symptoms or other issues.    Treacy Holcomb A. Felecia Shelling, MD, PhD 21/30/8657, 8:46 AM Certified in Neurology, Clinical Neurophysiology, Sleep Medicine, Pain Medicine and Neuroimaging  [facility] [address], [place], [place] [address] [phone]

## 2018-04-06 ENCOUNTER — Other Ambulatory Visit: Payer: Self-pay | Admitting: Neurology

## 2018-04-24 ENCOUNTER — Other Ambulatory Visit: Payer: Self-pay | Admitting: Neurology

## 2018-04-26 ENCOUNTER — Other Ambulatory Visit: Payer: Self-pay | Admitting: Neurology

## 2018-04-29 ENCOUNTER — Encounter: Payer: Medicaid Other | Admitting: Neurology

## 2018-04-30 ENCOUNTER — Encounter: Payer: Self-pay | Admitting: Neurology

## 2018-05-05 ENCOUNTER — Encounter: Payer: Self-pay | Admitting: Neurology

## 2018-05-07 ENCOUNTER — Telehealth: Payer: Self-pay | Admitting: Neurology

## 2018-05-07 MED ORDER — LORAZEPAM 1 MG PO TABS: 1.0000 mg | ORAL_TABLET | Freq: Every day | ORAL | 2 refills | Status: DC

## 2018-05-07 NOTE — Addendum Note (Signed)
Addended by: Britt Bottom on: 05/07/2018 05:51 PM   Modules accepted: Orders

## 2018-05-07 NOTE — Telephone Encounter (Signed)
Lets have her take Ativan 1 mg nightly    #30   #2

## 2018-05-07 NOTE — Telephone Encounter (Signed)
Called and spoke with pt. Relayed Dr. Garth Bigness recommendation. She is agreeable to try this.  She would like rx [facility] [place] on file. She will call back if she has further questions/concerns.

## 2018-05-07 NOTE — Telephone Encounter (Signed)
Pt feels that possibly because of her taking gabapentin (NEURONTIN) 300 MG capsule She is having a terrible time going to sleep at night. Pt is having very vivid dreams causing her anxiety at night, pt is asking for a call to discuss

## 2018-05-07 NOTE — Addendum Note (Signed)
Addended by: Edison Pace, Jakita Dutkiewicz L on: 05/07/2018 02:00 PM   Modules accepted: Orders

## 2018-05-07 NOTE — Telephone Encounter (Signed)
I called patient back. Started gabapentin 04/02/2018 and stopped around [place]. She is still having trouble sleeping and having vivid dreams. This is a new sx for her. She has not started any other new medications. She is wanting to know Dr. Garth Bigness recommendations. Advised I will discuss with him and call back.

## 2018-05-07 NOTE — Telephone Encounter (Signed)
Dr. Sater- do you have any recommendations? 

## 2018-06-10 ENCOUNTER — Telehealth: Payer: Self-pay | Admitting: *Deleted

## 2018-06-10 ENCOUNTER — Encounter: Payer: Medicaid Other | Admitting: Neurology

## 2018-06-10 ENCOUNTER — Ambulatory Visit (INDEPENDENT_AMBULATORY_CARE_PROVIDER_SITE_OTHER): Payer: Medicaid Other | Admitting: Neurology

## 2018-06-10 DIAGNOSIS — Z0289 Encounter for other administrative examinations: Secondary | ICD-10-CM

## 2018-06-10 DIAGNOSIS — M21371 Foot drop, right foot: Secondary | ICD-10-CM

## 2018-06-10 DIAGNOSIS — R200 Anesthesia of skin: Secondary | ICD-10-CM | POA: Diagnosis not present

## 2018-06-10 NOTE — Telephone Encounter (Signed)
Faxed signed order to [employer] re: right AFO brace for right foot drop. Dx: [id number] [id number]. Necessary to help prevent falls. Fax: [phone]. Received fax confirmation.

## 2018-06-10 NOTE — Progress Notes (Signed)
Full Name: Jessica Roberson Gender: Female MRN #: [id number] Date of Birth: 1981-04-21    Visit Date: 06/10/2018 09:11 Age: 38 Years 60 Months Old Examining Physician: Arlice Colt, MD  Referring Physician: Arlice Colt, MD    History:  Jessica Roberson is a 38 year old woman who has multiple sclerosis who has had progressive weakness in the right leg.  She has had two operations involving the right foot and ankle in 2002.   She has a scar just lateral to the lower tibia on the right.   Strength was 1/5 in the right EHLan  in the right EDB and 4+/5 in the ankle extensors she had reduced sensation in the lateral leg and slight reduced sensation on the dorsum of the right foot   Nerve conduction studies: The left peroneal and bilateral tibial motor responses had normal distal latencies, amplitudes and conduction velocities.  The right peroneal motor response was unresponsive over the EDB.   Bilateral sural sensory responses in the left superficial peroneal sensory response had normal peak latency and amplitudes but there is superficial peroneal sensory response on the right was absent.  EMG: Needle EMG of selected muscles of the right leg and foot was performed.  There was no abnormal spontaneous activity.  Minimal chronic denervation was noted in the vastus medialis and gluteus medius muscles while the peroneus longus and tibialis anterior muscle had mild chronic denervation.  No voluntary units were seen in the EDB muscle.  Impression: This NCV/EMG study shows the following: 1.   The peroneal responses to the right foot were absent.  As there was just mild elevation in the more proximal peroneal innervated muscles, this could be related to her history of surgery. 2.    Possible mild right L5 chronic radiculopathy.  Jessica Roberson A. Felecia Shelling, MD, PhD, FAAN Certified in Neurology, Clinical Neurophysiology, Sleep Medicine, Pain Medicine and Neuroimaging Director, [facility]  [facility] [address] [place],  [address] [phone]       [facility]    Nerve / Sites Muscle Latency Ref. Amplitude Ref. Rel Amp Segments Distance Velocity Ref. Area    ms ms mV mV %  cm m/s m/s mVms  R Peroneal - EDB     Ankle EDB NR ?6.5 NR ?2.0 NR Ankle - EDB 9   NR     Fib head EDB NR  NR  NR Fib head - Ankle 30 NR ?44 NR  L Peroneal - EDB     Ankle EDB 4.9 ?6.5 5.8 ?2.0 100 Ankle - EDB 9   19.6     Fib head EDB 11.3  5.1  87.3 Fib head - Ankle 31 48 ?44 17.9     Pop fossa EDB 13.4  4.9  97.1 Pop fossa - Fib head 10 48 ?44 18.7         Pop fossa - Ankle      R Tibial - AH     Ankle AH 4.9 ?5.8 16.2 ?4.0 100 Ankle - AH 9   36.8     Pop fossa AH 13.9  12.3  76.1 Pop fossa - Ankle 40 44 ?41 31.2  L Tibial - AH     Ankle AH 4.7 ?5.8 15.2 ?4.0 100 Ankle - AH 9   43.3     Pop fossa AH 14.0  11.7  77.1 Pop fossa - Ankle 41 44 ?41 34.0  [place]    Nerve / Sites Rec. Site Peak Lat Ref.  Amp Ref. Segments Distance    ms ms V V  cm  R Sural - Ankle (Calf)     Calf Ankle 3.9 ?4.4 15 ?6 Calf - Ankle 14  L Sural - Ankle (Calf)     Calf Ankle 4.0 ?4.4 12 ?6 Calf - Ankle 14  R Superficial peroneal - Ankle     Lat leg Ankle NR ?4.4 NR ?6 Lat leg - Ankle 14  L Superficial peroneal - Ankle     Lat leg Ankle 4.1 ?4.4 7 ?6 Lat leg - Ankle 14             F  Wave    Nerve F Lat Ref.   ms ms  R Tibial - AH 50.9 ?56.0  L Tibial - AH 52.9 ?56.0         EMG full       EMG Summary Table    Spontaneous MUAP Recruitment  Muscle IA Fib PSW Fasc Other Amp Dur. Poly Pattern  R. Vastus medialis Normal None None None _______ Normal Normal 1+ Reduced  R. Peroneus longus Normal None None None _______ Normal Increased 1+ Reduced  R. Gastrocnemius (Medial head) Normal None None None _______ Normal Normal Normal Normal  R. Abductor hallucis Normal None None None _______ Normal Normal Normal Normal  R. Tibialis anterior  Normal None None None _______ Increased Increased 1+ Reduced  R. Gluteus medius Normal None None None _______ Normal Normal 1+ Normal  R. EDB         No units

## 2018-08-11 ENCOUNTER — Other Ambulatory Visit: Payer: Self-pay | Admitting: Neurology

## 2018-09-22 ENCOUNTER — Encounter: Payer: Self-pay | Admitting: Neurology

## 2018-09-22 ENCOUNTER — Ambulatory Visit (INDEPENDENT_AMBULATORY_CARE_PROVIDER_SITE_OTHER): Payer: Medicaid Other | Admitting: Neurology

## 2018-09-22 ENCOUNTER — Other Ambulatory Visit: Payer: Self-pay

## 2018-09-22 ENCOUNTER — Telehealth: Payer: Self-pay | Admitting: Neurology

## 2018-09-22 VITALS — BP 105/70 | HR 68 | Temp 98.4°F | Ht 66.0 in | Wt 238.5 lb

## 2018-09-22 DIAGNOSIS — G4486 Cervicogenic headache: Secondary | ICD-10-CM | POA: Insufficient documentation

## 2018-09-22 DIAGNOSIS — R200 Anesthesia of skin: Secondary | ICD-10-CM | POA: Diagnosis not present

## 2018-09-22 DIAGNOSIS — M542 Cervicalgia: Secondary | ICD-10-CM | POA: Insufficient documentation

## 2018-09-22 DIAGNOSIS — G35 Multiple sclerosis: Secondary | ICD-10-CM | POA: Diagnosis not present

## 2018-09-22 DIAGNOSIS — R51 Headache: Secondary | ICD-10-CM | POA: Diagnosis not present

## 2018-09-22 NOTE — Telephone Encounter (Signed)
I can see her today in the office

## 2018-09-22 NOTE — Telephone Encounter (Signed)
Called pt, scheduled work in visit for today at 330pm with Dr. Felecia Shelling. She passed covid-19 screening. Asked her to check in 300pm, explained new check in process. Updated med list, pharmacy, allergies on file.

## 2018-09-22 NOTE — Addendum Note (Signed)
Addended by: Edison Pace, EMMA L on: 09/22/2018 10:14 AM   Modules accepted: Orders

## 2018-09-22 NOTE — Progress Notes (Signed)
[facility]  PATIENT: Jessica Roberson DOB: [birth year]  REFERRING DOCTOR OR PCP:  Debbrah Alar SOURCE: patient  _________________________________   HISTORICAL  CHIEF COMPLAINT:  Chief Complaint  Patient presents with  . Follow-up    RM 12. Here today because she is having extreme muscle spasms which has caused severe headaches for the last 5 days  . Multiple Sclerosis    HISTORY OF PRESENT ILLNESS:  Jessica Roberson is a 38 y.o. woman with MS .   Update 09/22/2018: She feels her MS is stable ans has no recent exacerbation.   She had her second year of [place] and labs are fine.   She has had no issues.  MRIs in 12/2017 were stable.      She has had spasms in her right neck x 2 weeks that have progressed and is more constant.    She has right neck tenderness that radiates into the top of the head and also into the right shoulder.  When more intense she has a headache.  The right arm seems slightly weaker.   Her neck is a little stiff.  She denies numbness or weakness elsewhere.  Of note she is on the computer a lot more than she used to be.     She continues to have weakness in the right foot but it is better.   The numbness is also better.    She notes mild fatigue but it is not much of a problems and sleep is good.     Update 04/02/2018: She had her second year of Lemtrada and tolerated it well.    No infusion reactions or infections.     She is on Valtrex.    She is compliant with REMS.      She has some pain in her right leg which has been numb in the past due to her MS.    She has mild right leg weakness.  She is unable to lift the right toes up and right ankle dorsiflexion is weak.    Pain is worse if she walks longer distances.   Pain extends from the knee to the foot /toes.     There is always a low level pain and it intensifies at times.     She has no falls.   She can climb stairs easily.     Bladder is fine.   Vision is fine.    Fatigue is not  much of a problem,    She sleeps well.     Mood fluctuates and she is sometimes agitated, often if in more pain.     She is anxious at times.   Cognition is doing well.  Update 12/05/2017: Jessica Roberson is worked in today due to increased symptoms including fatigue, headaches, fatigue and myalgias that began over the weekend.  Of note, she was outdoors and overheated much at that time.   [place]   When she was ready to leave, she couldn't walk the distance back to the car and security helped her get back.    She felt better that night and the next day.  Then she went bowing Monday night and another outside event Monday afternoon at her kids school.   Since then, her fatigue is much worse.    Her left leg seemed slow (usually good leg).  She has a nagging headache and has arm and back pain.      Gait  is more sluggish and left leg seems to be dragging some (new).    No new numbness or dysesthesia.   Bladder is fine.   Vision is fine now (was mildly blurry on Sunday; no h/o ON).    Fatigue is worse.   Cognition is fine.    Mood is fine.   She has slept normally.     As a disease modifying therapy, she was switched to [place] and she had a 5-day course in early October 2018 and will be due for her 3-day course in a couple months.  She tolerated it fairly well.  She is compliant with the REMS program.  Update 08/29/2017: She had her 5 day course of Lemtrada.    She tolerated it well.  Next infusion will be a 3 day course on October  She feels her MS is stable.   No new exacerbations or symptoms.   She has old right lower leg numbness that has been present x years.    Now she has a burning dysesthesia and allodynia in her toes.   She has a right foot drop, worse with longer distances tha can affect gait some.    She has some stumbles but no falls.      She is on tizanidine for right leg spasticity.   Flexeril caused bad dreams and she stopped.    Bladder is doing well.    Vision is  fine.    She notes more fatigue as the temperature increases.  She is sleeping well most nights.    She feels she is more irritable but she denies depression.    She has mild anxiety at times.  This is not bad enough for her to want to treat.    Cognition is good.    From 11/05/2016:  MS:  She has been on Gilenya off and on for these 6 or 7 years. She stopped during pregnancy she has tolerated Gilenya well but has had abnormal lab values with low white blood cell and low lymphocyte count, even after changing to every other day. Although she has not had definite clinical exacerbations recently, the last MRI performed in a couple months ago showed a moderate size left frontal periventricular focus that develop during the interim   we had discussed that it appears as though the Gilenya is not optimal between the low white blood cell count and the changes on MRI. Options would include continuing on Gilenya or switching to one of the higher efficacy medications.   We have discussed Lemtrada, ocrelizumab and Tysabri. She was once on Tysabri and tolerated it well. When she stopped she was concerned about PML but she is JCV antibody negative.  Additionally, [facility] is of interest to her due to the dosing regimen.   She denies any new symptoms. She continues to have some right leg weakness and numbness. Tizanidine helps the spasticity some.    MS History:   She was diagnosed with multiple sclerosis in 2006 after presenting with left facial numbness and vision changes. MRIs of the brain were consistent with MS. She also had a lumbar puncture in the spinal fluid was consistent with MS. She initially saw a Dr. at [facility] neurologic and then began to see Dr. [name] at [facility].   She was placed on Betaseron but switched to Copaxone due to injection site reactions. Unfortunately, she also had injection site reactions on Copaxone. Around 2010, she was started on Tysabri.  Due to a concern about  PML, she wished to  switch therapy. She was screened for a drug study but could not get the MRI's due to braces. She started Gilenya in 2012. Done well on that therapy she stopped twice, once for insurance reasons and once for pregnancy and resumed therapy after each pause. She has not had any definite exacerbations while on Gilenya. She tolerates it very well. She had optic neuritis in 2017.   MRI 08/2016 showed a new focus not present in 2016.   REVIEW OF SYSTEMS: Constitutional: No fevers, chills, sweats, or change in appetite.  She notes a lot more fatigue. Eyes: see above Ear, nose and throat: No hearing loss, ear pain, nasal congestion, sore throat Cardiovascular: No chest pain, palpitations Respiratory: No shortness of breath at rest or with exertion.   No wheezes GastrointestinaI: No nausea, vomiting, diarrhea, abdominal pain, fecal incontinence Genitourinary: No dysuria, urinary retention or frequency.  No nocturia. Musculoskeletal:She reports neck pain and some muscle aches Integumentary: No rash, pruritus, skin lesions Neurological: as above Psychiatric: No depression at this time.  No anxiety Endocrine: No palpitations, diaphoresis, change in appetite, change in weigh or increased thirst Hematologic/Lymphatic: No anemia, purpura, petechiae. Allergic/Immunologic: No itchy/runny eyes, nasal congestion, recent allergic reactions, rashes  ALLERGIES: Allergies  Allergen Reactions  . Aspirin Anaphylaxis  . Penicillins Anaphylaxis  . Cyclobenzaprine Itching    Bad dreams  . Hydromorphone Hcl Itching  . Tramadol Hcl Hives    HOME MEDICATIONS:  Current Outpatient Medications:  .  Alemtuzumab (LEMTRADA) 12 MG/1.2ML SOLN, Inject into the vein., Disp: , Rfl:  .  cyclobenzaprine (FLEXERIL) 5 MG tablet, TAKE 1 TABLET BY MOUTH EVERY DAY AT BEDTIME (Patient taking differently: Take 5 mg by mouth as needed. ), Disp: 90 tablet, Rfl: 1 .  fluticasone (FLONASE) 50 MCG/ACT nasal spray, Place 2 sprays into  both nostrils daily., Disp: 16 g, Rfl: 6 .  gabapentin (NEURONTIN) 300 MG capsule, TAKE 1 CAPSULE BY MOUTH IN THE MORNING , 1 CAP INEVENING & 2 CAP AT BEDTIME (Patient not taking: Reported on 09/22/2018), Disp: 360 capsule, Rfl: 3 .  LORazepam (ATIVAN) 1 MG tablet, TAKE 1 TABLET (1 MG TOTAL) BY MOUTH AT BEDTIME., Disp: 30 tablet, Rfl: 2 .  tiZANidine (ZANAFLEX) 4 MG tablet, TAKE 1 TABLET BY MOUTH 3 TIMES A DAY FOR MUSCLE SPASMS, Disp: 270 tablet, Rfl: 3  Current Facility-Administered Medications:  .  meclizine (ANTIVERT) tablet 25 mg, 25 mg, Oral, TID PRN, Garvin Fila, MD  PAST MEDICAL HISTORY: Past Medical History:  Diagnosis Date  . Allergy   . Anal fissure   . Arthritis    right knee  . Colon polyp 2005  . Depression    resolved - situational  . Family history of breast cancer   . Family history of leukemia   . Family history of ovarian cancer   . Family history of prostate cancer   . Family history of thyroid cancer   . Migraine    history - otc med prn  . Multiple sclerosis ([place])   . Multiple sclerosis ([place])   . Neuromuscular disorder ([place])    Multiple sclerosis - no meds with 2014 pregnancy  . Obesity   . PCOS (polycystic ovarian syndrome)   . Pregnancy induced hypertension 03/2007   Resolved - Nordic with 2008 pregnancy   . Ulcer 09/2009  . Vision abnormalities     PAST SURGICAL HISTORY: Past Surgical History:  Procedure Laterality Date  . carpel tunnel surery Left   .  CESAREAN SECTION  2008   wh  . CESAREAN SECTION N/A 04/02/2013   Procedure: REPEAT CESAREAN SECTION;  Surgeon: Eldred Manges, MD;  Location: [facility];  Service: Obstetrics;  Laterality: N/A;  . CHOLECYSTECTOMY  2007  . DILATION AND CURETTAGE OF UTERUS    . DILATION AND EVACUATION  05/05/2012   Procedure: DILATATION AND EVACUATION;  Surgeon: Alwyn Pea, MD;  Location: [facility];  Service: Gynecology;  Laterality: N/A;  . FOOT SURGERY  0349,1791   x 2 right foot  . FOOT TENDON TRANSFER  2004  .  Aneta  2011  . HERNIA REPAIR  1984  . KNEE ARTHROSCOPY     x 3 - right knee    FAMILY HISTORY: Family History  Problem Relation Age of Onset  . Stroke Mother   . Kidney disease Mother        ESRD  . Multiple sclerosis Mother   . Depression Mother   . Breast cancer Mother 69  . Ovarian cancer Mother 72       maybe ut?  . Thyroid cancer Mother 55       reports it was fairly 'devastating, possibly aggresive one'  . Bone cancer Mother 99       unsure if a separate primary or met  . Hypertension Father   . Diabetes Father        type II  . Sarcoidosis Father   . Asthma Daughter   . Hypertension Maternal Grandmother   . Diabetes Maternal Grandmother   . Bone cancer Maternal Grandmother 52  . Diabetes Paternal Grandfather   . Alzheimer's disease Paternal Grandfather   . Breast cancer Other 50  . Prostate cancer Maternal Uncle 65       no surgery  . Lung cancer Maternal Grandfather 18  . Leukemia Paternal Grandmother 23       died at 35  . Breast cancer Cousin 35    SOCIAL HISTORY:  Social History   Socioeconomic History  . Marital status: Married    Spouse name: Not on file  . Number of children: Not on file  . Years of education: Not on file  . Highest education level: Not on file  Occupational History  . Not on file  Social Needs  . Financial resource strain: Not on file  . Food insecurity:    Worry: Not on file    Inability: Not on file  . Transportation needs:    Medical: Not on file    Non-medical: Not on file  Tobacco Use  . Smoking status: Never Smoker  . Smokeless tobacco: Never Used  Substance and Sexual Activity  . Alcohol use: Yes    Comment: occasional but none with pregnancy  . Drug use: No  . Sexual activity: Yes    Birth control/protection: None    Comment: preganat  Lifestyle  . Physical activity:    Days per week: Not on file    Minutes per session: Not on file  . Stress: Not on file  Relationships  . Social  connections:    Talks on phone: Not on file    Gets together: Not on file    Attends religious service: Not on file    Active member of club or organization: Not on file    Attends meetings of clubs or organizations: Not on file    Relationship status: Not on file  . Intimate partner violence:    Fear of current or  ex partner: Not on file    Emotionally abused: Not on file    Physically abused: Not on file    Forced sexual activity: Not on file  Other Topics Concern  . Not on file  Social History Narrative   Currently at [employer]; wants to enroll in RN program.   Lives with boyfriend   Regular exercise: yes     PHYSICAL EXAM  Vitals:   09/22/18 1529  BP: 105/70  Pulse: 68  Temp: 98.4 F (36.9 C)  Weight: 238 lb 8 oz (108.2 kg)  Height: '5\' 6"'$  (1.676 m)     Body mass index is 38.49 kg/m.   General: The patient is well-developed and well-nourished and in no acute distress.  Head and neck: The head is normocephalic and atraumatic.  The neck is tender over the right occiput, upper to mid cervical paraspinal muscles and trapezius muscle.  Range of motion is slightly reduced.  Neurologic Exam  Mental status: The patient is alert and oriented x 3 at the time of the examination. The patient has apparent normal recent and remote memory, with an apparently normal attention span and concentration ability.   Speech is normal.  Cranial nerves:   Extraocular muscles are normal.  Facial strength is normal.  Trapezius strength is normal.     No obvious hearing deficits are noted.  Motor:  Muscle bulk is normal.   Tone is mildly increased in right leg. Strength is  5 / 5 in the arms and left leg and proximal right leg and 4/5 right foot/ankle..   Sensory: Sensory testing is intact proximally in the arms and legs  Coordination: She has good finger-nose-finger bilaterally.  Heel-to-shin is normal on the left and reduced on the right.  Gait and station:  Station is normal.   She has a mild right foot drop.  The tandem gait is wide.  Romberg is negative.  Reflexes: Deep tendon reflexes are symmetric and normal bilaterally.       DIAGNOSTIC DATA (LABS, IMAGING, TESTING) - I reviewed patient records, labs, notes, testing and imaging myself where available.  Lab Results  Component Value Date   WBC 2.6 (L) 08/29/2017   HGB 12.1 08/29/2017   HCT 37.9 08/29/2017   MCV 84 08/29/2017   PLT 227 08/29/2017      Component Value Date/Time   NA 140 08/29/2017 0858   K 4.5 08/29/2017 0858   CL 104 08/29/2017 0858   CO2 22 08/29/2017 0858   GLUCOSE 78 08/29/2017 0858   GLUCOSE 79 03/19/2017 1411   BUN 9 08/29/2017 0858   CREATININE 0.77 08/29/2017 0858   CALCIUM 8.9 08/29/2017 0858   PROT 7.0 03/19/2017 1411   PROT 6.9 01/14/2017 1438   ALBUMIN 4.1 03/19/2017 1411   ALBUMIN 4.3 01/14/2017 1438   AST 12 03/19/2017 1411   ALT 8 03/19/2017 1411   ALKPHOS 36 (L) 03/19/2017 1411   BILITOT 0.9 03/19/2017 1411   BILITOT 0.4 01/14/2017 1438   GFRNONAA 100 08/29/2017 0858   GFRAA 116 08/29/2017 0858   Lab Results  Component Value Date   CHOL 182 03/19/2017   HDL 59.20 03/19/2017   LDLCALC 110 (H) 03/19/2017   TRIG 65.0 03/19/2017   CHOLHDL 3 03/19/2017   Lab Results  Component Value Date   HGBA1C 5.2 03/19/2017  DVT Lab Results  Component Value Date   VITAMINB12 330 09/27/2009   Lab Results  Component Value Date   TSH 0.539 08/29/2017  ASSESSMENT AND PLAN  Multiple sclerosis (HCC)  Numbness  Neck pain  Cervicogenic headache   1.  Continue with the REMS program for 3+ more years or Lemtrada   2.   Trigger point inject the right splenius capitus, splenius cervicus, C3C4 paraspinal and trapezius muscles with 80 mg Depo-Medrol in [place] using sterile technique.   3.   Stay active and exercise.   We discussed ergonomic position of her computer. 4.   Continue current medications.   5.   Rtc 5-6 months, call  sooner if new or worsening symptoms or other issues.    Taylen Osorto A. Felecia Shelling, MD, PhD 07/29/8889, 6:94 PM Certified in Neurology, Clinical Neurophysiology, Sleep Medicine, Pain Medicine and Neuroimaging  [facility] [address], [place], [place] [address] [phone]

## 2018-09-22 NOTE — Telephone Encounter (Signed)
Dr. Sater- what would you recommend? 

## 2018-09-22 NOTE — Telephone Encounter (Signed)
Pt has called to discuss the very bad muscle spasms she has been having which has led to exteme headaches for about 5 days.  Pt was told of Dr Garth Bigness 1st available being 07-01, she asked this message be sent and RN calls her to discuss her headaches.

## 2018-09-25 ENCOUNTER — Encounter: Payer: Self-pay | Admitting: Neurology

## 2018-10-02 ENCOUNTER — Ambulatory Visit: Payer: Medicaid Other | Admitting: Neurology

## 2018-10-13 ENCOUNTER — Telehealth: Payer: Self-pay | Admitting: Neurology

## 2018-10-13 NOTE — Telephone Encounter (Signed)
The patient had her second year Lemtrada infusions in December 2019.  Reports noticing dark spots on her bilateral lower legs below her knees.  No swelling. Increased fatigue but she said this may be from the heat.  She first noticed the spots on 10/10/2018.  She would like to be advised on further evaluation of this problem.

## 2018-10-13 NOTE — Telephone Encounter (Signed)
Pt states she was told to call us if she notice any dark spots in her lower half due to the Alemtuzumab (LEMTRADA) 12 MG/1.2ML SOLN She has noticed them and would like to be advised.

## 2018-10-14 NOTE — Telephone Encounter (Signed)
I have spoken with the patient to provide her with the MD response below.  She is agreeable to monitor symptoms and seek physical evaluation with her PCP and/or dermotology if needed.  I have also asked that she call our office back, if she notices any further signs of worsening, so that we can share this information with Dr. Felecia Shelling.

## 2018-10-14 NOTE — Telephone Encounter (Signed)
There was no mention of skin rash at [place] insert, and spots appeared 6 months after her 2nd infusion, less likely related to [facility], may advise her continue to observe her symptoms, if worsen, may consider talk to her PCP or dermatologist.

## 2018-10-27 ENCOUNTER — Telehealth: Payer: Self-pay

## 2018-10-27 NOTE — Telephone Encounter (Signed)
Pt called to report the company([employer]) she gets her monthly Lemtrada labs unexpectedly shut down on 10/24/18. Pt states her labs are due tomorrow and wanted to know if she could come to [facility] for lab draw? Pt's best call back # is [phone].

## 2018-10-27 NOTE — Telephone Encounter (Signed)
Called pt back. Advised I called and spoke with Junie Panning who helps coordinate Lemtrada patient's for [place]. Our [facility] at our office is not a freestanding [facility]. For labs to still be covered, she has to go to a freestanding location. I looked online and the closest one to her address is [address], [place] [address]. Their phone number is: [phone].Advised we will fax orders that will be good for 6 months. She will plan on going tomorrow for labs still and will call if she has any further issues.

## 2018-10-27 NOTE — Telephone Encounter (Signed)
Faxed signed orders to [phone]. Orders included: hematology, creatinine, serum, UA, TSH (monthly). Received fax confirmation

## 2018-10-27 NOTE — Telephone Encounter (Signed)
I called [facility]) and she verified [employer] shut down on 10/24/18. Drug companies did not have heads up about shut down. They are working on finding another phlebotomy program to do home lab draws. [facility] will call with updates once they know more.

## 2018-11-05 NOTE — Telephone Encounter (Signed)
Called in and stated [facility] is stating they dont have orders and want to know if they can be re-faxed

## 2018-11-05 NOTE — Telephone Encounter (Signed)
Re-faxed orders to [phone]. Receive fax confirmation.

## 2019-01-05 ENCOUNTER — Encounter: Payer: Self-pay | Admitting: Neurology

## 2019-03-09 ENCOUNTER — Telehealth: Payer: Self-pay | Admitting: Neurology

## 2019-03-09 NOTE — Telephone Encounter (Signed)
I reached out to the pt and advised on note about using [facility] for home lab draws.  Pt was appreciative. Pt was also give the # at [facility] to call if she has not heard from the in about 1 week. Pt verbalized understanding and had no further questions at this time.

## 2019-03-09 NOTE — Telephone Encounter (Signed)
Patient called wanting to know if she could be referred to the new company for [facility] where they would do at home blood draws for the patient (patient is unsure of the new company name) for her monthly blood draws. Patient states it would be better for her to get referred there versus having to go to [facility] every month. Please follow up.

## 2019-03-09 NOTE — Telephone Encounter (Signed)
New orders will be faxed to [facility] today.  Also, an email sent to Tanda Rockers, RN (patient coordinator for [facility]) notifying her that this patient will need home lab draws.  Pt REMS [id number].  Orders faxed and confirmed to [phone].   Pt info: Number to reach [facility] is : [phone] - this is the [facility] line. When you call this line you will need to identify yourself as a "[facility] patient" so you are routed to the correct department.

## 2019-03-25 ENCOUNTER — Ambulatory Visit: Payer: Medicaid Other | Admitting: Neurology

## 2019-04-28 ENCOUNTER — Ambulatory Visit: Payer: Medicaid Other | Admitting: Neurology

## 2019-05-04 ENCOUNTER — Encounter: Payer: Self-pay | Admitting: Neurology

## 2019-05-28 ENCOUNTER — Encounter: Payer: Self-pay | Admitting: Neurology

## 2019-06-13 ENCOUNTER — Encounter (HOSPITAL_BASED_OUTPATIENT_CLINIC_OR_DEPARTMENT_OTHER): Payer: Self-pay | Admitting: *Deleted

## 2019-06-13 ENCOUNTER — Emergency Department (HOSPITAL_BASED_OUTPATIENT_CLINIC_OR_DEPARTMENT_OTHER)
Admission: EM | Admit: 2019-06-13 | Discharge: 2019-06-13 | Disposition: A | Discharge status: Home / Self Care | Payer: BLUE CROSS/BLUE SHIELD | Attending: Emergency Medicine | Admitting: Emergency Medicine

## 2019-06-13 ENCOUNTER — Other Ambulatory Visit: Payer: Self-pay

## 2019-06-13 DIAGNOSIS — Z880 Allergy status to penicillin: Secondary | ICD-10-CM | POA: Diagnosis not present

## 2019-06-13 DIAGNOSIS — Z79899 Other long term (current) drug therapy: Secondary | ICD-10-CM | POA: Insufficient documentation

## 2019-06-13 DIAGNOSIS — Z886 Allergy status to analgesic agent status: Secondary | ICD-10-CM | POA: Insufficient documentation

## 2019-06-13 DIAGNOSIS — Z885 Allergy status to narcotic agent status: Secondary | ICD-10-CM | POA: Insufficient documentation

## 2019-06-13 DIAGNOSIS — R0602 Shortness of breath: Secondary | ICD-10-CM | POA: Diagnosis present

## 2019-06-13 DIAGNOSIS — U071 COVID-19: Secondary | ICD-10-CM | POA: Insufficient documentation

## 2019-06-13 DIAGNOSIS — G35 Multiple sclerosis: Secondary | ICD-10-CM | POA: Insufficient documentation

## 2019-06-13 MED ORDER — ONDANSETRON 4 MG PO TBDP
4.0000 mg | ORAL_TABLET | Freq: Once | ORAL | Status: AC
  Administered 2019-06-13: 4 mg via ORAL
  Filled 2019-06-13: qty 1

## 2019-06-13 MED ORDER — ONDANSETRON HCL 4 MG PO TABS: 4.0000 mg | ORAL_TABLET | Freq: Three times a day (TID) | ORAL | 0 refills | Status: DC | PRN

## 2019-06-13 MED ORDER — KETOROLAC TROMETHAMINE 60 MG/2ML IM SOLN
30.0000 mg | Freq: Once | INTRAMUSCULAR | Status: AC
  Administered 2019-06-13: 30 mg via INTRAMUSCULAR
  Filled 2019-06-13: qty 2

## 2019-06-13 NOTE — ED Notes (Signed)
C/O since this morning, states symptoms started yesterday but worsening today, c/o HA and fatigue and nausea, denies fever, chest pain

## 2019-06-13 NOTE — ED Triage Notes (Signed)
COVID positive. Symptoms started 1 week ago. Reports weakness, HA and SOB continue. Last tylenol at 1pm today.

## 2019-06-13 NOTE — ED Provider Notes (Signed)
[facility] EMERGENCY DEPARTMENT Provider Note   CSN: [id number] Arrival date & time: 06/13/19  1533     History Chief Complaint  Patient presents with  . Shortness of Breath    Jessica Roberson is a 39 y.o. female.  The history is provided by the patient.  Shortness of Breath Severity:  Mild Onset quality:  Gradual Duration:  8 days Timing:  Intermittent Progression:  Waxing and waning Chronicity:  New Context: URI (covid)   Relieved by:  Nothing Worsened by:  Nothing Associated symptoms: no abdominal pain, no chest pain, no claudication, no cough, no fever and no wheezing        Past Medical History:  Diagnosis Date  . Allergy   . Anal fissure   . Arthritis    right knee  . Colon polyp 2005  . Depression    resolved - situational  . Family history of breast cancer   . Family history of leukemia   . Family history of ovarian cancer   . Family history of prostate cancer   . Family history of thyroid cancer   . Migraine    history - otc med prn  . Multiple sclerosis ([place])   . Multiple sclerosis ([place])   . Neuromuscular disorder ([place])    Multiple sclerosis - no meds with 2014 pregnancy  . Obesity   . PCOS (polycystic ovarian syndrome)   . Pregnancy induced hypertension 03/2007   Resolved - [place] with 2008 pregnancy   . Ulcer 09/2009  . Vision abnormalities     Patient Active Problem List   Diagnosis Date Noted  . Neck pain 09/22/2018  . Cervicogenic headache 09/22/2018  . Genetic testing 05/16/2017  . Family history of breast cancer   . Family history of leukemia   . Family history of thyroid cancer   . Family history of prostate cancer   . Family history of breast cancer in first degree relative 03/19/2017  . Family history of ovarian cancer 03/19/2017  . Family history of uterine cancer 03/19/2017  . Family history of stomach cancer 03/19/2017  . Pre-syncope 08/21/2016  . Other fatigue 03/01/2016  . Vitamin D deficiency 08/30/2015  .  Foot drop, right 08/30/2015  . Optic neuritis 03/23/2015  . Gait disturbance 09/27/2014  . Numbness 09/27/2014  . High risk medication use 09/27/2014  . Carpal tunnel syndrome 10/02/2013  . Ganglion cyst of wrist 10/02/2013  . Hand paresthesia 10/02/2013  . Knee pain 08/11/2013  . Morbid obesity ([place]) 04/02/2013  . Previous cesarean section 04/02/2013  . Sterilization 04/02/2013  . Pregnancy 04/02/2013  . Encounter for postoperative care 04/02/2013  . POLYCYSTIC OVARIAN DISEASE 06/05/2010  . Sinusitis, chronic 06/02/2010  . HIRSUTISM 05/12/2010  . ANXIETY 03/31/2010  . HYPERSOMNIA, ASSOCIATED WITH SLEEP APNEA 12/20/2009  . HYPERGLYCEMIA 12/09/2009  . GENITAL HERPES 11/23/2009  . HERPES SIMPLEX INFECTION, TYPE I 11/23/2009  . VITAMIN D DEFICIENCY 11/23/2009  . GERD 11/08/2009  . DEPRESSION, MILD 10/31/2009  . Multiple sclerosis ([place]) 10/31/2009  . MIGRAINES, HX OF 10/31/2009  . MORBID OBESITY 09/27/2009  . PERSONAL HX COLONIC POLYPS 09/27/2009  . Abdominal pain 09/16/2009  . Allergic rhinitis 11/29/2008  . Arthropathia 11/16/2008  . Common wart 11/04/2008  . Adiposity 10/26/2008    Past Surgical History:  Procedure Laterality Date  . carpel tunnel surery Left   . CESAREAN SECTION  2008   wh  . CESAREAN SECTION N/A 04/02/2013   Procedure: REPEAT CESAREAN SECTION;  Surgeon: Lorriane Shire  Midge Minium, MD;  Location: [facility];  Service: Obstetrics;  Laterality: N/A;  . CHOLECYSTECTOMY  2007  . DILATION AND CURETTAGE OF UTERUS    . DILATION AND EVACUATION  05/05/2012   Procedure: DILATATION AND EVACUATION;  Surgeon: Alwyn Pea, MD;  Location: [facility];  Service: Gynecology;  Laterality: N/A;  . FOOT SURGERY  [id number]   x 2 right foot  . FOOT TENDON TRANSFER  2004  . [place]  2011  . HERNIA REPAIR  1984  . KNEE ARTHROSCOPY     x 3 - right knee     OB History    Gravida  5   Para  2   Term  2   Preterm  0   AB  3   Living  2     SAB  2   TAB  1    Ectopic  0   Multiple  0   Live Births  2           Family History  Problem Relation Age of Onset  . Stroke Mother   . Kidney disease Mother        ESRD  . Multiple sclerosis Mother   . Depression Mother   . Breast cancer Mother 44  . Ovarian cancer Mother 76       maybe ut?  . Thyroid cancer Mother 65       reports it was fairly 'devastating, possibly aggresive one'  . Bone cancer Mother 21       unsure if a separate primary or met  . Hypertension Father   . Diabetes Father        type II  . Sarcoidosis Father   . Asthma Daughter   . Hypertension Maternal Grandmother   . Diabetes Maternal Grandmother   . Bone cancer Maternal Grandmother 11  . Diabetes Paternal Grandfather   . Alzheimer's disease Paternal Grandfather   . Breast cancer Other 50  . Prostate cancer Maternal Uncle 65       no surgery  . Lung cancer Maternal Grandfather 46  . Leukemia Paternal Grandmother 22       died at 54  . Breast cancer Cousin 3    Social History   Tobacco Use  . Smoking status: Never Smoker  . Smokeless tobacco: Never Used  Substance Use Topics  . Alcohol use: Yes    Comment: occasional but none with pregnancy  . Drug use: No    Home Medications Prior to Admission medications   Medication Sig Start Date End Date Taking? Authorizing Provider  Alemtuzumab (LEMTRADA) 12 MG/1.2ML SOLN Inject into the vein.    [provider]  cyclobenzaprine (FLEXERIL) 5 MG tablet TAKE 1 TABLET BY MOUTH EVERY DAY AT BEDTIME Patient taking differently: Take 5 mg by mouth as needed.  09/30/17   Sater, Nanine Means, MD  fluticasone (FLONASE) 50 MCG/ACT nasal spray Place 2 sprays into both nostrils daily. 04/09/17   Nche, Charlene Brooke, NP  gabapentin (NEURONTIN) 300 MG capsule TAKE 1 CAPSULE BY MOUTH IN THE MORNING , 1 CAP INEVENING & 2 CAP AT BEDTIME Patient not taking: Reported on 09/22/2018 04/24/18   Sater, Nanine Means, MD  LORazepam (ATIVAN) 1 MG tablet TAKE 1 TABLET (1 MG TOTAL) BY  MOUTH AT BEDTIME. 08/11/18   Sater, Nanine Means, MD  ondansetron (ZOFRAN) 4 MG tablet Take 1 tablet (4 mg total) by mouth every 8 (eight) hours as needed for up to 15  doses for nausea or vomiting. 06/13/19   Gage Weant, DO  tiZANidine (ZANAFLEX) 4 MG tablet TAKE 1 TABLET BY MOUTH 3 TIMES A DAY FOR MUSCLE SPASMS 04/28/18   Sater, Nanine Means, MD  omeprazole-sodium bicarbonate (ZEGERID) 40-1100 MG per capsule Take 1 capsule by mouth daily. 10/31/10 07/18/11  Debbrah Alar, NP    Allergies    Aspirin, Penicillins, Cyclobenzaprine, Hydromorphone hcl, and Tramadol hcl  Review of Systems   Review of Systems  Constitutional: Negative for fever.  Respiratory: Positive for shortness of breath. Negative for cough and wheezing.   Cardiovascular: Negative for chest pain and claudication.  Gastrointestinal: Negative for abdominal pain.    Physical Exam Updated Vital Signs BP 118/79 (BP Location: Left Arm)   Pulse 67   Temp 98.7 F (37.1 C) (Oral)   Resp 20   Ht _0  (1.753 m)   Wt 108.9 kg   SpO2 97%   BMI 35.44 kg/m   Physical Exam Vitals and nursing note reviewed.  Constitutional:      General: She is not in acute distress.    Appearance: She is well-developed. She is not ill-appearing.  HENT:     Head: Normocephalic and atraumatic.  Eyes:     Conjunctiva/sclera: Conjunctivae normal.     Pupils: Pupils are equal, round, and reactive to light.  Cardiovascular:     Rate and Rhythm: Normal rate and regular rhythm.     Pulses: Normal pulses.     Heart sounds: Normal heart sounds. No murmur.  Pulmonary:     Effort: Pulmonary effort is normal. No respiratory distress.     Breath sounds: Normal breath sounds. No decreased breath sounds, wheezing, rhonchi or rales.  Abdominal:     Palpations: Abdomen is soft.     Tenderness: There is no abdominal tenderness.  Musculoskeletal:     Cervical back: Normal range of motion and neck supple.  Skin:    General: Skin is warm and dry.      Capillary Refill: Capillary refill takes less than 2 seconds.  Neurological:     General: No focal deficit present.     Mental Status: She is alert.     ED Results / Procedures / Treatments   Labs (all labs ordered are listed, but only abnormal results are displayed) Labs Reviewed - No data to display  EKG None  Radiology No results found.  Procedures Procedures (including critical care time)  Medications Ordered in ED Medications  ketorolac (TORADOL) injection 30 mg (30 mg Intramuscular Given 06/13/19 1603)  ondansetron (ZOFRAN-ODT) disintegrating tablet 4 mg (4 mg Oral Given 06/13/19 1604)    ED Course  I have reviewed the triage vital signs and the nursing notes.  Pertinent labs & imaging results that were available during my care of the patient were reviewed by me and considered in my medical decision making (see chart for details).    MDM Rules/Calculators/A&P                      Jessica Roberson is a 39 year old female with history of MS who presents to the ED with shortness of breath, fatigue.  Patient with normal vitals.  Normal room air oxygenation and oxygenation with ambulation.  Patient diagnosed with coronavirus last week.  Does not have any severe respiratory symptoms.  Clear breath sounds on exam.  No signs of respiratory distress.  Says she has been nauseous and had poor appetite.  But denies any abdominal pain,  diarrhea, vomiting.  She has been taking Tylenol and Motrin with good relief.  Will prescribe Zofran for further symptomatic relief.  Gave patient a shot of Toradol for further myalgia relief.  Have given her information for infusion clinic for possible antibody treatment outpatient.  Discharged to the ED in good condition.  Given return precautions.  This chart was dictated using voice recognition software.  Despite best efforts to proofread,  errors can occur which can change the documentation meaning.  Jessica Roberson was evaluated in Emergency  Department on 06/13/2019 for the symptoms described in the history of present illness. She was evaluated in the context of the global COVID-19 pandemic, which necessitated consideration that the patient might be at risk for infection with the SARS-CoV-2 virus that causes COVID-19. Institutional protocols and algorithms that pertain to the evaluation of patients at risk for COVID-19 are in a state of rapid change based on information released by regulatory bodies including the CDC and federal and state organizations. These policies and algorithms were followed during the patient's care in the ED.  Final Clinical Impression(s) / ED Diagnoses Final diagnoses:  [id number]    Rx / DC Orders ED Discharge Orders         Ordered    ondansetron (ZOFRAN) 4 MG tablet  Every 8 hours PRN     06/13/19 1606           Kasean Denherder, Quita Skye, DO 06/13/19 1609

## 2019-06-15 ENCOUNTER — Telehealth: Payer: Self-pay

## 2019-06-15 NOTE — Telephone Encounter (Signed)
I left pt vm to call the office we wanted to see how she is doing [facility]

## 2019-06-30 ENCOUNTER — Encounter: Payer: Self-pay | Admitting: Neurology

## 2019-07-01 ENCOUNTER — Other Ambulatory Visit: Payer: Self-pay

## 2019-07-01 ENCOUNTER — Encounter: Payer: Self-pay | Admitting: Neurology

## 2019-07-01 ENCOUNTER — Ambulatory Visit (INDEPENDENT_AMBULATORY_CARE_PROVIDER_SITE_OTHER): Payer: BLUE CROSS/BLUE SHIELD | Admitting: Neurology

## 2019-07-01 VITALS — BP 117/80 | HR 65 | Temp 96.2°F | Ht 69.0 in | Wt 251.5 lb

## 2019-07-01 DIAGNOSIS — Z79899 Other long term (current) drug therapy: Secondary | ICD-10-CM | POA: Diagnosis not present

## 2019-07-01 DIAGNOSIS — R5383 Other fatigue: Secondary | ICD-10-CM | POA: Diagnosis not present

## 2019-07-01 DIAGNOSIS — G4700 Insomnia, unspecified: Secondary | ICD-10-CM

## 2019-07-01 DIAGNOSIS — G35 Multiple sclerosis: Secondary | ICD-10-CM

## 2019-07-01 DIAGNOSIS — M791 Myalgia, unspecified site: Secondary | ICD-10-CM

## 2019-07-01 DIAGNOSIS — U071 COVID-19: Secondary | ICD-10-CM

## 2019-07-01 MED ORDER — METHYLPREDNISOLONE 4 MG PO TABS: ORAL_TABLET | ORAL | 0 refills | Status: DC

## 2019-07-01 MED ORDER — LORAZEPAM 1 MG PO TABS: 1.0000 mg | ORAL_TABLET | Freq: Every evening | ORAL | 0 refills | Status: DC | PRN

## 2019-07-01 NOTE — Progress Notes (Signed)
[facility]  PATIENT: Jessica Roberson DOB: Dec 27, 1980  REFERRING DOCTOR OR PCP:  Debbrah Alar SOURCE: patient  _________________________________   HISTORICAL  CHIEF COMPLAINT:  Chief Complaint  Patient presents with  . Follow-up    RM 13, alone. Last seen 09/22/2018. Had covid-19 in february. Feels MS sx has worsened since.   . Multiple Sclerosis    On Lemtrada. 1st yr: 10/01-10/08/2016. 2nd yr: 01/2018. She had monthly labs completed yesterday. results not available yet.     HISTORY OF PRESENT ILLNESS:  Jessica Roberson is a 39 y.o. woman with MS .   Update 07/01/2019: She feels the MS is stable with no exacerbation.    She had Lemtrada in October 2018 and October 2019 and continues with the REMS program.   She feels gait and strength are doing ok.  She has more muscle spasticity and more tingling in her shoulder.  Pain radiates into her legs at times.    She had Covid early February 2021.   She was never hospitalized but felt very poor x 2 weeks.   She did go to the ED the one day when she had more respiratory symptoms and more nausea/vomiting.   She is still feeling very fatigued and has had a low grade headache and myalgias.   Ibuprofen only helps the myalgias for a couple hours and then pain returns.   She is in grad school and she is having trouble struggling.   She is resting more the past few weeks.   She is sleeping worse due to the myalgias.   Flexeril at bedtime helps a little bit.     Update 09/22/2018: She feels her MS is stable ans has no recent exacerbation.   She had her second year of [place] and labs are fine.   She has had no issues.  MRIs in 12/2017 were stable.      She has had spasms in her right neck x 2 weeks that have progressed and is more constant.    She has right neck tenderness that radiates into the top of the head and also into the right shoulder.  When more intense she has a headache.  The right arm seems slightly weaker.    Her neck is a little stiff.  She denies numbness or weakness elsewhere.  Of note she is on the computer a lot more than she used to be.     She continues to have weakness in the right foot but it is better.   The numbness is also better.    She notes mild fatigue but it is not much of a problems and sleep is good.     Update 04/02/2018: She had her second year of Lemtrada and tolerated it well.    No infusion reactions or infections.     She is on Valtrex.    She is compliant with REMS.      She has some pain in her right leg which has been numb in the past due to her MS.    She has mild right leg weakness.  She is unable to lift the right toes up and right ankle dorsiflexion is weak.    Pain is worse if she walks longer distances.   Pain extends from the knee to the foot /toes.     There is always a low level pain and it intensifies at times.     She has no falls.   She can climb stairs  easily.     Bladder is fine.   Vision is fine.    Fatigue is not much of a problem,    She sleeps well.     Mood fluctuates and she is sometimes agitated, often if in more pain.     She is anxious at times.   Cognition is doing well.  Update 12/05/2017: Ms. Gordan is worked in today due to increased symptoms including fatigue, headaches, fatigue and myalgias that began over the weekend.  Of note, she was outdoors and overheated much at that time.   [place]   When she was ready to leave, she couldn't walk the distance back to the car and security helped her get back.    She felt better that night and the next day.  Then she went bowing Monday night and another outside event Monday afternoon at her kids school.   Since then, her fatigue is much worse.    Her left leg seemed slow (usually good leg).  She has a nagging headache and has arm and back pain.      Gait is more sluggish and left leg seems to be dragging some (new).    No new numbness or dysesthesia.   Bladder is fine.   Vision is fine  now (was mildly blurry on Sunday; no h/o ON).    Fatigue is worse.   Cognition is fine.    Mood is fine.   She has slept normally.     As a disease modifying therapy, she was switched to [place] and she had a 5-day course in early October 2018 and will be due for her 3-day course in a couple months.  She tolerated it fairly well.  She is compliant with the REMS program.  Update 08/29/2017: She had her 5 day course of Lemtrada.    She tolerated it well.  Next infusion will be a 3 day course on October  She feels her MS is stable.   No new exacerbations or symptoms.   She has old right lower leg numbness that has been present x years.    Now she has a burning dysesthesia and allodynia in her toes.   She has a right foot drop, worse with longer distances tha can affect gait some.    She has some stumbles but no falls.      She is on tizanidine for right leg spasticity.   Flexeril caused bad dreams and she stopped.    Bladder is doing well.    Vision is fine.    She notes more fatigue as the temperature increases.  She is sleeping well most nights.    She feels she is more irritable but she denies depression.    She has mild anxiety at times.  This is not bad enough for her to want to treat.    Cognition is good.    From 11/05/2016:  MS:  She has been on Gilenya off and on for these 6 or 7 years. She stopped during pregnancy she has tolerated Gilenya well but has had abnormal lab values with low white blood cell and low lymphocyte count, even after changing to every other day. Although she has not had definite clinical exacerbations recently, the last MRI performed in a couple months ago showed a moderate size left frontal periventricular focus that develop during the interim   we had discussed that it appears as though the Gilenya is not  optimal between the low white blood cell count and the changes on MRI. Options would include continuing on Gilenya or switching to one of the higher efficacy medications.   We  have discussed Lemtrada, ocrelizumab and Tysabri. She was once on Tysabri and tolerated it well. When she stopped she was concerned about PML but she is JCV antibody negative.  Additionally, Holland Falling is of interest to her due to the dosing regimen.   She denies any new symptoms. She continues to have some right leg weakness and numbness. Tizanidine helps the spasticity some.    MS History:   She was diagnosed with multiple sclerosis in 2006 after presenting with left facial numbness and vision changes. MRIs of the brain were consistent with MS. She also had a lumbar puncture in the spinal fluid was consistent with MS. She initially saw a Dr. at [facility] neurolog[facility] and then began to see Dr. [name] at [facility].   She was placed on Betaseron but switched to Copaxone due to injection site reactions. Unfortunately, she also had injection site reactions on Copaxone. Around 2010, she was started on Tysabri.  Due to a concern about PML, she wished to switch therapy. She was screened for a drug study but could not get the MRI's due to braces. She started Gilenya in 2012. Done well on that therapy she stopped twice, once for insurance reasons and once for pregnancy and resumed therapy after each pause. She has not had any definite exacerbations while on Gilenya. She tolerates it very well. She had optic neuritis in 2017.   MRI 08/2016 showed a new focus not present in 2016.   REVIEW OF SYSTEMS: Constitutional: No fevers, chills, sweats, or change in appetite.  She notes a lot more fatigue. Eyes: see above Ear, nose and throat: No hearing loss, ear pain, nasal congestion, sore throat Cardiovascular: No chest pain, palpitations Respiratory: No shortness of breath at rest or with exertion.   No wheezes GastrointestinaI: No nausea, vomiting, diarrhea, abdominal pain, fecal incontinence Genitourinary: No dysuria, urinary retention or frequency.  No nocturia. Musculoskeletal:She reports neck pain and some  muscle aches Integumentary: No rash, pruritus, skin lesions Neurological: as above Psychiatric: No depression at this time.  No anxiety Endocrine: No palpitations, diaphoresis, change in appetite, change in weigh or increased thirst Hematologic/Lymphatic: No anemia, purpura, petechiae. Allergic/Immunologic: No itchy/runny eyes, nasal congestion, recent allergic reactions, rashes  ALLERGIES: Allergies  Allergen Reactions  . Aspirin Anaphylaxis  . Penicillins Anaphylaxis  . Cyclobenzaprine Itching    Bad dreams  . Hydromorphone Hcl Itching  . Tramadol Hcl Hives    HOME MEDICATIONS:  Current Outpatient Medications:  .  Alemtuzumab (LEMTRADA) 12 MG/1.2ML SOLN, Inject into the vein., Disp: , Rfl:  .  cyclobenzaprine (FLEXERIL) 5 MG tablet, TAKE 1 TABLET BY MOUTH EVERY DAY AT BEDTIME (Patient taking differently: Take 5 mg by mouth as needed. ), Disp: 90 tablet, Rfl: 1 .  LORazepam (ATIVAN) 1 MG tablet, Take 1 tablet (1 mg total) by mouth at bedtime as needed for anxiety., Disp: 30 tablet, Rfl: 0 .  tiZANidine (ZANAFLEX) 4 MG tablet, TAKE 1 TABLET BY MOUTH 3 TIMES A DAY FOR MUSCLE SPASMS, Disp: 270 tablet, Rfl: 3 .  methylPREDNISolone (MEDROL) 4 MG tablet, Taper from 6 pills po for one day to 1 pill po the last day over 6 days, Disp: 21 tablet, Rfl: 0  Current Facility-Administered Medications:  .  meclizine (ANTIVERT) tablet 25 mg, 25 mg, Oral, TID PRN, Leonie Man, Pramod S,  MD  PAST MEDICAL HISTORY: Past Medical History:  Diagnosis Date  . Allergy   . Anal fissure   . Arthritis    right knee  . Colon polyp 2005  . Depression    resolved - situational  . Family history of breast cancer   . Family history of leukemia   . Family history of ovarian cancer   . Family history of prostate cancer   . Family history of thyroid cancer   . Migraine    history - otc med prn  . Multiple sclerosis ([place])   . Multiple sclerosis ([place])   . Neuromuscular disorder ([place])    Multiple sclerosis -  no meds with 2014 pregnancy  . Obesity   . PCOS (polycystic ovarian syndrome)   . Pregnancy induced hypertension 03/2007   Resolved - [place] with 2008 pregnancy   . Ulcer 09/2009  . Vision abnormalities     PAST SURGICAL HISTORY: Past Surgical History:  Procedure Laterality Date  . carpel tunnel surery Left   . CESAREAN SECTION  2008   wh  . CESAREAN SECTION N/A 04/02/2013   Procedure: REPEAT CESAREAN SECTION;  Surgeon: Eldred Manges, MD;  Location: [facility];  Service: Obstetrics;  Laterality: N/A;  . CHOLECYSTECTOMY  2007  . DILATION AND CURETTAGE OF UTERUS    . DILATION AND EVACUATION  05/05/2012   Procedure: DILATATION AND EVACUATION;  Surgeon: Alwyn Pea, MD;  Location: [facility];  Service: Gynecology;  Laterality: N/A;  . FOOT SURGERY  [id number]   x 2 right foot  . FOOT TENDON TRANSFER  2004  . [place]  2011  . HERNIA REPAIR  1984  . KNEE ARTHROSCOPY     x 3 - right knee    FAMILY HISTORY: Family History  Problem Relation Age of Onset  . Stroke Mother   . Kidney disease Mother        ESRD  . Multiple sclerosis Mother   . Depression Mother   . Breast cancer Mother 41  . Ovarian cancer Mother 28       maybe ut?  . Thyroid cancer Mother 36       reports it was fairly 'devastating, possibly aggresive one'  . Bone cancer Mother 76       unsure if a separate primary or met  . Hypertension Father   . Diabetes Father        type II  . Sarcoidosis Father   . Asthma Daughter   . Hypertension Maternal Grandmother   . Diabetes Maternal Grandmother   . Bone cancer Maternal Grandmother 64  . Diabetes Paternal Grandfather   . Alzheimer's disease Paternal Grandfather   . Breast cancer Other 50  . Prostate cancer Maternal Uncle 65       no surgery  . Lung cancer Maternal Grandfather 11  . Leukemia Paternal Grandmother 56       died at 64  . Breast cancer Cousin 51    SOCIAL HISTORY:  Social History   Socioeconomic History  . Marital status: Married      Spouse name: Not on file  . Number of children: Not on file  . Years of education: Not on file  . Highest education level: Not on file  Occupational History  . Not on file  Tobacco Use  . Smoking status: Never Smoker  . Smokeless tobacco: Never Used  Substance and Sexual Activity  . Alcohol use: Yes    Comment: occasional  but none with pregnancy  . Drug use: No  . Sexual activity: Yes    Birth control/protection: None    Comment: preganat  Other Topics Concern  . Not on file  Social History Narrative   Currently at [employer] for [facility] [employer]; wants to enroll in RN program.   Lives with boyfriend   Regular exercise: yes   Social Determinants of Health   Financial Resource Strain:   . Difficulty of Paying Living Expenses: Not on file  Food Insecurity:   . Worried About Charity fundraiser in the Last Year: Not on file  . Ran Out of Food in the Last Year: Not on file  Transportation Needs:   . Lack of Transportation (Medical): Not on file  . Lack of Transportation (Non-Medical): Not on file  Physical Activity:   . Days of Exercise per Week: Not on file  . Minutes of Exercise per Session: Not on file  Stress:   . Feeling of Stress : Not on file  Social Connections:   . Frequency of Communication with Friends and Family: Not on file  . Frequency of Social Gatherings with Friends and Family: Not on file  . Attends Religious Services: Not on file  . Active Member of Clubs or Organizations: Not on file  . Attends Archivist Meetings: Not on file  . Marital Status: Not on file  Intimate Partner Violence:   . Fear of Current or Ex-Partner: Not on file  . Emotionally Abused: Not on file  . Physically Abused: Not on file  . Sexually Abused: Not on file     PHYSICAL EXAM  Vitals:   07/01/19 1604  BP: 117/80  Pulse: 65  Temp: (!) 96.2 F (35.7 C)  SpO2: 98%  Weight: 251 lb 8 oz (114.1 kg)  Height: 5' 9" (1.753 m)     Body mass index is 37.14  kg/m.   General: The patient is well-developed and well-nourished and in no acute distress.  Head and neck: The head is normocephalic and atraumatic.  The neck is tender over the right occiput, upper to mid cervical paraspinal muscles and trapezius muscle.  Range of motion is slightly reduced.  Neurologic Exam  Mental status: The patient is alert and oriented x 3 at the time of the examination. The patient has apparent normal recent and remote memory, with an apparently normal attention span and concentration ability.   Speech is normal.  Cranial nerves:   Extraocular muscles are normal.  Facial strength is normal.  Trapezius strength is normal.     No obvious hearing deficits are noted.  Motor:  Muscle bulk is normal.   Tone is mildly increased in right leg. Strength is  5 / 5 in the arms and left leg and proximal right leg and 4+/5 right foot/ankle..   Sensory: Sensory testing is intact proximally in the arms and legs  Coordination: She has good finger-nose-finger bilaterally.  Heel-to-shin is normal on the left and slightly reduced on the right.  Gait and station: Station is normal.   Her gait is fairly normal but the tandem gait is wide.  Romberg is negative.  Reflexes: Deep tendon reflexes are symmetric and normal bilaterally.       DIAGNOSTIC DATA (LABS, IMAGING, TESTING) - I reviewed patient records, labs, notes, testing and imaging myself where available.  Lab Results  Component Value Date   WBC 2.6 (L) 08/29/2017   HGB 12.1 08/29/2017   HCT 37.9 08/29/2017  MCV 84 08/29/2017   PLT 227 08/29/2017      Component Value Date/Time   NA 140 08/29/2017 0858   K 4.5 08/29/2017 0858   CL 104 08/29/2017 0858   CO2 22 08/29/2017 0858   GLUCOSE 78 08/29/2017 0858   GLUCOSE 79 03/19/2017 1411   BUN 9 08/29/2017 0858   CREATININE 0.77 08/29/2017 0858   CALCIUM 8.9 08/29/2017 0858   PROT 7.0 03/19/2017 1411   PROT 6.9 01/14/2017 1438   ALBUMIN 4.1 03/19/2017 1411    ALBUMIN 4.3 01/14/2017 1438   AST 12 03/19/2017 1411   ALT 8 03/19/2017 1411   ALKPHOS 36 (L) 03/19/2017 1411   BILITOT 0.9 03/19/2017 1411   BILITOT 0.4 01/14/2017 1438   GFRNONAA 100 08/29/2017 0858   GFRAA 116 08/29/2017 0858   Lab Results  Component Value Date   CHOL 182 03/19/2017   HDL 59.20 03/19/2017   LDLCALC 110 (H) 03/19/2017   TRIG 65.0 03/19/2017   CHOLHDL 3 03/19/2017   Lab Results  Component Value Date   HGBA1C 5.2 03/19/2017  DVT Lab Results  Component Value Date   VITAMINB12 330 09/27/2009   Lab Results  Component Value Date   TSH 0.539 08/29/2017       ASSESSMENT AND PLAN  Other fatigue  Multiple sclerosis ([place]) - Plan: MR BRAIN WO CONTRAST  High risk medication use  COVID-19  Insomnia, unspecified type  Myalgia   1.  She has completed the first and second years of [place].  She is continuing to get blood work on a regular basis through the [employer] [facility].  Blood work has been fine so far. 2.    She is experiencing headaches and myalgias and fatigue since her COVID-19 last month.  To help with her symptoms I will send in a prescription for steroid pack.  Additionally to help with the insomnia and the myalgias a prescription for lorazepam will be sent in 3.   Stay active and exercise.   We discussed ergonomic position of her computer. 4.   Note to receive extra time in course work due to COVID-19 and MS..   5.   Rtc 5-6 months, call sooner if new or worsening symptoms or other issues.    Richard A. Felecia Shelling, MD, PhD 3/53/2992, 4:26 PM Certified in Neurology, Clinical Neurophysiology, Sleep Medicine, Pain Medicine and Neuroimaging  [facility] [address], [place], [place] [address] [phone]

## 2019-07-08 ENCOUNTER — Telehealth: Payer: Self-pay | Admitting: Neurology

## 2019-07-08 NOTE — Telephone Encounter (Signed)
BCBS Auth: [id number] (exp. 07/08/19 to 08/06/19)/medicaid order sent to [facility]. They will reach out to the patient to schedule.

## 2019-07-23 ENCOUNTER — Encounter: Payer: Self-pay | Admitting: Neurology

## 2019-08-04 ENCOUNTER — Other Ambulatory Visit: Payer: BLUE CROSS/BLUE SHIELD

## 2019-09-14 ENCOUNTER — Other Ambulatory Visit: Payer: Self-pay | Admitting: Neurology

## 2019-09-15 ENCOUNTER — Encounter: Payer: Self-pay | Admitting: Neurology

## 2019-10-28 NOTE — Progress Notes (Signed)
Pt. Needs orders for the upcomming surgery.PST appointment on 10/29/19.Thank you.

## 2019-10-28 NOTE — Patient Instructions (Addendum)
DUE TO COVID-19 ONLY ONE VISITOR IS ALLOWED TO COME WITH YOU AND STAY IN THE WAITING ROOM ONLY DURING PRE OP AND PROCEDURE DAY OF SURGERY. THE 1 VISITOR MAY VISIT WITH YOU AFTER SURGERY IN YOUR PRIVATE ROOM DURING VISITING HOURS ONLY!  YOU NEED TO HAVE A COVID 19 TEST ON: 11/05/19 @ 9:30 am, THIS TEST MUST BE DONE BEFORE SURGERY, COME  [address], [place] , [id number].  ([place]) ONCE YOUR COVID TEST IS COMPLETED, PLEASE BEGIN THE QUARANTINE INSTRUCTIONS AS OUTLINED IN YOUR HANDOUT.                Jessica Roberson    Your procedure is scheduled on: 11/09/19   Report to [facility] Main  Entrance   Report to short stay at: 5:30 AM     Call this number if you have problems the morning of surgery [phone]    Remember: Do not eat food or drink liquids :After Midnight.   BRUSH YOUR TEETH MORNING OF SURGERY AND RINSE YOUR MOUTH OUT, NO CHEWING GUM CANDY OR MINTS.                                 You may not have any metal on your body including hair pins and              piercings  Do not wear jewelry, make-up, lotions, powders or perfumes, deodorant             Do not wear nail polish on your fingernails.  Do not shave  48 hours prior to surgery.     Do not bring valuables to the hospital. [place].  Contacts, dentures or bridgework may not be worn into surgery.  Leave suitcase in the car. After surgery it may be brought to your room.     Patients discharged the day of surgery will not be allowed to drive home. IF YOU ARE HAVING SURGERY AND GOING HOME THE SAME DAY, YOU MUST HAVE AN ADULT TO DRIVE YOU HOME AND BE WITH YOU FOR 24 HOURS. YOU MAY GO HOME BY TAXI OR UBER OR ORTHERWISE, BUT AN ADULT MUST ACCOMPANY YOU HOME AND STAY WITH YOU FOR 24 HOURS.  Name and phone number of your driver:  Special Instructions: N/A              Please read over the following fact sheets you were  given: _____________________________________________________________________         [facility] - Preparing for Surgery Before surgery, you can play an important role.  Because skin is not sterile, your skin needs to be as free of germs as possible.  You can reduce the number of germs on your skin by washing with CHG (chlorahexidine gluconate) soap before surgery.  CHG is an antiseptic cleaner which kills germs and bonds with the skin to continue killing germs even after washing. Please DO NOT use if you have an allergy to CHG or antibacterial soaps.  If your skin becomes reddened/irritated stop using the CHG and inform your nurse when you arrive at Short Stay. Do not shave (including legs and underarms) for at least 48 hours prior to the first CHG shower.  You may shave your face/neck. Please follow these instructions carefully:  1.  Shower with  CHG Soap the night before surgery and the  morning of Surgery.  2.  If you choose to wash your hair, wash your hair first as usual with your  normal  shampoo.  3.  After you shampoo, rinse your hair and body thoroughly to remove the  shampoo.                           4.  Use CHG as you would any other liquid soap.  You can apply chg directly  to the skin and wash                       Gently with a scrungie or clean washcloth.  5.  Apply the CHG Soap to your body ONLY FROM THE NECK DOWN.   Do not use on face/ open                           Wound or open sores. Avoid contact with eyes, ears mouth and genitals (private parts).                       Wash face,  Genitals (private parts) with your normal soap.             6.  Wash thoroughly, paying special attention to the area where your surgery  will be performed.  7.  Thoroughly rinse your body with warm water from the neck down.  8.  DO NOT shower/wash with your normal soap after using and rinsing off  the CHG Soap.                9.  Pat yourself dry with a clean towel.            10.  Wear clean  pajamas.            11.  Place clean sheets on your bed the night of your first shower and do not  sleep with pets. Day of Surgery : Do not apply any lotions/deodorants the morning of surgery.  Please wear clean clothes to the hospital/surgery center.  FAILURE TO FOLLOW THESE INSTRUCTIONS MAY RESULT IN THE CANCELLATION OF YOUR SURGERY PATIENT SIGNATURE_________________________________  NURSE SIGNATURE__________________________________  ________________________________________________________________________

## 2019-10-29 ENCOUNTER — Encounter (HOSPITAL_COMMUNITY)
Admission: RE | Admit: 2019-10-29 | Discharge: 2019-10-29 | Disposition: A | Discharge status: Home / Self Care | Payer: BLUE CROSS/BLUE SHIELD | Source: Ambulatory Visit | Attending: Orthopedic Surgery | Admitting: Orthopedic Surgery

## 2019-10-29 ENCOUNTER — Encounter (HOSPITAL_COMMUNITY): Payer: Self-pay

## 2019-10-29 ENCOUNTER — Other Ambulatory Visit: Payer: Self-pay

## 2019-10-29 DIAGNOSIS — Z01812 Encounter for preprocedural laboratory examination: Secondary | ICD-10-CM | POA: Diagnosis not present

## 2019-10-29 HISTORY — DX: Anxiety disorder, unspecified: F41.9

## 2019-10-29 LAB — BASIC METABOLIC PANEL
Anion gap: 6 (ref 5–15)
BUN: 9 mg/dL (ref 6–20)
CO2: 27 mmol/L (ref 22–32)
Calcium: 8.7 mg/dL — ABNORMAL LOW (ref 8.9–10.3)
Chloride: 106 mmol/L (ref 98–111)
Creatinine, Ser: 0.8 mg/dL (ref 0.44–1.00)
GFR calc Af Amer: >60 mL/min (ref >60)
GFR calc non Af Amer: >60 mL/min (ref >60)
Glucose, Bld: 87 mg/dL (ref 70–99)
Potassium: 4.3 mmol/L (ref 3.5–5.1)
Sodium: 139 mmol/L (ref 135–145)

## 2019-10-29 LAB — CBC
HCT: 40.2 % (ref 36.0–46.0)
Hemoglobin: 13 g/dL (ref 12.0–15.0)
MCH: 27.5 pg (ref 26.0–34.0)
MCHC: 32.3 g/dL (ref 30.0–36.0)
MCV: 85.2 fL (ref 80.0–100.0)
Platelets: 241 10*3/uL (ref 150–400)
RBC: 4.72 MIL/uL (ref 3.87–5.11)
RDW: 14.1 % (ref 11.5–15.5)
WBC: 3 10*3/uL — ABNORMAL LOW (ref 4.0–10.5)
nRBC: 0 % (ref 0.0–0.2)

## 2019-10-29 LAB — SURGICAL PCR SCREEN
MRSA, PCR: NEGATIVE
Staphylococcus aureus: NEGATIVE

## 2019-10-29 NOTE — Progress Notes (Signed)
COVID Vaccine Completed:No Date COVID Vaccine completed: COVID vaccine manufacturer: [place]   PCP - Minette Brine : FNP Cardiologist -   Chest x-ray -  EKG -  Stress Test -  ECHO -  Cardiac Cath -   Sleep Study -  CPAP -   Fasting Blood Sugar -  Checks Blood Sugar _____ times a day  Blood Thinner Instructions: Aspirin Instructions: Last Dose:  Anesthesia review:   Patient denies shortness of breath, fever, cough and chest pain at PAT appointment   Patient verbalized understanding of instructions that were given to them at the PAT appointment. Patient was also instructed that they will need to review over the PAT instructions again at home before surgery.

## 2019-10-30 ENCOUNTER — Other Ambulatory Visit: Payer: Self-pay | Admitting: Orthopedic Surgery

## 2019-10-30 ENCOUNTER — Encounter (HOSPITAL_COMMUNITY): Admission: RE | Admit: 2019-10-30 | Payer: BLUE CROSS/BLUE SHIELD | Source: Ambulatory Visit

## 2019-11-05 ENCOUNTER — Other Ambulatory Visit (HOSPITAL_COMMUNITY)
Admission: RE | Admit: 2019-11-05 | Discharge: 2019-11-05 | Disposition: A | Discharge status: Home / Self Care | Payer: BLUE CROSS/BLUE SHIELD | Source: Ambulatory Visit | Attending: Orthopedic Surgery | Admitting: Orthopedic Surgery

## 2019-11-05 DIAGNOSIS — Z01812 Encounter for preprocedural laboratory examination: Secondary | ICD-10-CM | POA: Diagnosis present

## 2019-11-05 DIAGNOSIS — Z20822 Contact with and (suspected) exposure to covid-19: Secondary | ICD-10-CM | POA: Insufficient documentation

## 2019-11-05 LAB — SARS CORONAVIRUS 2 (TAT 6-24 HRS): SARS Coronavirus 2: NEGATIVE

## 2019-11-07 ENCOUNTER — Encounter (HOSPITAL_COMMUNITY): Payer: Self-pay | Admitting: Orthopedic Surgery

## 2019-11-07 NOTE — Anesthesia Preprocedure Evaluation (Addendum)
Anesthesia Evaluation  Patient identified by MRN, date of birth, ID band Patient awake    Reviewed: Allergy & Precautions, NPO status , Patient's Chart, lab work & pertinent test results  Airway Mallampati: III  TM Distance: >3 FB Neck ROM: Full    Dental no notable dental hx. (+) Teeth Intact   Pulmonary  Hx/o Covid-19 05/2019   Pulmonary exam normal breath sounds clear to auscultation       Cardiovascular hypertension, Pt. on medications Normal cardiovascular exam Rhythm:Regular Rate:Normal     Neuro/Psych  Headaches, PSYCHIATRIC DISORDERS Anxiety Depression MS- stable on Rx  Neuromuscular disease    GI/Hepatic Neg liver ROS, GERD  Medicated and Controlled,  Endo/Other  Morbid obesityPCOS  Renal/GU negative Renal ROS  negative genitourinary   Musculoskeletal  (+) Arthritis , Osteoarthritis,  OA right knee   Abdominal (+) + obese,   Peds  Hematology   Anesthesia Other Findings   Reproductive/Obstetrics HSV                           Anesthesia Physical Anesthesia Plan  ASA: III  Anesthesia Plan: Spinal   Post-op Pain Management:  Regional for Post-op pain   Induction:   PONV Risk Score and Plan: 3 and Midazolam, Propofol infusion, Ondansetron and Treatment may vary due to age or medical condition  Airway Management Planned: Natural Airway, Simple Face Mask and Nasal Cannula  Additional Equipment:   Intra-op Plan:   Post-operative Plan:   Informed Consent: I have reviewed the patients History and Physical, chart, labs and discussed the procedure including the risks, benefits and alternatives for the proposed anesthesia with the patient or authorized representative who has indicated his/her understanding and acceptance.     Dental advisory given  Plan Discussed with: CRNA and Anesthesiologist  Anesthesia Plan Comments:        Anesthesia Quick Evaluation

## 2019-11-08 MED ORDER — BUPIVACAINE LIPOSOME 1.3 % IJ SUSP
20.0000 mL | Freq: Once | INTRAMUSCULAR | Status: DC
  Filled 2019-11-08: qty 20

## 2019-11-09 ENCOUNTER — Ambulatory Visit (HOSPITAL_COMMUNITY)
Admission: RE | Admit: 2019-11-09 | Discharge: 2019-11-09 | Disposition: A | Discharge status: Home / Self Care | Payer: BLUE CROSS/BLUE SHIELD | Attending: Orthopedic Surgery | Admitting: Orthopedic Surgery

## 2019-11-09 ENCOUNTER — Encounter (HOSPITAL_COMMUNITY)
Admission: RE | Disposition: A | Discharge status: Home / Self Care | Payer: Self-pay | Source: Home / Self Care | Attending: Orthopedic Surgery

## 2019-11-09 ENCOUNTER — Ambulatory Visit (HOSPITAL_COMMUNITY): Payer: BLUE CROSS/BLUE SHIELD | Admitting: Certified Registered Nurse Anesthetist

## 2019-11-09 DIAGNOSIS — K219 Gastro-esophageal reflux disease without esophagitis: Secondary | ICD-10-CM | POA: Insufficient documentation

## 2019-11-09 DIAGNOSIS — G4700 Insomnia, unspecified: Secondary | ICD-10-CM | POA: Insufficient documentation

## 2019-11-09 DIAGNOSIS — Z6839 Body mass index (BMI) 39.0-39.9, adult: Secondary | ICD-10-CM | POA: Diagnosis not present

## 2019-11-09 DIAGNOSIS — F419 Anxiety disorder, unspecified: Secondary | ICD-10-CM | POA: Insufficient documentation

## 2019-11-09 DIAGNOSIS — I10 Essential (primary) hypertension: Secondary | ICD-10-CM | POA: Insufficient documentation

## 2019-11-09 DIAGNOSIS — Z8616 Personal history of COVID-19: Secondary | ICD-10-CM | POA: Insufficient documentation

## 2019-11-09 DIAGNOSIS — G35 Multiple sclerosis: Secondary | ICD-10-CM | POA: Diagnosis not present

## 2019-11-09 DIAGNOSIS — F329 Major depressive disorder, single episode, unspecified: Secondary | ICD-10-CM | POA: Insufficient documentation

## 2019-11-09 DIAGNOSIS — M1711 Unilateral primary osteoarthritis, right knee: Secondary | ICD-10-CM | POA: Insufficient documentation

## 2019-11-09 DIAGNOSIS — E282 Polycystic ovarian syndrome: Secondary | ICD-10-CM | POA: Diagnosis not present

## 2019-11-09 DIAGNOSIS — Z79899 Other long term (current) drug therapy: Secondary | ICD-10-CM | POA: Diagnosis not present

## 2019-11-09 HISTORY — PX: TOTAL KNEE ARTHROPLASTY: SHX125

## 2019-11-09 LAB — CBC WITH DIFFERENTIAL/PLATELET
Abs Immature Granulocytes: 0.01 10*3/uL (ref 0.00–0.07)
Basophils Absolute: 0 10*3/uL (ref 0.0–0.1)
Basophils Relative: 1 %
Eosinophils Absolute: 0 10*3/uL (ref 0.0–0.5)
Eosinophils Relative: 1 %
HCT: 40.2 % (ref 36.0–46.0)
Hemoglobin: 13.4 g/dL (ref 12.0–15.0)
Immature Granulocytes: 0 %
Lymphocytes Relative: 16 %
Lymphs Abs: 0.7 10*3/uL (ref 0.7–4.0)
MCH: 27.8 pg (ref 26.0–34.0)
MCHC: 33.3 g/dL (ref 30.0–36.0)
MCV: 83.4 fL (ref 80.0–100.0)
Monocytes Absolute: 0.1 10*3/uL (ref 0.1–1.0)
Monocytes Relative: 2 %
Neutro Abs: 3.4 10*3/uL (ref 1.7–7.7)
Neutrophils Relative %: 80 %
Platelets: 224 10*3/uL (ref 150–400)
RBC: 4.82 MIL/uL (ref 3.87–5.11)
RDW: 14.5 % (ref 11.5–15.5)
WBC: 4.2 10*3/uL (ref 4.0–10.5)
nRBC: 0 % (ref 0.0–0.2)

## 2019-11-09 LAB — COMPREHENSIVE METABOLIC PANEL
ALT: 16 U/L (ref 0–44)
AST: 20 U/L (ref 15–41)
Albumin: 4.1 g/dL (ref 3.5–5.0)
Alkaline Phosphatase: 35 U/L — ABNORMAL LOW (ref 38–126)
Anion gap: 7 (ref 5–15)
BUN: 10 mg/dL (ref 6–20)
CO2: 26 mmol/L (ref 22–32)
Calcium: 9 mg/dL (ref 8.9–10.3)
Chloride: 108 mmol/L (ref 98–111)
Creatinine, Ser: 0.74 mg/dL (ref 0.44–1.00)
GFR calc Af Amer: >60 mL/min (ref >60)
GFR calc non Af Amer: >60 mL/min (ref >60)
Glucose, Bld: 97 mg/dL (ref 70–99)
Potassium: 5.2 mmol/L — ABNORMAL HIGH (ref 3.5–5.1)
Sodium: 141 mmol/L (ref 135–145)
Total Bilirubin: 0.5 mg/dL (ref 0.3–1.2)
Total Protein: 7.3 g/dL (ref 6.5–8.1)

## 2019-11-09 LAB — PREGNANCY, URINE: Preg Test, Ur: NEGATIVE

## 2019-11-09 SURGERY — ARTHROPLASTY, KNEE, TOTAL
Anesthesia: Spinal | Site: Knee | Laterality: Right

## 2019-11-09 MED ORDER — SODIUM CHLORIDE 0.9% FLUSH
INTRAVENOUS | Status: DC | PRN
  Administered 2019-11-09: 20 mL

## 2019-11-09 MED ORDER — MEPERIDINE HCL 50 MG/ML IJ SOLN: 6.2500 mg | INTRAMUSCULAR | Status: DC | PRN

## 2019-11-09 MED ORDER — OXYCODONE HCL 5 MG PO TABS: 5.0000 mg | ORAL_TABLET | Freq: Four times a day (QID) | ORAL | 0 refills | Status: DC | PRN

## 2019-11-09 MED ORDER — GLYCOPYRROLATE PF 0.2 MG/ML IJ SOSY
PREFILLED_SYRINGE | INTRAMUSCULAR | Status: AC
  Filled 2019-11-09: qty 1

## 2019-11-09 MED ORDER — PROPOFOL 500 MG/50ML IV EMUL
INTRAVENOUS | Status: DC | PRN
  Administered 2019-11-09: 50 ug/kg/min via INTRAVENOUS

## 2019-11-09 MED ORDER — WATER FOR IRRIGATION, STERILE IR SOLN
Status: DC | PRN
  Administered 2019-11-09: 2000 mL

## 2019-11-09 MED ORDER — LACTATED RINGERS IV BOLUS
500.0000 mL | Freq: Once | INTRAVENOUS | Status: AC
  Administered 2019-11-09: 500 mL via INTRAVENOUS

## 2019-11-09 MED ORDER — PHENYLEPHRINE HCL-NACL 10-0.9 MG/250ML-% IV SOLN
INTRAVENOUS | Status: DC | PRN
Start: 2019-11-09 — End: 2019-11-09
  Administered 2019-11-09: 40 ug/min via INTRAVENOUS

## 2019-11-09 MED ORDER — GABAPENTIN 300 MG PO CAPS: 300.0000 mg | ORAL_CAPSULE | Freq: Three times a day (TID) | ORAL | 0 refills | Status: DC

## 2019-11-09 MED ORDER — FENTANYL CITRATE (PF) 100 MCG/2ML IJ SOLN
50.0000 ug | Freq: Once | INTRAMUSCULAR | Status: AC
  Administered 2019-11-09: 100 ug via INTRAVENOUS
  Filled 2019-11-09: qty 2

## 2019-11-09 MED ORDER — OXYCODONE HCL 5 MG PO TABS
ORAL_TABLET | ORAL | Status: AC
  Filled 2019-11-09: qty 1

## 2019-11-09 MED ORDER — OXYCODONE HCL 5 MG PO TABS
ORAL_TABLET | ORAL | Status: AC
  Administered 2019-11-09: 5 mg via ORAL
  Filled 2019-11-09: qty 1

## 2019-11-09 MED ORDER — MEPIVACAINE HCL (PF) 2 % IJ SOLN
INTRAMUSCULAR | Status: DC | PRN
  Administered 2019-11-09: 3 mL via EPIDURAL

## 2019-11-09 MED ORDER — CHLORHEXIDINE GLUCONATE 0.12 % MT SOLN
15.0000 mL | Freq: Once | OROMUCOSAL | Status: AC
  Administered 2019-11-09: 15 mL via OROMUCOSAL

## 2019-11-09 MED ORDER — FENTANYL CITRATE (PF) 100 MCG/2ML IJ SOLN
INTRAMUSCULAR | Status: AC
  Filled 2019-11-09: qty 2

## 2019-11-09 MED ORDER — OXYCODONE HCL 5 MG PO TABS: 5.0000 mg | ORAL_TABLET | ORAL | Status: DC | PRN

## 2019-11-09 MED ORDER — PROPOFOL 500 MG/50ML IV EMUL
INTRAVENOUS | Status: AC
  Filled 2019-11-09: qty 50

## 2019-11-09 MED ORDER — PHENYLEPHRINE 40 MCG/ML (10ML) SYRINGE FOR IV PUSH (FOR BLOOD PRESSURE SUPPORT): PREFILLED_SYRINGE | INTRAVENOUS | Status: DC | PRN

## 2019-11-09 MED ORDER — APIXABAN 2.5 MG PO TABS: 2.5000 mg | ORAL_TABLET | Freq: Two times a day (BID) | ORAL | 0 refills | Status: DC

## 2019-11-09 MED ORDER — SODIUM CHLORIDE 0.9 % IR SOLN
Status: DC | PRN
  Administered 2019-11-09 (×2): 1000 mL

## 2019-11-09 MED ORDER — BUPIVACAINE-EPINEPHRINE (PF) 0.25% -1:200000 IJ SOLN
INTRAMUSCULAR | Status: AC
  Filled 2019-11-09: qty 30

## 2019-11-09 MED ORDER — FENTANYL CITRATE (PF) 100 MCG/2ML IJ SOLN
25.0000 ug | INTRAMUSCULAR | Status: DC | PRN
  Administered 2019-11-09: 50 ug via INTRAVENOUS

## 2019-11-09 MED ORDER — TRANEXAMIC ACID-NACL 1000-0.7 MG/100ML-% IV SOLN
1000.0000 mg | INTRAVENOUS | Status: AC
  Administered 2019-11-09: 1000 mg via INTRAVENOUS
  Filled 2019-11-09: qty 100

## 2019-11-09 MED ORDER — LACTATED RINGERS IV SOLN: INTRAVENOUS | Status: DC

## 2019-11-09 MED ORDER — PROPOFOL 10 MG/ML IV BOLUS
INTRAVENOUS | Status: DC | PRN
  Administered 2019-11-09: 10 mg via INTRAVENOUS
  Administered 2019-11-09: 20 mg via INTRAVENOUS

## 2019-11-09 MED ORDER — ONDANSETRON HCL 4 MG PO TABS
4.0000 mg | ORAL_TABLET | Freq: Four times a day (QID) | ORAL | Status: DC | PRN
  Administered 2019-11-09: 4 mg via ORAL
  Filled 2019-11-09 (×3): qty 1

## 2019-11-09 MED ORDER — GABAPENTIN 300 MG PO CAPS
300.0000 mg | ORAL_CAPSULE | Freq: Once | ORAL | Status: AC
  Administered 2019-11-09: 300 mg via ORAL
  Filled 2019-11-09: qty 1

## 2019-11-09 MED ORDER — DEXAMETHASONE SODIUM PHOSPHATE 10 MG/ML IJ SOLN
8.0000 mg | Freq: Once | INTRAMUSCULAR | Status: AC
  Administered 2019-11-09: 8 mg via INTRAVENOUS

## 2019-11-09 MED ORDER — ONDANSETRON HCL 4 MG/2ML IJ SOLN
INTRAMUSCULAR | Status: AC
  Filled 2019-11-09: qty 2

## 2019-11-09 MED ORDER — LACTATED RINGERS IV BOLUS
250.0000 mL | Freq: Once | INTRAVENOUS | Status: AC
  Administered 2019-11-09: 250 mL via INTRAVENOUS

## 2019-11-09 MED ORDER — METOCLOPRAMIDE HCL 5 MG/ML IJ SOLN: 5.0000 mg | Freq: Three times a day (TID) | INTRAMUSCULAR | Status: DC | PRN

## 2019-11-09 MED ORDER — BUPIVACAINE LIPOSOME 1.3 % IJ SUSP
INTRAMUSCULAR | Status: DC | PRN
  Administered 2019-11-09: 20 mL

## 2019-11-09 MED ORDER — MEPIVACAINE HCL (PF) 2 % IJ SOLN
INTRAMUSCULAR | Status: AC
  Filled 2019-11-09: qty 20

## 2019-11-09 MED ORDER — METHOCARBAMOL 500 MG PO TABS: 500.0000 mg | ORAL_TABLET | Freq: Four times a day (QID) | ORAL | 0 refills | Status: DC

## 2019-11-09 MED ORDER — POVIDONE-IODINE 10 % EX SWAB
2.0000 "application " | Freq: Once | CUTANEOUS | Status: AC
  Administered 2019-11-09: 2 via TOPICAL

## 2019-11-09 MED ORDER — ONDANSETRON HCL 4 MG/2ML IJ SOLN: 4.0000 mg | Freq: Four times a day (QID) | INTRAMUSCULAR | Status: DC | PRN

## 2019-11-09 MED ORDER — ROPIVACAINE HCL 7.5 MG/ML IJ SOLN
INTRAMUSCULAR | Status: DC | PRN
  Administered 2019-11-09: 20 mL via PERINEURAL

## 2019-11-09 MED ORDER — ACETAMINOPHEN 500 MG PO TABS
1000.0000 mg | ORAL_TABLET | Freq: Once | ORAL | Status: AC
  Administered 2019-11-09: 1000 mg via ORAL
  Filled 2019-11-09: qty 2

## 2019-11-09 MED ORDER — FENTANYL CITRATE (PF) 100 MCG/2ML IJ SOLN
INTRAMUSCULAR | Status: DC | PRN
  Administered 2019-11-09 (×4): 25 ug via INTRAVENOUS

## 2019-11-09 MED ORDER — OXYCODONE HCL 5 MG PO TABS
5.0000 mg | ORAL_TABLET | Freq: Once | ORAL | Status: AC | PRN
  Administered 2019-11-09: 5 mg via ORAL

## 2019-11-09 MED ORDER — MIDAZOLAM HCL 2 MG/2ML IJ SOLN
1.0000 mg | INTRAMUSCULAR | Status: DC
  Administered 2019-11-09: 2 mg via INTRAVENOUS
  Filled 2019-11-09: qty 2

## 2019-11-09 MED ORDER — DEXAMETHASONE SODIUM PHOSPHATE 10 MG/ML IJ SOLN
INTRAMUSCULAR | Status: AC
  Filled 2019-11-09: qty 1

## 2019-11-09 MED ORDER — ORAL CARE MOUTH RINSE: 15.0000 mL | Freq: Once | OROMUCOSAL | Status: AC

## 2019-11-09 MED ORDER — GLYCOPYRROLATE PF 0.2 MG/ML IJ SOSY
PREFILLED_SYRINGE | INTRAMUSCULAR | Status: DC | PRN
  Administered 2019-11-09: .2 mg via INTRAVENOUS

## 2019-11-09 MED ORDER — PROPOFOL 10 MG/ML IV BOLUS
INTRAVENOUS | Status: AC
  Filled 2019-11-09: qty 20

## 2019-11-09 MED ORDER — OXYCODONE HCL 5 MG/5ML PO SOLN: 5.0000 mg | Freq: Once | ORAL | Status: AC | PRN

## 2019-11-09 MED ORDER — CLINDAMYCIN PHOSPHATE 900 MG/50ML IV SOLN
900.0000 mg | INTRAVENOUS | Status: AC
  Administered 2019-11-09: 900 mg via INTRAVENOUS
  Filled 2019-11-09: qty 50

## 2019-11-09 MED ORDER — SODIUM CHLORIDE (PF) 0.9 % IJ SOLN
INTRAMUSCULAR | Status: AC
  Filled 2019-11-09: qty 50

## 2019-11-09 MED ORDER — ONDANSETRON HCL 4 MG/2ML IJ SOLN: 4.0000 mg | Freq: Once | INTRAMUSCULAR | Status: DC | PRN

## 2019-11-09 MED ORDER — ONDANSETRON 4 MG PO TBDP
ORAL_TABLET | ORAL | Status: AC
  Filled 2019-11-09: qty 1

## 2019-11-09 MED ORDER — BUPIVACAINE-EPINEPHRINE 0.25% -1:200000 IJ SOLN
INTRAMUSCULAR | Status: DC | PRN
  Administered 2019-11-09: 20 mL

## 2019-11-09 MED ORDER — ONDANSETRON HCL 4 MG/2ML IJ SOLN
INTRAMUSCULAR | Status: DC | PRN
  Administered 2019-11-09: 4 mg via INTRAVENOUS

## 2019-11-09 MED ORDER — METOCLOPRAMIDE HCL 5 MG PO TABS
5.0000 mg | ORAL_TABLET | Freq: Three times a day (TID) | ORAL | Status: DC | PRN
  Filled 2019-11-09: qty 2

## 2019-11-09 MED ORDER — FENTANYL CITRATE (PF) 100 MCG/2ML IJ SOLN
INTRAMUSCULAR | Status: AC
  Administered 2019-11-09: 50 ug via INTRAVENOUS
  Filled 2019-11-09: qty 2

## 2019-11-09 SURGICAL SUPPLY — 63 items
BAG SPEC THK2 15X12 ZIP CLS (MISCELLANEOUS) ×1
BAG ZIPLOCK 12X15 (MISCELLANEOUS) ×2 IMPLANT
BLADE SAGITTAL 13X1.27X60 (BLADE) ×2 IMPLANT
BLADE SAW SGTL 83.5X18.5 (BLADE) ×2 IMPLANT
BLADE SURG 15 STRL LF DISP TIS (BLADE) ×1 IMPLANT
BLADE SURG 15 STRL SS (BLADE) ×2
BLADE SURG SZ10 CARB STEEL (BLADE) ×7 IMPLANT
BNDG CMPR MED 10X6 ELC LF (GAUZE/BANDAGES/DRESSINGS) ×1
BNDG ELASTIC 6X10 VLCR STRL LF (GAUZE/BANDAGES/DRESSINGS) ×1 IMPLANT
BNDG ELASTIC 6X5.8 VLCR STR LF (GAUZE/BANDAGES/DRESSINGS) ×2 IMPLANT
BOWL SMART MIX CTS (DISPOSABLE) ×2 IMPLANT
BSPLAT TIB 5D C 16NT STM RT (Knees) ×1 IMPLANT
CEMENT BONE SIMPLEX SPEEDSET (Cement) ×4 IMPLANT
CLSR STERI-STRIP ANTIMIC 1/2X4 (GAUZE/BANDAGES/DRESSINGS) ×1 IMPLANT
COVER SURGICAL LIGHT HANDLE (MISCELLANEOUS) ×2 IMPLANT
COVER WAND RF STERILE (DRAPES) IMPLANT
CUFF TOURN SGL QUICK 34 (TOURNIQUET CUFF) ×2
CUFF TRNQT CYL 34X4.125X (TOURNIQUET CUFF) ×1 IMPLANT
DECANTER SPIKE VIAL GLASS SM (MISCELLANEOUS) ×4 IMPLANT
DRAPE INCISE IOBAN 66X45 STRL (DRAPES) ×4 IMPLANT
DRAPE U-SHAPE 47X51 STRL (DRAPES) ×2 IMPLANT
DRSG AQUACEL AG ADV 3.5X10 (GAUZE/BANDAGES/DRESSINGS) ×2 IMPLANT
DRSG AQUACEL AG ADV 3.5X14 (GAUZE/BANDAGES/DRESSINGS) ×1 IMPLANT
DURAPREP 26ML APPLICATOR (WOUND CARE) ×4 IMPLANT
ELECT REM PT RETURN 15FT ADLT (MISCELLANEOUS) ×2 IMPLANT
FEMUR  CMT CCR STD SZ6 R KNEE (Knees) ×2 IMPLANT
FEMUR CMT CCR STD SZ6 R KNEE (Knees) ×1 IMPLANT
FEMUR CMTD CCR STD SZ6 R KNEE (Knees) IMPLANT
GLOVE BIOGEL M STRL SZ7.5 (GLOVE) ×2 IMPLANT
GLOVE BIOGEL PI IND STRL 7.5 (GLOVE) ×1 IMPLANT
GLOVE BIOGEL PI IND STRL 8.5 (GLOVE) ×2 IMPLANT
GLOVE BIOGEL PI INDICATOR 7.5 (GLOVE) ×1
GLOVE BIOGEL PI INDICATOR 8.5 (GLOVE) ×2
GLOVE SURG ORTHO 8.0 STRL STRW (GLOVE) ×6 IMPLANT
GOWN STRL REUS W/ TWL XL LVL3 (GOWN DISPOSABLE) ×2 IMPLANT
GOWN STRL REUS W/TWL XL LVL3 (GOWN DISPOSABLE) ×4
HANDPIECE INTERPULSE COAX TIP (DISPOSABLE) ×2
HOLDER FOLEY CATH W/STRAP (MISCELLANEOUS) ×2 IMPLANT
HOOD PEEL AWAY FLYTE STAYCOOL (MISCELLANEOUS) ×6 IMPLANT
INSERT TIB ARTISURF SZ CD/6-9 (Insert) ×1 IMPLANT
KIT TURNOVER KIT A (KITS) IMPLANT
MANIFOLD NEPTUNE II (INSTRUMENTS) ×2 IMPLANT
NEEDLE HYPO 22GX1.5 SAFETY (NEEDLE) ×2 IMPLANT
NS IRRIG 1000ML POUR BTL (IV SOLUTION) ×2 IMPLANT
PACK TOTAL KNEE CUSTOM (KITS) ×2 IMPLANT
PENCIL SMOKE EVACUATOR (MISCELLANEOUS) ×2 IMPLANT
PROTECTOR NERVE ULNAR (MISCELLANEOUS) ×2 IMPLANT
SET HNDPC FAN SPRY TIP SCT (DISPOSABLE) ×1 IMPLANT
STEM COMP PATELLA VE 32 (Knees) ×1 IMPLANT
STEM TIBIA 5 DEG SZ C R KNEE (Knees) IMPLANT
STRIP CLOSURE SKIN 1/2X4 (GAUZE/BANDAGES/DRESSINGS) ×2 IMPLANT
SUT BONE WAX W31G (SUTURE) ×2 IMPLANT
SUT MNCRL AB 3-0 PS2 18 (SUTURE) ×2 IMPLANT
SUT STRATAFIX 0 PDS 27 VIOLET (SUTURE) ×2
SUT STRATAFIX PDS+ 0 24IN (SUTURE) ×2 IMPLANT
SUT VIC AB 1 CT1 36 (SUTURE) ×3 IMPLANT
SUTURE STRATFX 0 PDS 27 VIOLET (SUTURE) ×1 IMPLANT
SYR CONTROL 10ML LL (SYRINGE) ×4 IMPLANT
TIBIA STEM 5 DEG SZ C R KNEE (Knees) ×2 IMPLANT
TRAY FOLEY MTR SLVR 16FR STAT (SET/KITS/TRAYS/PACK) ×2 IMPLANT
WATER STERILE IRR 1000ML POUR (IV SOLUTION) ×4 IMPLANT
WRAP KNEE MAXI GEL POST OP (GAUZE/BANDAGES/DRESSINGS) ×2 IMPLANT
YANKAUER SUCT BULB TIP 10FT TU (MISCELLANEOUS) ×2 IMPLANT

## 2019-11-09 NOTE — Progress Notes (Signed)
AssistedDr. Foster with right, ultrasound guided, adductor canal block. Side rails up, monitors on throughout procedure. See vital signs in flow sheet. Tolerated Procedure well.  

## 2019-11-09 NOTE — Progress Notes (Signed)
Physical Therapy Treatment Patient Details Name: Jessica Roberson MRN: [id number] DOB: 04/18/1981 Today's Date: 11/09/2019    History of Present Illness Patient is 39 y.o. female s/p Rt TKA on 11/09/19 with PMH significant for HTN, MS, depression, OA, anxiety.    PT Comments    Patient seen for additional acute PT session in preparation for safe discharge home. Patient's spouse present for session. Pt demonstrated good recall for safe mobility with RW for transfers and gait. Pt's spouse educated on guarding and provided in safe manner. Stair mobility reviewed 2x with hand rail and crutch. No overt LOB and pt with good recall for "up with good, down with bad". Pt's husband provided safe guarding/assist. Pt/spouse educated on exercise for HEP and on safe donning/doffing of knee immobilizer brace as well as strength test to determine when she is safe to remove brace for mobility. Patient requesting HHPT initially as she has 14 stairs to enter/exit home. RN notified MD. Patient will benefit from skilled PT follow up in [facility] setting to improve safety with mobility and progress ROM/strength. She is safe to discharge home with assist from husband, will follow in acute setting.    Follow Up Recommendations  Follow surgeon's recommendation for DC plan and follow-up therapies;Home health PT     Equipment Recommendations  3in1 (PT)    Recommendations for Other Services       Precautions / Restrictions Precautions Precautions: Fall Precaution Comments: Rt knee immobilizer used due to poor quad activation Required Braces or Orthoses: Knee Immobilizer - Right Restrictions Weight Bearing Restrictions: No    Mobility  Bed Mobility Overal bed mobility: Needs Assistance Bed Mobility: Supine to Sit;Sit to Supine     Supine to sit: Supervision Sit to supine: Supervision   General bed mobility comments: pt able to recall use of gait belt to assist with Rt LE mobility. no assist  required.  Transfers Overall transfer level: Needs assistance Equipment used: Rolling walker (2 wheeled) Transfers: Sit to/from Stand Sit to Stand: Supervision;Min guard Stand pivot transfers: Min guard;Min assist       General transfer comment: patient with good recall for safe technique with RW. no assist required for power up.   Ambulation/Gait Ambulation/Gait assistance: Min guard Gait Distance (Feet): 60 Feet Assistive device: Rolling walker (2 wheeled) Gait Pattern/deviations: Step-to pattern;Decreased stride length;Decreased weight shift to right;Decreased stance time - right Gait velocity: decr   General Gait Details: pt with good use of UE's on RW to assist Rt LE during stance phase. Pt maintained safe proximity to RW and no overt LOB noted. Pt's husband present during session and provided safe guarding technique.   Stairs Stairs: Yes Stairs assistance: Min assist;+2 safety/equipment Stair Management: One rail Left;Step to pattern;Forwards;With crutches Number of Stairs: 6 (2x3) General stair comments: Pt with good recall for safe sequencing and no overt LOB noted. Therapist provided cues on first bout of stairs and pt/spouse able to communicate for stair negotiation without cues on second bout.    Wheelchair Mobility    Modified Rankin (Stroke Patients Only)       Balance Overall balance assessment: Needs assistance Sitting-balance support: Feet supported Sitting balance-Leahy Scale: Good     Standing balance support: During functional activity;Bilateral upper extremity supported Standing balance-Leahy Scale: Poor Standing balance comment: due to poor Rt LE strength and reduced sensation           Cognition Arousal/Alertness: Awake/alert Behavior During Therapy: WFL for tasks assessed/performed Overall Cognitive Status: Within Functional Limits for  tasks assessed             Exercises Total Joint Exercises Ankle Circles/Pumps: AROM;Both;10  reps;Supine Quad Sets: AROM;Right;Other reps (comment);Supine (3) Short Arc Quad: AROM;Left;Other reps (comment);Supine (2) Heel Slides: AROM;Right;Other reps (comment);Supine;AAROM (3) Hip ABduction/ADduction: AROM;Right;5 reps;Supine Straight Leg Raises: AROM;Left;Other reps (comment);Supine (1) Long Arc Quad: AROM (demonstration) Knee Flexion: AROM;AAROM (demonstration)    General Comments        Pertinent Vitals/Pain Pain Assessment: 0-10 Pain Score: 8  Pain Location: Rt knee Pain Descriptors / Indicators: Aching;Discomfort Pain Intervention(s): Limited activity within patient's tolerance;Monitored during session;Repositioned;Ice applied    Home Living Family/patient expects to be discharged to:: Private residence Living Arrangements: Spouse/significant other;Children Available Help at Discharge: Family Type of Home: House Home Access: Stairs to enter Entrance Stairs-Rails: Left;Can reach both (Left in garage, could drive up the yard to porch (2 rails)) Home Layout: One level Home Equipment: Environmental consultant - 2 wheels Additional Comments: pts husband took 1 week off and her neice will be helping her the following week    Prior Function Level of Independence: Independent          PT Goals (current goals can now be found in the care plan section) Acute Rehab PT Goals Patient Stated Goal: to get home PT Goal Formulation: With patient Time For Goal Achievement: 11/16/19 Potential to Achieve Goals: Good Progress towards PT goals: Progressing toward goals    Frequency    7X/week      PT Plan Current plan remains appropriate       AM-PAC PT "6 Clicks" Mobility   Outcome Measure  Help needed turning from your back to your side while in a flat bed without using bedrails?: A Little Help needed moving from lying on your back to sitting on the side of a flat bed without using bedrails?: A Little Help needed moving to and from a bed to a chair (including a wheelchair)?: A  Little Help needed standing up from a chair using your arms (e.g., wheelchair or bedside chair)?: A Little Help needed to walk in hospital room?: A Little Help needed climbing 3-5 steps with a railing? : A Little 6 Click Score: 18    End of Session Equipment Utilized During Treatment: Gait belt;Right knee immobilizer Activity Tolerance: Patient tolerated treatment well Patient left: in bed;with call bell/phone within reach Nurse Communication: Mobility status PT Visit Diagnosis: Muscle weakness (generalized) (M62.81);Difficulty in walking, not elsewhere classified (R26.2);Other symptoms and signs involving the nervous system (R29.898)     Time: 4665-9935 PT Time Calculation (min) (ACUTE ONLY): 43 min  Charges:  $Gait Training: 8-22 mins $Therapeutic Exercise: 8-22 mins $Self Care/Home Management: 8-22                     Verner Mould, DPT Acute Rehabilitation Services  Office [phone] Pager [phone]  11/09/2019 6:16 PM

## 2019-11-09 NOTE — Anesthesia Postprocedure Evaluation (Signed)
Anesthesia Post Note  Patient: Jessica Roberson  Procedure(s) Performed: TOTAL KNEE ARTHROPLASTY (Right Knee)     Patient location during evaluation: PACU Anesthesia Type: Spinal Level of consciousness: oriented and awake and alert Pain management: pain level controlled Vital Signs Assessment: post-procedure vital signs reviewed and stable Respiratory status: spontaneous breathing, respiratory function stable and nonlabored ventilation Cardiovascular status: blood pressure returned to baseline and stable Postop Assessment: no headache, no backache, no apparent nausea or vomiting, spinal receding and patient able to bend at knees Anesthetic complications: no   No complications documented.  Last Vitals:  Vitals:   11/09/19 1215 11/09/19 1230  BP: (!) 142/96 (!) 155/86  Pulse: 64 (!) 52  Resp: (!) 8 14  Temp:    SpO2: 100% 100%    Last Pain:  Vitals:   11/09/19 1230  TempSrc:   PainSc: 3                  Victory Dresden A.

## 2019-11-09 NOTE — Transfer of Care (Signed)
Immediate Anesthesia Transfer of Care Note  Patient: Jessica Roberson  Procedure(s) Performed: TOTAL KNEE ARTHROPLASTY (Right Knee)  Patient Location: PACU  Anesthesia Type:Spinal  Level of Consciousness: drowsy and patient cooperative  Airway & Oxygen Therapy: Patient Spontanous Breathing and Patient connected to face mask oxygen  Post-op Assessment: Report given to RN and Post -op Vital signs reviewed and stable  Post vital signs: Reviewed and stable  Last Vitals:  Vitals Value Taken Time  BP 132/78 11/09/19 1121  Temp    Pulse 82 11/09/19 1124  Resp 16 11/09/19 1124  SpO2 100 % 11/09/19 1124  Vitals shown include unvalidated device data.  Last Pain:  Vitals:   11/09/19 0807  TempSrc:   PainSc: 0-No pain         Complications: No complications documented.

## 2019-11-09 NOTE — Anesthesia Procedure Notes (Signed)
Anesthesia Regional Block: Adductor canal block   Pre-Anesthetic Checklist: ,, timeout performed, Correct Patient, Correct Site, Correct Laterality, Correct Procedure, Correct Position, site marked, Risks and benefits discussed,  Surgical consent,  Pre-op evaluation,  At surgeon's request and post-op pain management  Laterality: Right  Prep: chloraprep       Needles:   Needle Type: Echogenic Stimulator Needle     Needle Length: 9cm  Needle Gauge: 21   Needle insertion depth: 9 cm   Additional Needles:   Procedures:,,,, ultrasound used (permanent image in chart),,,,  Narrative:  Start time: 11/09/2019 7:48 AM End time: 11/09/2019 7:53 AM Injection made incrementally with aspirations every 5 mL.  Performed by: Personally  Anesthesiologist: Josephine Igo, MD  Additional Notes: Timeout performed. Patient sedated. Relevant anatomy ID'd using [place]. Incremental 2-69ml injection of LA with frequent aspiration. Patient tolerated procedure well.        Right Adductor Canal Block

## 2019-11-09 NOTE — Evaluation (Signed)
Physical Therapy Evaluation Patient Details Name: Jessica Roberson MRN: [id number] DOB: Jan 25, 1981 Today's Date: 11/09/2019   History of Present Illness  Patient is 39 y.o. female s/p Rt TKA on 11/09/19 with PMH significant for HTN, MS, depression, OA, anxiety.  Clinical Impression  Jessica Roberson is a 39 y.o. female POD 0 s/p Rt TKA. Patient reports independence with mobility at baseline. Patient is now limited by functional impairments (see PT problem list below) and requires min assist/gaurd for transfers and gait with RW. Patient was able to ambulate ~120 feet with RW and min guard and cues for safe walker management. Patient initiate stair training with SPC and knee immobilizer and required min assist +2 for safety to negotiate 3 stairs. Patient limited this session by Rt LE weakness with 1/5 strength for quad and ankle dorsiflexion. She required knee immobilizer for safety and was reliant on UE use with RW to prevent Rt knee buckling in all weight bearing activities. Patient will benefit from continued skilled PT interventions to address impairments and progress towards PLOF. Patient has 14 stairs to enter home and will require additional Acute PT session with family training to ensure safety with discharge home.      Follow Up Recommendations Follow surgeon's recommendation for DC plan and follow-up therapies    Equipment Recommendations       Recommendations for Other Services       Precautions / Restrictions Precautions Precautions: Fall Precaution Comments: Rt knee immobilizer used due to poor quad activation Required Braces or Orthoses: Knee Immobilizer - Right Restrictions Weight Bearing Restrictions: No      Mobility  Bed Mobility Overal bed mobility: Needs Assistance Bed Mobility: Supine to Sit;Sit to Supine     Supine to sit: Min guard;Min assist;HOB elevated Sit to supine: Min guard;HOB elevated   General bed mobility comments: assist to bring Rt LE off EOB.  Pt instructed on use of gait belt to raise LE into bed and able to complete with min guard.   Transfers Overall transfer level: Needs assistance Equipment used: Rolling walker (2 wheeled) Transfers: Sit to/from [facility] Sit to Stand: Min assist;Min guard Stand pivot transfers: Min guard;Min assist       General transfer comment: cues for safe technique, pt with good UE use to prevent WB on Rt LE to prevent buckling (knee immobilizer in place). pt able to move bed <>BSC with min assist to manage walker 1st time and min guard second.   Ambulation/Gait Ambulation/Gait assistance: Min guard Gait Distance (Feet): 120 Feet Assistive device: Rolling walker (2 wheeled) Gait Pattern/deviations: Step-to pattern;Decreased stride length;Decreased weight shift to right;Decreased stance time - right Gait velocity: decr   General Gait Details: pt with good use of UE's to prevent WB on Rt LE and prevent buckling. Pt in knee immobilizer. VC's for step pattern and safe proximity. pt maintained throughout with no overt LOB.  Stairs Stairs: Yes Stairs assistance: Min assist;+2 safety/equipment Stair Management: One rail Left;With cane;Step to pattern;Forwards Number of Stairs: 3 General stair comments: VC's for safe step pattern "up wtih good, down with bad" and for safe sequencing of SPC position. min assist to steady, 2 person assist for safety.  Wheelchair Mobility    Modified Rankin (Stroke Patients Only)       Balance Overall balance assessment: Needs assistance Sitting-balance support: Feet supported Sitting balance-Leahy Scale: Good     Standing balance support: During functional activity;Bilateral upper extremity supported Standing balance-Leahy Scale: Poor Standing balance comment: due  to poor Rt LE strength and reduced sensation              Pertinent Vitals/Pain Pain Assessment: 0-10 Pain Score: 5  Pain Location: Rt knee Pain Descriptors / Indicators:  Aching;Discomfort Pain Intervention(s): Limited activity within patient's tolerance;Monitored during session;Repositioned    Home Living Family/patient expects to be discharged to:: Private residence Living Arrangements: Spouse/significant other;Children Available Help at Discharge: Family Type of Home: House Home Access: Stairs to enter Entrance Stairs-Rails: Left;Can reach both (Left in garage, could drive up the yard to porch (2 rails)) Technical brewer of Steps: 14 Home Layout: One level Home Equipment: Environmental consultant - 2 wheels Additional Comments: pts husband took 1 week off and her neice will be helping her the following week    Prior Function Level of Independence: Independent               Hand Dominance   Dominant Hand: Right    Extremity/Trunk Assessment   Upper Extremity Assessment Upper Extremity Assessment: Overall WFL for tasks assessed    Lower Extremity Assessment Lower Extremity Assessment: RLE deficits/detail RLE Deficits / Details: pt with very limited strength throughout. no quad activation and no ankle dorsiflexion. RLE Sensation:  (pt reports decreased sensation) RLE Coordination: decreased gross motor       Communication   Communication: No difficulties  Cognition Arousal/Alertness: Awake/alert Behavior During Therapy: WFL for tasks assessed/performed Overall Cognitive Status: Within Functional Limits for tasks assessed             General Comments      Exercises     Assessment/Plan    PT Assessment Patient needs continued PT services  PT Problem List Decreased strength;Decreased activity tolerance;Decreased range of motion;Decreased balance;Decreased mobility;Decreased knowledge of use of DME;Decreased knowledge of precautions;Obesity;Impaired sensation;Pain       PT Treatment Interventions DME instruction;Gait training;Stair training;Functional mobility training;Therapeutic activities;Therapeutic exercise;Balance  training;Patient/family education    PT Goals (Current goals can be found in the Care Plan section)  Acute Rehab PT Goals Patient Stated Goal: to get home PT Goal Formulation: With patient Time For Goal Achievement: 11/16/19 Potential to Achieve Goals: Good    Frequency 7X/week   Barriers to discharge Inaccessible home environment pt has 14 stairs to enter her home from both entrances.       AM-PAC PT "6 Clicks" Mobility  Outcome Measure Help needed turning from your back to your side while in a flat bed without using bedrails?: A Little Help needed moving from lying on your back to sitting on the side of a flat bed without using bedrails?: A Little Help needed moving to and from a bed to a chair (including a wheelchair)?: A Little Help needed standing up from a chair using your arms (e.g., wheelchair or bedside chair)?: A Little Help needed to walk in hospital room?: A Little Help needed climbing 3-5 steps with a railing? : A Little 6 Click Score: 18    End of Session Equipment Utilized During Treatment: Gait belt;Right knee immobilizer Activity Tolerance: Patient tolerated treatment well Patient left: in bed;with call bell/phone within reach Nurse Communication: Mobility status PT Visit Diagnosis: Muscle weakness (generalized) (M62.81);Difficulty in walking, not elsewhere classified (R26.2);Other symptoms and signs involving the nervous system (R29.898)    Time: 1638-4665 PT Time Calculation (min) (ACUTE ONLY): 42 min   Charges:   PT Evaluation $PT Eval Moderate Complexity: 1 Mod PT Treatments $Gait Training: 8-22 mins $Therapeutic Activity: 8-22 mins  Verner Mould, DPT Acute Rehabilitation Services  Office [phone] Pager [phone]  11/09/2019 4:15 PM

## 2019-11-09 NOTE — H&P (Signed)
Jessica Roberson MRN:  [id number] DOB/SEX:  Nov 02, 1980/female  CHIEF COMPLAINT:  Painful right Knee  HISTORY: Patient is a 39 y.o. female presented with a history of pain in the right knee. Onset of symptoms was gradual starting a few years ago with gradually worsening course since that time. Patient has been treated conservatively with over-the-counter NSAIDs and activity modification. Patient currently rates pain in the knee at 10 out of 10 with activity. There is pain at night.  PAST MEDICAL HISTORY: Patient Active Problem List   Diagnosis Date Noted  . COVID-19 07/01/2019  . Insomnia 07/01/2019  . Myalgia 07/01/2019  . Neck pain 09/22/2018  . Cervicogenic headache 09/22/2018  . Genetic testing 05/16/2017  . Family history of breast cancer   . Family history of leukemia   . Family history of thyroid cancer   . Family history of prostate cancer   . Family history of breast cancer in first degree relative 03/19/2017  . Family history of ovarian cancer 03/19/2017  . Family history of uterine cancer 03/19/2017  . Family history of stomach cancer 03/19/2017  . Pre-syncope 08/21/2016  . Other fatigue 03/01/2016  . Vitamin D deficiency 08/30/2015  . Foot drop, right 08/30/2015  . Optic neuritis 03/23/2015  . Gait disturbance 09/27/2014  . Numbness 09/27/2014  . High risk medication use 09/27/2014  . Carpal tunnel syndrome 10/02/2013  . Ganglion cyst of wrist 10/02/2013  . Hand paresthesia 10/02/2013  . Knee pain 08/11/2013  . Morbid obesity ([place]) 04/02/2013  . Previous cesarean section 04/02/2013  . Sterilization 04/02/2013  . Pregnancy 04/02/2013  . Encounter for postoperative care 04/02/2013  . POLYCYSTIC OVARIAN DISEASE 06/05/2010  . Sinusitis, chronic 06/02/2010  . HIRSUTISM 05/12/2010  . ANXIETY 03/31/2010  . HYPERSOMNIA, ASSOCIATED WITH SLEEP APNEA 12/20/2009  . HYPERGLYCEMIA 12/09/2009  . GENITAL HERPES 11/23/2009  . HERPES SIMPLEX INFECTION, TYPE I 11/23/2009  .  VITAMIN D DEFICIENCY 11/23/2009  . GERD 11/08/2009  . DEPRESSION, MILD 10/31/2009  . Multiple sclerosis ([place]) 10/31/2009  . MIGRAINES, HX OF 10/31/2009  . MORBID OBESITY 09/27/2009  . PERSONAL HX COLONIC POLYPS 09/27/2009  . Abdominal pain 09/16/2009  . Allergic rhinitis 11/29/2008  . Arthropathia 11/16/2008  . Common wart 11/04/2008  . Adiposity 10/26/2008   Past Medical History:  Diagnosis Date  . Allergy   . Anal fissure   . Anxiety   . Arthritis    right knee  . Colon polyp 2005  . Depression    resolved - situational  . Family history of breast cancer   . Family history of leukemia   . Family history of ovarian cancer   . Family history of prostate cancer   . Family history of thyroid cancer   . Migraine    history - otc med prn  . Multiple sclerosis ([place])   . Multiple sclerosis ([place])   . Neuromuscular disorder ([place])    Multiple sclerosis - no meds with 2014 pregnancy  . Obesity   . PCOS (polycystic ovarian syndrome)   . Pregnancy induced hypertension 03/2007   Resolved - [place] with 2008 pregnancy   . Ulcer 09/2009  . Vision abnormalities    Past Surgical History:  Procedure Laterality Date  . carpel tunnel surery Left   . CESAREAN SECTION  2008   wh  . CESAREAN SECTION N/A 04/02/2013   Procedure: REPEAT CESAREAN SECTION;  Surgeon: Eldred Manges, MD;  Location: [facility];  Service: Obstetrics;  Laterality: N/A;  . CHOLECYSTECTOMY  2007  . DILATION AND CURETTAGE OF UTERUS    . DILATION AND EVACUATION  05/05/2012   Procedure: DILATATION AND EVACUATION;  Surgeon: Alwyn Pea, MD;  Location: [facility];  Service: Gynecology;  Laterality: N/A;  . FOOT SURGERY  [id number]   x 2 right foot  . FOOT TENDON TRANSFER  2004  . [place]  2011  . HERNIA REPAIR  1984  . KNEE ARTHROSCOPY     x 3 - right knee     MEDICATIONS:   Facility-Administered Medications Prior to Admission  Medication Dose Route Frequency Provider Last Rate Last Admin  . meclizine  (ANTIVERT) tablet 25 mg  25 mg Oral TID PRN Garvin Fila, MD       Medications Prior to Admission  Medication Sig Dispense Refill Last Dose  . COLLAGEN PO Take 400 mg by mouth daily.   Past Month at Unknown time  . folic acid (FOLVITE) 1 MG tablet Take 1 mg by mouth daily.   Past Month at Unknown time  . LORazepam (ATIVAN) 1 MG tablet TAKE 1 TABLET (1 MG TOTAL) BY MOUTH AT BEDTIME. (Patient taking differently: Take 1 mg by mouth at bedtime as needed for sleep. ) 30 tablet 5 11/08/2019 at Unknown time  . pyridoxine (B-6) 100 MG tablet Take 100 mg by mouth daily.   Past Month at Unknown time  . tiZANidine (ZANAFLEX) 4 MG tablet TAKE 1 TABLET BY MOUTH 3 TIMES A DAY FOR MUSCLE SPASMS (Patient taking differently: Take 4 mg by mouth at bedtime. ) 270 tablet 3 11/08/2019 at Unknown time  . Alemtuzumab (LEMTRADA) 12 MG/1.2ML SOLN Inject into the vein.       ALLERGIES:   Allergies  Allergen Reactions  . Aspirin Anaphylaxis  . Penicillins Anaphylaxis  . Cyclobenzaprine Itching    Bad dreams  . Hydromorphone Hcl Itching  . Tramadol Hcl Hives    REVIEW OF SYSTEMS:  Pertinent items are noted in HPI.   FAMILY HISTORY:   Family History  Problem Relation Age of Onset  . Stroke Mother   . Kidney disease Mother        ESRD  . Multiple sclerosis Mother   . Depression Mother   . Breast cancer Mother 25  . Ovarian cancer Mother 60       maybe ut?  . Thyroid cancer Mother 71       reports it was fairly 'devastating, possibly aggresive one'  . Bone cancer Mother 27       unsure if a separate primary or met  . Hypertension Father   . Diabetes Father        type II  . Sarcoidosis Father   . Asthma Daughter   . Hypertension Maternal Grandmother   . Diabetes Maternal Grandmother   . Bone cancer Maternal Grandmother 65  . Diabetes Paternal Grandfather   . Alzheimer's disease Paternal Grandfather   . Breast cancer Other 50  . Prostate cancer Maternal Uncle 65       no surgery  . Lung  cancer Maternal Grandfather 33  . Leukemia Paternal Grandmother 9       died at 32  . Breast cancer Cousin 37    SOCIAL HISTORY:   Social History   Tobacco Use  . Smoking status: Never Smoker  . Smokeless tobacco: Never Used  Substance Use Topics  . Alcohol use: Not Currently    Comment: occasional but none with pregnancy     EXAMINATION:  Vital signs in last 24 hours: Temp:  [98 F (36.7 C)] 98 F (36.7 C) (07/19 0641) Pulse Rate:  [79] 79 (07/19 0641) Resp:  [16] 16 (07/19 0641) BP: (159)/(118) 159/118 (07/19 0641) SpO2:  [98 %] 98 % (07/19 0641)  BP (!) 159/118   Pulse 79   Temp 98 F (36.7 C) (Oral)   Resp 16   LMP 10/25/2019   SpO2 98%   General Appearance:    Alert, cooperative, no distress, appears stated age  Head:    Normocephalic, without obvious abnormality, atraumatic  Eyes:    PERRL, conjunctiva/corneas clear, EOM's intact, fundi    benign, both eyes  Ears:    Normal TM's and external ear canals, both ears  Nose:   Nares normal, septum midline, mucosa normal, no drainage    or sinus tenderness  Throat:   Lips, mucosa, and tongue normal; teeth and gums normal  Neck:   Supple, symmetrical, trachea midline, no adenopathy;    thyroid:  no enlargement/tenderness/nodules; no carotid   bruit or JVD  Back:     Symmetric, no curvature, ROM normal, no CVA tenderness  Lungs:     Clear to auscultation bilaterally, respirations unlabored  Chest Wall:    No tenderness or deformity   Heart:    Regular rate and rhythm, S1 and S2 normal, no murmur, rub   or gallop  Breast Exam:    No tenderness, masses, or nipple abnormality  Abdomen:     Soft, non-tender, bowel sounds active all four quadrants,    no masses, no organomegaly  Genitalia:    Normal female without lesion, discharge or tenderness  Rectal:    Normal tone, no masses or tenderness;   guaiac negative stool  Extremities:   Extremities normal, atraumatic, no cyanosis or edema  Pulses:   2+ and symmetric  all extremities  Skin:   Skin color, texture, turgor normal, no rashes or lesions  Lymph nodes:   Cervical, supraclavicular, and axillary nodes normal  Neurologic:   CNII-XII intact, normal strength, sensation and reflexes    throughout    Musculoskeletal:  ROM 0-120, Ligaments intact,  Imaging Review Plain radiographs demonstrate severe degenerative joint disease of the left knee. The overall alignment is neutral. The bone quality appears to be good for age and reported activity level.  Assessment/Plan: Primary osteoarthritis, left knee   The patient history, physical examination and imaging studies are consistent with advanced degenerative joint disease of the left knee. The patient has failed conservative treatment.  The clearance notes were reviewed.  After discussion with the patient it was felt that Total Knee Replacement was indicated. The procedure,  risks, and benefits of total knee arthroplasty were presented and reviewed. The risks including but not limited to aseptic loosening, infection, blood clots, vascular injury, stiffness, patella tracking problems complications among others were discussed. The patient acknowledged the explanation, agreed to proceed with the plan.  Preoperative templating of the joint replacement has been completed, documented, and submitted to the Operating Room personnel in order to optimize intra-operative equipment management.    Patient's anticipated LOS is less than 2 midnights, meeting these requirements: - Younger than 67 - Lives within 1 hour of care - Has a competent adult at home to recover with post-op recover - NO history of  - Chronic pain requiring opiods  - Diabetes  - Coronary Artery Disease  - Heart failure  - Heart attack  - Stroke  - DVT/VTE  - Cardiac arrhythmia  -  Respiratory Failure/COPD  - Renal failure  - Anemia  - Advanced Liver disease       Donia Ast 11/09/2019, 6:59 AM

## 2019-11-09 NOTE — Anesthesia Procedure Notes (Signed)
Spinal  Patient location during procedure: OR Start time: 11/09/2019 8:45 AM End time: 11/09/2019 8:45 AM Staffing Performed: anesthesiologist  Anesthesiologist: Josephine Igo, MD Resident/CRNA: Montel Clock, CRNA Preanesthetic Checklist Completed: patient identified, IV checked, risks and benefits discussed, surgical consent, monitors and equipment checked, pre-op evaluation and timeout performed Spinal Block Patient position: sitting Prep: DuraPrep Patient monitoring: heart rate, cardiac monitor, continuous pulse ox and blood pressure Approach: midline Location: L3-4 Injection technique: single-shot Needle Needle type: Pencan  Needle gauge: 24 G Needle length: 10 cm Needle insertion depth: 9 cm Assessment Sensory level: T6 Additional Notes First attempt by CRNA unsuccessful. Second attempt by Dr. Royce Macadamia, negative heme/parasthesia, positive CSF pre and post injection.

## 2019-11-11 ENCOUNTER — Encounter (HOSPITAL_COMMUNITY): Payer: Self-pay | Admitting: Orthopedic Surgery

## 2019-11-12 NOTE — Op Note (Signed)
TOTAL KNEE REPLACEMENT OPERATIVE NOTE:  11/09/2019  7:49 AM  PATIENT:  Jessica Roberson  39 y.o. female  PRE-OPERATIVE DIAGNOSIS:  Osteoarthritis right knee  POST-OPERATIVE DIAGNOSIS:  Osteoarthritis right knee  PROCEDURE:  Procedure(s): TOTAL KNEE ARTHROPLASTY  SURGEON:  Surgeon(s): Vickey Huger, MD  PHYSICIAN ASSISTANT:  Nehemiah Massed, PA-C  ANESTHESIA:   spinal  SPECIMEN: None  COUNTS:  Correct  TOURNIQUET:   Total Tourniquet Time Documented: Thigh (Right) - 84 minutes Total: Thigh (Right) - 84 minutes   DICTATION:  Indication for procedure:    The patient is a 39 y.o. female who has failed conservative treatment for Osteoarthritis right knee.  Informed consent was obtained prior to anesthesia. The risks versus benefits of the operation were explain and in a way the patient can, and did, understand.    Description of procedure:     The patient was taken to the operating room and placed under anesthesia.  The patient was positioned in the usual fashion taking care that all body parts were adequately padded and/or protected.  A tourniquet was applied and the leg prepped and draped in the usual sterile fashion.  The extremity was exsanguinated with the esmarch and tourniquet inflated to 350 mmHg.  Pre-operative range of motion was normal.  The knee was in 6 degree of mild varus.  A midline incision approximately 6-7 inches long was made with a #10 blade.  A new blade was used to make a parapatellar arthrotomy going 2-3 cm into the quadriceps tendon, over the patella, and alongside the medial aspect of the patellar tendon.  A synovectomy was then performed with the #10 blade and forceps. I then elevated the deep MCL off the medial tibial metaphysis subperiosteally around to the semimembranosus attachment.    I everted the patella and used calipers to measure patellar thickness.  I used the reamer to ream down to appropriate thickness to recreate the native thickness.  I  then removed excess bone with the rongeur and sagittal saw.  I used the appropriately sized template and drilled the three lug holes.  I then put the trial in place and measured the thickness with the calipers to ensure recreation of the native thickness.  The trial was then removed and the patella subluxed and the knee brought into flexion.  A homan retractor was place to retract and protect the patella and lateral structures.  A Z-retractor was place medially to protect the medial structures.  The extra-medullary alignment system was used to make cut the tibial articular surface perpendicular to the anamotic axis of the tibia and in 3 degrees of posterior slope.  The cut surface and alignment jig was removed.  I then used the intramedullary alignment guide to make a  valgus cut on the distal femur.  I then marked out the epicondylar axis on the distal femur.  I then used the anterior referencing sizer and measured the femur to be a size 6.  The 4-In-1 cutting block was screwed into place in external rotation matching the posterior condylar angle, making our cuts perpendicular to the epicondylar axis.  Anterior, posterior and chamfer cuts were made with the sagittal saw.  The cutting block and cut pieces were removed.  A lamina spreader was placed in 90 degrees of flexion.  The ACL, PCL, menisci, and posterior condylar osteophytes were removed.  A 14 mm spacer blocked was found to offer good flexion and extension gap balance after minimal in degree releasing.   The scoop retractor  was then placed and the femoral finishing block was pinned in place.  The small sagittal saw was used as well as the lug drill to finish the femur.  The block and cut surfaces were removed and the medullary canal hole filled with autograft bone from the cut pieces.  The tibia was delivered forward in deep flexion and external rotation.  A size C tray was selected and pinned into place centered on the medial 1/3 of the tibial  tubercle.  The reamer and keel was used to prepare the tibia through the tray.    I then trialed with the size 6 femur, size C tibia, a 14 mm insert and the 32 patella.  I had excellent flexion/extension gap balance, excellent patella tracking.  Flexion was full and beyond 120 degrees; extension was zero.  These components were chosen and the staff opened them to me on the back table while the knee was lavaged copiously and the cement mixed.  The soft tissue was infiltrated with 60cc of exparel 1.3% through a 21 gauge needle.  I cemented in the components and removed all excess cement.  The polyethylene tibial component was snapped into place and the knee placed in extension while cement was hardening.  The capsule was infilltrated with a 60cc exparel/marcaine/saline mixture.   Once the cement was hard, the tourniquet was let down.  Hemostasis was obtained.  The arthrotomy was closed using a #1 stratofix running suture.  The deep soft tissues were closed with #0 vicryls and the subcuticular layer closed with #2-0 vicryl.  The skin was reapproximated and closed with 3.0 Monocryl.  The wound was covered with steristrips, aquacel dressing, and a TED stocking.   The patient was then awakened, extubated, and taken to the recovery room in stable condition.  BLOOD LOSS:  498YM COMPLICATIONS:  None.  PLAN OF CARE: Discharge to home after PACU  PATIENT DISPOSITION:  PACU - hemodynamically stable.    Please fax a copy of this op note to my office at [phone] (please only include page 1 and 2 of the Case Information op note)

## 2019-12-24 ENCOUNTER — Encounter: Payer: Self-pay | Admitting: Neurology

## 2020-01-04 ENCOUNTER — Ambulatory Visit (INDEPENDENT_AMBULATORY_CARE_PROVIDER_SITE_OTHER): Payer: BLUE CROSS/BLUE SHIELD | Admitting: Neurology

## 2020-01-04 ENCOUNTER — Telehealth: Payer: Self-pay | Admitting: Neurology

## 2020-01-04 ENCOUNTER — Encounter: Payer: Self-pay | Admitting: Neurology

## 2020-01-04 ENCOUNTER — Other Ambulatory Visit: Payer: Self-pay

## 2020-01-04 VITALS — BP 136/90 | HR 64 | Ht 69.0 in | Wt 269.0 lb

## 2020-01-04 DIAGNOSIS — R5383 Other fatigue: Secondary | ICD-10-CM | POA: Diagnosis not present

## 2020-01-04 DIAGNOSIS — R269 Unspecified abnormalities of gait and mobility: Secondary | ICD-10-CM

## 2020-01-04 DIAGNOSIS — M21371 Foot drop, right foot: Secondary | ICD-10-CM | POA: Diagnosis not present

## 2020-01-04 DIAGNOSIS — Z79899 Other long term (current) drug therapy: Secondary | ICD-10-CM | POA: Diagnosis not present

## 2020-01-04 DIAGNOSIS — G35 Multiple sclerosis: Secondary | ICD-10-CM | POA: Diagnosis not present

## 2020-01-04 NOTE — Telephone Encounter (Signed)
[employer] Auth: [id number] (exp. 01/04/20 to 02/02/20)/[facility] pending uploaded notes

## 2020-01-04 NOTE — Progress Notes (Signed)
[facility]  PATIENT: Jessica Roberson DOB: 1980-09-13  REFERRING DOCTOR OR PCP:  Debbrah Alar SOURCE: patient  _________________________________   HISTORICAL  CHIEF COMPLAINT:  Chief Complaint  Patient presents with  . Follow-up    Room 12 . She has completed both rounds of [place] and is still getting her monthly labs. She has no new concerns today. She had a right total knee replacement on 11/09/19 and is doing well with her recovery. She has been fully vaccinated with [employer].     HISTORY OF PRESENT ILLNESS:  Jessica Roberson is a 39 y.o. woman with MS .   Update 01/04/2020: She feels the MS is stable and she notes no exacerbation.    She had Lemtrada in October 2018 and October 2019 and continues with the REMS program.   Her WBC has been low (recently 3.2) but lymphocytes ok (low normal 1.0).   She feels gait and strength are doing better since her TKR.  She has a mild right foot drop, especially in hot weather outdoors and when tired.     She has mild right leg and neck muscle spasms.  She has some pain in the right arm when the neck stiffens.   She has mild fatigue.   She sleeps well most nights.  Mood is sometimes low and she is irritable easily.  Cognition is fine.  She works from home.   She may have to start traveling soon.  She had Covid early February 2021.  She had symptoms.   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    Her kids are 6 and 12.   They are back in school.       .    MS History:   She was diagnosed with multiple sclerosis in 2006 after presenting with left facial numbness and vision changes. MRIs of the brain were consistent with MS. She also had a lumbar puncture in the spinal fluid was consistent with MS. She initially saw a Dr. at [facility] neurolog[facility] and then began to see Dr. [name] at [facility].   She was placed on Betaseron but switched to Copaxone due to injection site reactions. Unfortunately, she also had injection site  reactions on Copaxone. Around 2010, she was started on Tysabri.  Due to a concern about PML, she wished to switch therapy. She was screened for a drug study but could not get the MRI's due to braces. She started Gilenya in 2012. Done well on that therapy she stopped twice, once for insurance reasons and once for pregnancy and resumed therapy after each pause. She has not had any definite exacerbations while on Gilenya. She tolerates it very well. She had optic neuritis in 2017.   MRI 08/2016 showed a new focus not present in 2016.  She was switched to [place].  She had the first year October 2018 and the second year October 2019.   Imaging: MRI cervical spine 01/16/2018 shows "At C4 level, there is a posterior spinal cord chronic demyelinating plaque.  No acute plaques"  MRI brain 01/16/2018 shows "Multiple supratentorial and infratentorial chronic demyelinating plaques. No acute plaques. No change from MRI on 09/30/14"     REVIEW OF SYSTEMS: Constitutional: No fevers, chills, sweats, or change in appetite.  She notes a lot more fatigue. Eyes: see above Ear, nose and throat: No hearing loss, ear pain, nasal congestion, sore throat Cardiovascular: No chest pain, palpitations Respiratory: No shortness of breath at rest or with exertion.   No  wheezes GastrointestinaI: No nausea, vomiting, diarrhea, abdominal pain, fecal incontinence Genitourinary: No dysuria, urinary retention or frequency.  No nocturia. Musculoskeletal:She reports neck pain and some muscle aches Integumentary: No rash, pruritus, skin lesions Neurological: as above Psychiatric: No depression at this time.  No anxiety Endocrine: No palpitations, diaphoresis, change in appetite, change in weigh or increased thirst Hematologic/Lymphatic: No anemia, purpura, petechiae. Allergic/Immunologic: No itchy/runny eyes, nasal congestion, recent allergic reactions, rashes  ALLERGIES: Allergies  Allergen Reactions  . Aspirin Anaphylaxis   . Penicillins Anaphylaxis  . Cyclobenzaprine Itching    Bad dreams  . Hydromorphone Hcl Itching  . Tramadol Hcl Hives    HOME MEDICATIONS:  Current Outpatient Medications:  .  COLLAGEN PO, Take 400 mg by mouth daily., Disp: , Rfl:  .  folic acid (FOLVITE) 1 MG tablet, Take 1 mg by mouth daily., Disp: , Rfl:  .  LORazepam (ATIVAN) 1 MG tablet, TAKE 1 TABLET (1 MG TOTAL) BY MOUTH AT BEDTIME. (Patient taking differently: Take 1 mg by mouth at bedtime as needed for sleep. ), Disp: 30 tablet, Rfl: 5 .  pyridoxine (B-6) 100 MG tablet, Take 100 mg by mouth daily., Disp: , Rfl:   PAST MEDICAL HISTORY: Past Medical History:  Diagnosis Date  . Allergy   . Anal fissure   . Anxiety   . Arthritis    right knee  . Colon polyp 2005  . Depression    resolved - situational  . Family history of breast cancer   . Family history of leukemia   . Family history of ovarian cancer   . Family history of prostate cancer   . Family history of thyroid cancer   . Migraine    history - otc med prn  . Multiple sclerosis ([place])   . Multiple sclerosis ([place])   . Neuromuscular disorder ([place])    Multiple sclerosis - no meds with 2014 pregnancy  . Obesity   . PCOS (polycystic ovarian syndrome)   . Pregnancy induced hypertension 03/2007   Resolved - [place] with 2008 pregnancy   . Ulcer 09/2009  . Vision abnormalities     PAST SURGICAL HISTORY: Past Surgical History:  Procedure Laterality Date  . carpel tunnel surery Left   . CESAREAN SECTION  2008   wh  . CESAREAN SECTION N/A 04/02/2013   Procedure: REPEAT CESAREAN SECTION;  Surgeon: Eldred Manges, MD;  Location: [facility];  Service: Obstetrics;  Laterality: N/A;  . CHOLECYSTECTOMY  2007  . DILATION AND CURETTAGE OF UTERUS    . DILATION AND EVACUATION  05/05/2012   Procedure: DILATATION AND EVACUATION;  Surgeon: Alwyn Pea, MD;  Location: [facility];  Service: Gynecology;  Laterality: N/A;  . FOOT SURGERY  [id number]   x 2 right foot  . FOOT TENDON  TRANSFER  2004  . [place]  2011  . HERNIA REPAIR  1984  . KNEE ARTHROSCOPY     x 3 - right knee  . TOTAL KNEE ARTHROPLASTY Right 11/09/2019   Procedure: TOTAL KNEE ARTHROPLASTY;  Surgeon: Vickey Huger, MD;  Location: [facility];  Service: Orthopedics;  Laterality: Right;    FAMILY HISTORY: Family History  Problem Relation Age of Onset  . Stroke Mother   . Kidney disease Mother        ESRD  . Multiple sclerosis Mother   . Depression Mother   . Breast cancer Mother 41  . Ovarian cancer Mother 69       maybe ut?  Marland Kitchen  Thyroid cancer Mother 11       reports it was fairly 'devastating, possibly aggresive one'  . Bone cancer Mother 31       unsure if a separate primary or met  . Hypertension Father   . Diabetes Father        type II  . Sarcoidosis Father   . Asthma Daughter   . Hypertension Maternal Grandmother   . Diabetes Maternal Grandmother   . Bone cancer Maternal Grandmother 18  . Diabetes Paternal Grandfather   . Alzheimer's disease Paternal Grandfather   . Breast cancer Other 50  . Prostate cancer Maternal Uncle 65       no surgery  . Lung cancer Maternal Grandfather 31  . Leukemia Paternal Grandmother 62       died at 56  . Breast cancer Cousin 32    SOCIAL HISTORY:  Social History   Socioeconomic History  . Marital status: Married    Spouse name: Not on file  . Number of children: Not on file  . Years of education: Not on file  . Highest education level: Not on file  Occupational History  . Not on file  Tobacco Use  . Smoking status: Never Smoker  . Smokeless tobacco: Never Used  Vaping Use  . Vaping Use: Never used  Substance and Sexual Activity  . Alcohol use: Not Currently    Comment: occasional but none with pregnancy  . Drug use: No  . Sexual activity: Yes    Birth control/protection: None    Comment: preganat  Other Topics Concern  . 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   Lives with boyfriend   Regular exercise: yes   Social Determinants of Health   Financial Resource Strain:   . Difficulty of Paying Living Expenses: Not on file  Food Insecurity:   . Worried About Charity fundraiser in the Last Year: Not on file  . Ran Out of Food in the Last Year: Not on file  Transportation Needs:   . Lack of Transportation (Medical): Not on file  . Lack of Transportation (Non-Medical): Not on file  Physical Activity:   . Days of Exercise per Week: Not on file  . Minutes of Exercise per Session: Not on file  Stress:   . Feeling of Stress : Not on file  Social Connections:   . Frequency of Communication with Friends and Family: Not on file  . Frequency of Social Gatherings with Friends and Family: Not on file  . Attends Religious Services: Not on file  . Active Member of Clubs or Organizations: Not on file  . Attends Archivist Meetings: Not on file  . Marital Status: Not on file  Intimate Partner Violence:   . Fear of Current or Ex-Partner: Not on file  . Emotionally Abused: Not on file  . Physically Abused: Not on file  . Sexually Abused: Not on file     PHYSICAL EXAM  Vitals:   01/04/20 1108  BP: 136/90  Pulse: 64  Weight: 269 lb (122 kg)  Height: 5' 9"  (1.753 m)     Body mass index is 39.72 kg/m.   General: The patient is well-developed and well-nourished and in no acute distress.  Head and neck: The head is normocephalic and atraumatic.  The neck is tender over the right occiput, upper to mid cervical paraspinal muscles and trapezius  muscle.  Range of motion is slightly reduced.  Neurologic Exam  Mental status: The patient is alert and oriented x 3 at the time of the examination. The patient has apparent normal recent and remote memory, with an apparently normal attention span and concentration ability.   Speech is normal.  Cranial nerves:   Extraocular muscles are normal.  Facial strength is normal.  Trapezius  strength is normal.     No obvious hearing deficits are noted.  Motor:  Muscle bulk is normal.   Tone is mildly increased in right leg. Strength is  5 / 5 in the arms and left leg and proximal right leg and 4+/5 right foot/ankle..   Sensory: Sensory testing is intact proximally in the arms and legs  Coordination: She has good finger-nose-finger bilaterally.  Heel-to-shin is normal on the left and slightly reduced on the right.  Gait and station: Station is normal.   Her gait is fairly normal but the tandem gait is wide.  Romberg is negative.  Reflexes: Deep tendon reflexes are symmetric and normal bilaterally.       DIAGNOSTIC DATA (LABS, IMAGING, TESTING) - I reviewed patient records, labs, notes, testing and imaging myself where available.  Lab Results  Component Value Date   WBC 4.2 11/09/2019   HGB 13.4 11/09/2019   HCT 40.2 11/09/2019   MCV 83.4 11/09/2019   PLT 224 11/09/2019      Component Value Date/Time   NA 141 11/09/2019 1140   NA 140 08/29/2017 0858   K 5.2 (H) 11/09/2019 1140   CL 108 11/09/2019 1140   CO2 26 11/09/2019 1140   GLUCOSE 97 11/09/2019 1140   BUN 10 11/09/2019 1140   BUN 9 08/29/2017 0858   CREATININE 0.74 11/09/2019 1140   CALCIUM 9.0 11/09/2019 1140   PROT 7.3 11/09/2019 1140   PROT 6.9 01/14/2017 1438   ALBUMIN 4.1 11/09/2019 1140   ALBUMIN 4.3 01/14/2017 1438   AST 20 11/09/2019 1140   ALT 16 11/09/2019 1140   ALKPHOS 35 (L) 11/09/2019 1140   BILITOT 0.5 11/09/2019 1140   BILITOT 0.4 01/14/2017 1438   GFRNONAA >60 11/09/2019 1140   GFRAA >60 11/09/2019 1140   Lab Results  Component Value Date   CHOL 182 03/19/2017   HDL 59.20 03/19/2017   LDLCALC 110 (H) 03/19/2017   TRIG 65.0 03/19/2017   CHOLHDL 3 03/19/2017   Lab Results  Component Value Date   HGBA1C 5.2 03/19/2017  DVT Lab Results  Component Value Date   VITAMINB12 330 09/27/2009   Lab Results  Component Value Date   TSH 0.539 08/29/2017       ASSESSMENT AND  PLAN  Multiple sclerosis ([place]) - Plan: MR BRAIN W WO CONTRAST  Other fatigue  High risk medication use  Foot drop, right  Gait disturbance   1.  She has completed the first and second years of [place].  She will continue the monthly monitoring until 2023. 2.   Check MRI of the brain to determine if there is any subclinical progression.  If this is occurring we would need to add a disease modifying therapy.   3.  stay active and exercise.    4.  Rtc 5-6 months, call sooner if new or worsening symptoms or other issues.    Hanh Kertesz A. Felecia Shelling, MD, PhD 2/95/7473, 40:37 PM Certified in Neurology, Clinical Neurophysiology, Sleep Medicine, Pain Medicine and Neuroimaging  [facility] [address], [place], [place] [address] [phone]

## 2020-01-05 NOTE — Telephone Encounter (Signed)
07/08/19 BC[employer] Auth: [id number] (exp. 07/08/19 to 08/06/19 bc patient has BCBS has primary healthy blue will not do the PA order sent to GI. They will reach out to the patient to schedule.  Healthy blue states to submit the EOBs

## 2020-01-08 ENCOUNTER — Other Ambulatory Visit: Payer: Self-pay | Admitting: Neurology

## 2020-01-11 ENCOUNTER — Telehealth: Payer: Self-pay | Admitting: Neurology

## 2020-01-11 NOTE — Telephone Encounter (Signed)
Called pt back. She forgot to ask Dr. Felecia Shelling for refill last week on tizanidine. Advised we received refill request on 01/08/20 from pharmacy but we were not here on Friday. Advised we will send in refill for her. Ok per Dr. Felecia Shelling. She verbalized understanding and appreciation.

## 2020-01-11 NOTE — Telephone Encounter (Signed)
Pt called would like to discuss a refill for tiZANidine (ZANAFLEX). Would like a call from the nurse as soon as possible.

## 2020-01-12 ENCOUNTER — Other Ambulatory Visit: Payer: Self-pay | Admitting: Neurology

## 2020-01-17 ENCOUNTER — Ambulatory Visit
Admission: RE | Admit: 2020-01-17 | Discharge: 2020-01-17 | Disposition: A | Discharge status: Home / Self Care | Payer: BLUE CROSS/BLUE SHIELD | Source: Ambulatory Visit | Attending: Neurology | Admitting: Neurology

## 2020-01-17 DIAGNOSIS — G35 Multiple sclerosis: Secondary | ICD-10-CM

## 2020-01-28 ENCOUNTER — Telehealth: Payer: Self-pay | Admitting: *Deleted

## 2020-01-28 NOTE — Telephone Encounter (Signed)
Called, LVM to remind pt she needs monthly lemtrada labs for this month. Last set drawn 12/24/19. Advised her to call back if she has any further questions/concerns.

## 2020-02-03 ENCOUNTER — Encounter: Payer: Self-pay | Admitting: Neurology

## 2020-03-22 ENCOUNTER — Other Ambulatory Visit: Payer: Self-pay | Admitting: Neurology

## 2020-03-31 ENCOUNTER — Telehealth: Payer: Self-pay | Admitting: Neurology

## 2020-03-31 NOTE — Telephone Encounter (Signed)
Called pt back. States sx have been on going for the last week. She had covid-19 this past Feb. States she does not have any signs or sx of Covid. Reports her whole body hurts. Thursday last week, she did a service project with [employer] and moved a lot of cases of water. Thought this may have caused sx. She rested over the weekend but not feeling any better. Muscle spasms have worsened. Thinking this is causing her headaches. Scheduled work in visit (approved by MD) for 04/04/20 at 2:30pm with Dr. Felecia Shelling. Advised pt to check in at 2:00pm. She is going to double check at work that she has not been exposed to anyone with covid-19. She will let us know prior to appt if she has been, or develops any sx herself.

## 2020-03-31 NOTE — Telephone Encounter (Signed)
Pt called, having headaches and body aches and sensitive to touch skin since last Thursday. Would like a call from the nurse.

## 2020-04-04 ENCOUNTER — Ambulatory Visit (INDEPENDENT_AMBULATORY_CARE_PROVIDER_SITE_OTHER): Payer: BLUE CROSS/BLUE SHIELD | Admitting: Neurology

## 2020-04-04 ENCOUNTER — Encounter: Payer: Self-pay | Admitting: Neurology

## 2020-04-04 VITALS — BP 102/70 | HR 82 | Ht 69.0 in | Wt 271.5 lb

## 2020-04-04 DIAGNOSIS — R5383 Other fatigue: Secondary | ICD-10-CM

## 2020-04-04 DIAGNOSIS — G35 Multiple sclerosis: Secondary | ICD-10-CM | POA: Diagnosis not present

## 2020-04-04 DIAGNOSIS — M791 Myalgia, unspecified site: Secondary | ICD-10-CM

## 2020-04-04 DIAGNOSIS — G4489 Other headache syndrome: Secondary | ICD-10-CM

## 2020-04-04 DIAGNOSIS — M542 Cervicalgia: Secondary | ICD-10-CM

## 2020-04-04 DIAGNOSIS — Z79899 Other long term (current) drug therapy: Secondary | ICD-10-CM | POA: Diagnosis not present

## 2020-04-04 MED ORDER — ETODOLAC 400 MG PO TABS: 400.0000 mg | ORAL_TABLET | Freq: Two times a day (BID) | ORAL | 5 refills | Status: DC

## 2020-04-04 NOTE — Progress Notes (Signed)
[facility]  PATIENT: Jessica Roberson DOB: [birth year]  REFERRING DOCTOR OR PCP:  Debbrah Alar SOURCE: patient  _________________________________   HISTORICAL  CHIEF COMPLAINT:  Chief Complaint  Patient presents with  . Follow-up    RM 12, alone. Last seen 01/04/2020. Here today for the following complaints: headache/body ache/sensitive to touch since 03/23/20. Works from home. Kids going to school but tested for Covid every Monday (get results by Tuesday).    HISTORY OF PRESENT ILLNESS:  Jessica Roberson is a 39 y.o. woman with MS .   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  Pain radiates into the back of the right head.    She is getting exhausted and has needed naps lately.   She tried ibuprofen and tylenol alternating without much benefit.    She notes tizanidine helps th pain but makes her sleepy.   She feels the MS is stable and she notes no exacerbation.    She had Lemtrada in October 2018 and October 2019 and continues with the REMS program.   She is scheduled to have an  MRI in January (she went for appt but it was cancelled and rescheduled)   She feels gait and strength are doing better since her TKR.  She has a mild right foot drop, especially in hot weather outdoors and when tired.     She has mild right leg and neck muscle spasms.  She has some pain in the right arm when the neck stiffens.   She has mild fatigue.   She sleeps well most nights.  Mood is sometimes low and she is irritable easily.  Cognition is fine.  She works from home.   She may have to start traveling soon.  She had Covid early February 2021.  She had symptoms.   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    Her kids are 6 and 12.   They are back in school.         MS History:   She was diagnosed with multiple sclerosis in 2006 after presenting  with left facial numbness and vision changes. MRIs of the brain were consistent with MS. She also had a lumbar puncture in the spinal fluid was consistent with MS. She initially saw a Dr. at [facility] neurologic and then began to see Dr. [name] at [facility].   She was placed on Betaseron but switched to Copaxone due to injection site reactions. Unfortunately, she also had injection site reactions on Copaxone. Around 2010, she was started on Tysabri.  Due to a concern about PML, she wished to switch therapy. She was screened for a drug study but could not get the MRI's due to braces. She started Gilenya in 2012. Done well on that therapy she stopped twice, once for insurance reasons and once for pregnancy and resumed therapy after each pause. She has not had any definite exacerbations while on Gilenya. She tolerates it very well. She had optic neuritis in 2017.   MRI 08/2016 showed a new focus not present in 2016.  She was switched to [place].  She had the first year October 2018 and the second year October 2019.   Imaging: MRI cervical spine 01/16/2018 shows "At C4 level, there is a posterior spinal cord chronic demyelinating plaque.  No acute plaques"  MRI brain 01/16/2018 shows "Multiple supratentorial  and infratentorial chronic demyelinating plaques. No acute plaques. No change from MRI on 09/30/14"     REVIEW OF SYSTEMS: Constitutional: No fevers, chills, sweats, or change in appetite.  She notes a lot more fatigue. Eyes: see above Ear, nose and throat: No hearing loss, ear pain, nasal congestion, sore throat Cardiovascular: No chest pain, palpitations Respiratory: No shortness of breath at rest or with exertion.   No wheezes GastrointestinaI: No nausea, vomiting, diarrhea, abdominal pain, fecal incontinence Genitourinary: No dysuria, urinary retention or frequency.  No nocturia. Musculoskeletal:She reports neck pain and some muscle aches Integumentary: No rash, pruritus, skin  lesions Neurological: as above Psychiatric: No depression at this time.  No anxiety Endocrine: No palpitations, diaphoresis, change in appetite, change in weigh or increased thirst Hematologic/Lymphatic: No anemia, purpura, petechiae. Allergic/Immunologic: No itchy/runny eyes, nasal congestion, recent allergic reactions, rashes  ALLERGIES: Allergies  Allergen Reactions  . Aspirin Anaphylaxis  . Penicillins Anaphylaxis  . Cyclobenzaprine Itching    Bad dreams  . Hydromorphone Hcl Itching  . Tramadol Hcl Hives    HOME MEDICATIONS:  Current Outpatient Medications:  .  COLLAGEN PO, Take 400 mg by mouth daily., Disp: , Rfl:  .  folic acid (FOLVITE) 1 MG tablet, Take 1 mg by mouth daily., Disp: , Rfl:  .  LORazepam (ATIVAN) 1 MG tablet, Take 1 tablet (1 mg total) by mouth at bedtime as needed for sleep., Disp: 30 tablet, Rfl: 5 .  pyridoxine (B-6) 100 MG tablet, Take 100 mg by mouth daily., Disp: , Rfl:  .  tiZANidine (ZANAFLEX) 4 MG tablet, TAKE 1 TABLET BY MOUTH 3 TIMES A DAY FOR MUSCLE SPASMS, Disp: 270 tablet, Rfl: 3 .  etodolac (LODINE) 400 MG tablet, Take 1 tablet (400 mg total) by mouth 2 (two) times daily., Disp: 60 tablet, Rfl: 5  PAST MEDICAL HISTORY: Past Medical History:  Diagnosis Date  . Allergy   . Anal fissure   . Anxiety   . Arthritis    right knee  . Colon polyp 2005  . Depression    resolved - situational  . Family history of breast cancer   . Family history of leukemia   . Family history of ovarian cancer   . Family history of prostate cancer   . Family history of thyroid cancer   . Migraine    history - otc med prn  . Multiple sclerosis (HCC)   . Multiple sclerosis (HCC)   . Neuromuscular disorder (HCC)    Multiple sclerosis - no meds with 2014 pregnancy  . Obesity   . PCOS (polycystic ovarian syndrome)   . Pregnancy induced hypertension 03/2007   Resolved - PIH with 2008 pregnancy   . Ulcer 09/2009  . Vision abnormalities     PAST SURGICAL  HISTORY: Past Surgical History:  Procedure Laterality Date  . carpel tunnel surery Left   . CESAREAN SECTION  2008   wh  . CESAREAN SECTION N/A 04/02/2013   Procedure: REPEAT CESAREAN SECTION;  Surgeon: Hal Morales, MD;  Location: [facility];  Service: Obstetrics;  Laterality: N/A;  . CHOLECYSTECTOMY  2007  . DILATION AND CURETTAGE OF UTERUS    . DILATION AND EVACUATION  05/05/2012   Procedure: DILATATION AND EVACUATION;  Surgeon: Esmeralda Arthur, MD;  Location: [facility];  Service: Gynecology;  Laterality: N/A;  . FOOT SURGERY  [id number]   x 2 right foot  . FOOT TENDON TRANSFER  2004  . HAMMER TOE SURGERY  2011  . HERNIA  REPAIR  1984  . KNEE ARTHROSCOPY     x 3 - right knee  . TOTAL KNEE ARTHROPLASTY Right 11/09/2019   Procedure: TOTAL KNEE ARTHROPLASTY;  Surgeon: Vickey Huger, MD;  Location: [facility];  Service: Orthopedics;  Laterality: Right;    FAMILY HISTORY: Family History  Problem Relation Age of Onset  . Stroke Mother   . Kidney disease Mother        ESRD  . Multiple sclerosis Mother   . Depression Mother   . Breast cancer Mother 67  . Ovarian cancer Mother 67       maybe ut?  . Thyroid cancer Mother 80       reports it was fairly 'devastating, possibly aggresive one'  . Bone cancer Mother 63       unsure if a separate primary or met  . Hypertension Father   . Diabetes Father        type II  . Sarcoidosis Father   . Asthma Daughter   . Hypertension Maternal Grandmother   . Diabetes Maternal Grandmother   . Bone cancer Maternal Grandmother 73  . Diabetes Paternal Grandfather   . Alzheimer's disease Paternal Grandfather   . Breast cancer Other 50  . Prostate cancer Maternal Uncle 65       no surgery  . Lung cancer Maternal Grandfather 55  . Leukemia Paternal Grandmother 94       died at 57  . Breast cancer Cousin 12    SOCIAL HISTORY:  Social History   Socioeconomic History  . Marital status: Married    Spouse name: Not on file  . Number of children:  Not on file  . Years of education: Not on file  . Highest education level: Not on file  Occupational History  . Not on file  Tobacco Use  . Smoking status: Never Smoker  . Smokeless tobacco: Never Used  Vaping Use  . Vaping Use: Never used  Substance and Sexual Activity  . Alcohol use: Not Currently    Comment: occasional but none with pregnancy  . Drug use: No  . Sexual activity: Yes    Birth control/protection: None    Comment: preganat  Other Topics Concern  . 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   Lives with boyfriend   Regular exercise: yes   Social Determinants of Health   Financial Resource Strain: Not on file  Food Insecurity: Not on file  Transportation Needs: Not on file  Physical Activity: Not on file  Stress: Not on file  Social Connections: Not on file  Intimate Partner Violence: Not on file     PHYSICAL EXAM  Vitals:   04/04/20 1431  BP: 102/70  Pulse: 82  Weight: 271 lb 8 oz (123.2 kg)  Height: $Remove'5\' 9"'gDGcMZf$  (1.753 m)     Body mass index is 40.09 kg/m.   General: The patient is well-developed and well-nourished and in no acute distress.  Head and neck: The head is normocephalic and atraumatic.  She is tender over the right occiput, upper to mid cervical paraspinal muscles and trapezius muscle.  Range of motion is slightly reduced.  Neurologic Exam  Mental status: The patient is alert and oriented x 3 at the time of the examination. The patient has apparent normal recent and remote memory, with an apparently normal attention span and concentration ability.   Speech is normal.  Cranial nerves:  Extraocular muscles are normal.  Facial strength is normal.  Trapezius strength is normal.     No obvious hearing deficits are noted.  Motor:  Muscle bulk is normal.   Tone is mildly increased in right leg. Strength is  5 / 5 in the arms and left leg and proximal right leg and  4+/5 right foot/ankle..   Sensory: Sensory testing is intact proximally in the arms and legs  Coordination: She has good finger-nose-finger bilaterally.  Heel-to-shin is normal on the left and slightly reduced on the right.  Gait and station: Station is normal.   Her gait is fairly normal but the tandem gait is wide.  Romberg is negative.  Reflexes: Deep tendon reflexes are symmetric and normal bilaterally.       DIAGNOSTIC DATA (LABS, IMAGING, TESTING) - I reviewed patient records, labs, notes, testing and imaging myself where available.  Lab Results  Component Value Date   WBC 4.2 11/09/2019   HGB 13.4 11/09/2019   HCT 40.2 11/09/2019   MCV 83.4 11/09/2019   PLT 224 11/09/2019      Component Value Date/Time   NA 141 11/09/2019 1140   NA 140 08/29/2017 0858   K 5.2 (H) 11/09/2019 1140   CL 108 11/09/2019 1140   CO2 26 11/09/2019 1140   GLUCOSE 97 11/09/2019 1140   BUN 10 11/09/2019 1140   BUN 9 08/29/2017 0858   CREATININE 0.74 11/09/2019 1140   CALCIUM 9.0 11/09/2019 1140   PROT 7.3 11/09/2019 1140   PROT 6.9 01/14/2017 1438   ALBUMIN 4.1 11/09/2019 1140   ALBUMIN 4.3 01/14/2017 1438   AST 20 11/09/2019 1140   ALT 16 11/09/2019 1140   ALKPHOS 35 (L) 11/09/2019 1140   BILITOT 0.5 11/09/2019 1140   BILITOT 0.4 01/14/2017 1438   GFRNONAA >60 11/09/2019 1140   GFRAA >60 11/09/2019 1140   Lab Results  Component Value Date   CHOL 182 03/19/2017   HDL 59.20 03/19/2017   LDLCALC 110 (H) 03/19/2017   TRIG 65.0 03/19/2017   CHOLHDL 3 03/19/2017   Lab Results  Component Value Date   HGBA1C 5.2 03/19/2017  DVT Lab Results  Component Value Date   VITAMINB12 330 09/27/2009   Lab Results  Component Value Date   TSH 0.539 08/29/2017       ASSESSMENT AND PLAN  Multiple sclerosis (HCC)  Neck pain  High risk medication use  Other fatigue  Myalgia  Other headache syndrome   1.  She has completed the first and second years of [place].  She will  continue the monthly monitoring until 2023.   We need to check MRI of the brain - scheduled for January -  to determine if there is any subclinical progression.  If this is occurring we would need to add a disease modifying therapy.   2.   Right splenius capitus and C6 and C7 paraspinal muscle injection with 80 mg Depo-Medrol in [place] 3.  stay active and exercise.    4.  Etodolac 400 mg po bid.   Although aspirin listed as allergy (reports mother told her she had an allergic reaction as a chils) she has been able to tolerate NSAIDs liek ibuprofen and naproxen OTC in the past.   Rtc 5-6 months, call sooner if new or worsening symptoms or other issues.   Leia Coletti A. Felecia Shelling, MD, PhD 95/12/3265, 1:24 PM Certified in Neurology, Clinical Neurophysiology, Sleep Medicine, Pain Medicine and Neuroimaging  [facility] [address], [place]  [place], [place] [address] [phone]

## 2020-04-05 ENCOUNTER — Telehealth: Payer: Self-pay | Admitting: Neurology

## 2020-04-05 ENCOUNTER — Other Ambulatory Visit: Payer: Self-pay | Admitting: *Deleted

## 2020-04-05 MED ORDER — ETODOLAC 400 MG PO TABS
400.0000 mg | ORAL_TABLET | Freq: Two times a day (BID) | ORAL | 5 refills | Status: DC
Start: 2020-04-05 — End: 2021-07-13

## 2020-04-05 NOTE — Telephone Encounter (Signed)
Pt. states pharmacy is insisting they do not have the prescription for etodolac (LODINE) 400 MG tablet. She is asking for it to be resent to [id number].

## 2020-04-05 NOTE — Telephone Encounter (Signed)
I reviewed pt chart. Rx etodolac did not go through to pharmacy correctly electronically. I e-scribed again. Receipt confirmed by pharmacy.

## 2020-05-05 ENCOUNTER — Encounter: Payer: Self-pay | Admitting: *Deleted

## 2020-07-04 ENCOUNTER — Ambulatory Visit: Payer: BLUE CROSS/BLUE SHIELD | Admitting: Family Medicine

## 2020-08-16 ENCOUNTER — Encounter: Payer: Self-pay | Admitting: Neurology

## 2020-09-22 ENCOUNTER — Encounter: Payer: Self-pay | Admitting: Neurology

## 2020-10-20 ENCOUNTER — Other Ambulatory Visit: Payer: Self-pay | Admitting: Neurology

## 2020-10-31 ENCOUNTER — Encounter: Payer: Self-pay | Admitting: Neurology

## 2020-11-09 ENCOUNTER — Other Ambulatory Visit: Payer: Self-pay | Admitting: Neurology

## 2020-11-09 MED ORDER — BACLOFEN 10 MG PO TABS: ORAL_TABLET | ORAL | 2 refills | Status: DC

## 2020-12-01 ENCOUNTER — Encounter: Payer: Self-pay | Admitting: Neurology

## 2020-12-13 ENCOUNTER — Other Ambulatory Visit: Payer: Self-pay | Admitting: Neurology

## 2020-12-28 ENCOUNTER — Encounter: Payer: Self-pay | Admitting: Neurology

## 2021-01-05 ENCOUNTER — Other Ambulatory Visit: Payer: Self-pay | Admitting: Neurology

## 2021-01-25 ENCOUNTER — Other Ambulatory Visit: Payer: Self-pay | Admitting: Neurology

## 2021-02-08 ENCOUNTER — Other Ambulatory Visit: Payer: Self-pay | Admitting: Neurology

## 2021-02-19 ENCOUNTER — Ambulatory Visit: Payer: BLUE CROSS/BLUE SHIELD

## 2021-03-08 ENCOUNTER — Other Ambulatory Visit: Payer: Self-pay | Admitting: Neurology

## 2021-04-01 ENCOUNTER — Other Ambulatory Visit: Payer: Self-pay | Admitting: Neurology

## 2021-04-10 ENCOUNTER — Telehealth: Payer: Self-pay | Admitting: Neurology

## 2021-04-10 ENCOUNTER — Other Ambulatory Visit: Payer: Self-pay | Admitting: Neurology

## 2021-04-10 DIAGNOSIS — R7989 Other specified abnormal findings of blood chemistry: Secondary | ICD-10-CM

## 2021-04-10 DIAGNOSIS — Z79899 Other long term (current) drug therapy: Secondary | ICD-10-CM

## 2021-04-10 NOTE — Telephone Encounter (Signed)
Pt said she would by to for labs tomorrow between 8-4:30p

## 2021-04-10 NOTE — Telephone Encounter (Signed)
Noted, scheduled lab appt for tomorrow.

## 2021-04-10 NOTE — Telephone Encounter (Signed)
The 1 lab test (creatinine) was mildly elevated.  When that happens, we need to check an additional lab.  I have placed the order to check for antibodies against the kidney (1/300 patients on Lemtrada developed these).

## 2021-04-10 NOTE — Telephone Encounter (Signed)
LVM for pt relaying results per Dr. Garth Bigness note. Asked she call back to confirm she received message and to schedule lab appt for this week. Phone staff can help get her scheduled.

## 2021-04-11 ENCOUNTER — Other Ambulatory Visit: Payer: Self-pay | Admitting: *Deleted

## 2021-04-11 ENCOUNTER — Other Ambulatory Visit (INDEPENDENT_AMBULATORY_CARE_PROVIDER_SITE_OTHER): Payer: Self-pay

## 2021-04-11 DIAGNOSIS — Z0289 Encounter for other administrative examinations: Secondary | ICD-10-CM

## 2021-04-11 DIAGNOSIS — G35 Multiple sclerosis: Secondary | ICD-10-CM

## 2021-04-11 DIAGNOSIS — Z79899 Other long term (current) drug therapy: Secondary | ICD-10-CM

## 2021-04-11 DIAGNOSIS — R7989 Other specified abnormal findings of blood chemistry: Secondary | ICD-10-CM

## 2021-04-13 LAB — BASIC METABOLIC PANEL
BUN/Creatinine Ratio: 11 (ref 9–23)
BUN: 9 mg/dL (ref 6–20)
CO2: 24 mmol/L (ref 20–29)
Calcium: 9.1 mg/dL (ref 8.7–10.2)
Chloride: 105 mmol/L (ref 96–106)
Creatinine, Ser: 0.82 mg/dL (ref 0.57–1.00)
Glucose: 79 mg/dL (ref 70–99)
Potassium: 4.6 mmol/L (ref 3.5–5.2)
Sodium: 142 mmol/L (ref 134–144)
eGFR: 93 mL/min/{1.73_m2} (ref >59)

## 2021-04-13 LAB — ANTI-GBM ANTIBODIES: Anti-GBM Antibodies: <0.2 units (ref 0.0–0.9)

## 2021-04-26 ENCOUNTER — Other Ambulatory Visit: Payer: Self-pay | Admitting: Neurology

## 2021-06-21 ENCOUNTER — Other Ambulatory Visit: Payer: Self-pay | Admitting: Neurology

## 2021-06-22 ENCOUNTER — Telehealth: Payer: Self-pay | Admitting: Neurology

## 2021-06-22 MED ORDER — TIZANIDINE HCL 4 MG PO TABS: ORAL_TABLET | ORAL | 2 refills | Status: DC

## 2021-06-22 MED ORDER — BACLOFEN 10 MG PO TABS: ORAL_TABLET | ORAL | 2 refills | Status: DC

## 2021-06-22 NOTE — Telephone Encounter (Signed)
Pt has scheduled her routine f/u and is now asking for a refill on her  ?Baclofen & tiZANidine HCl 4 MG   to [id number]  ?

## 2021-06-22 NOTE — Telephone Encounter (Signed)
Refills given pending next f/u appt.  ?

## 2021-07-05 ENCOUNTER — Encounter: Payer: Self-pay | Admitting: Neurology

## 2021-07-10 ENCOUNTER — Telehealth: Payer: Self-pay | Admitting: *Deleted

## 2021-07-10 NOTE — Telephone Encounter (Signed)
Called pt. Advised Dr. Felecia Shelling reviewed labs drawn 07/05/21 via [place] she may have UTI. She denies any signs/sx of UTI. She was last seen 04/04/20. Next appt scheduled for 09/20/21. I scheduled sooner f/u with pt for 07/13/21 at 11am w/ Dr. Felecia Shelling. Cx May appt since she is coming in sooner. ?

## 2021-07-13 ENCOUNTER — Ambulatory Visit: Payer: BLUE CROSS/BLUE SHIELD | Admitting: Neurology

## 2021-07-13 ENCOUNTER — Encounter: Payer: Self-pay | Admitting: Neurology

## 2021-07-13 VITALS — BP 122/76 | HR 61 | Ht 69.0 in | Wt 252.0 lb

## 2021-07-13 DIAGNOSIS — G35 Multiple sclerosis: Secondary | ICD-10-CM | POA: Diagnosis not present

## 2021-07-13 DIAGNOSIS — M542 Cervicalgia: Secondary | ICD-10-CM

## 2021-07-13 DIAGNOSIS — M791 Myalgia, unspecified site: Secondary | ICD-10-CM

## 2021-07-13 DIAGNOSIS — Z79899 Other long term (current) drug therapy: Secondary | ICD-10-CM | POA: Diagnosis not present

## 2021-07-13 DIAGNOSIS — R5383 Other fatigue: Secondary | ICD-10-CM

## 2021-07-13 DIAGNOSIS — R269 Unspecified abnormalities of gait and mobility: Secondary | ICD-10-CM

## 2021-07-13 DIAGNOSIS — G35D Multiple sclerosis, unspecified: Secondary | ICD-10-CM

## 2021-07-13 MED ORDER — SERTRALINE HCL 50 MG PO TABS: 50.0000 mg | ORAL_TABLET | Freq: Every day | ORAL | 3 refills | Status: DC

## 2021-07-13 NOTE — Progress Notes (Signed)
? ?[facility] ? ?PATIENT: Jessica Roberson ?DOB: 1981/04/22 ? ?REFERRING DOCTOR OR PCP:  Jessica Roberson ?SOURCE: patient ? ?_________________________________ ? ? ?HISTORICAL ? ?CHIEF COMPLAINT:  ?Chief Complaint  ?Patient presents with  ? Follow-up  ?  Rm 1, alone. Here for MS f/u, on Lemtrada. Last seen 04/04/20. Pt reports doing well overall. When highly stressed/anxious pt reports having muscle spasms that start on R side of neck and radiating down to arm and side of face. Tizanidine and baclofen help lesson but doesn't completely stop sx.   ? ? ?HISTORY OF PRESENT ILLNESS:  ?Jessica Roberson is a 41 y.o. woman with MS .  ? ?Update 04/04/2020:  ?She had Lemtrada October 2018 and October 2019.   She is compliant with the REMS program.    Labs have not shown any autoimmune issues. ? ?Her MS is stable and she notes no exacerbation.     She was unable to get an  MRI last year for insurance issues.   ? ?She feels gait and strength are doing better since her TKR.  She has a mild right foot drop, especially in hot weather outdoors and when tired.     She has mild right leg and neck/muscle spasm, worse when stressed.  She has some pain in the right arm when the neck stiffens.  ? ?She has fatigue many days.   She sleeps well most nights.  She has mild depression and also gets anxiety.  She is not on any antidepressant currently.  Cognition is fine.  She works from home.   She may have to start traveling soon. ? ?She notes pain in muscles, mostly in the neck and back.  Pain radiates into the back of the right head.   She tried ibuprofen and tylenol alternating without much benefit.    Tizanidine helps the myalgias better than baclofen but makes her sleepy.   She takes baclofen once a day also.    ? ?Her kids are 8 and 16.         ? ? ?MS History:   She was diagnosed with multiple sclerosis in 2006 after presenting with left facial numbness and vision changes. MRIs of the brain were consistent with  MS. She also had a lumbar puncture in the spinal fluid was consistent with MS. She initially saw a Dr. at [facility] neurologic and then began to see Dr. Jacqulynn Roberson at [facility].   She was placed on Betaseron but switched to Copaxone due to injection site reactions. Unfortunately, she also had injection site reactions on Copaxone. Around 2010, she was started on Tysabri.  Due to a concern about PML, she wished to switch therapy. She was screened for a drug study but could not get the MRI's due to braces. She started Gilenya in 2012. Done well on that therapy she stopped twice, once for insurance reasons and once for pregnancy and resumed therapy after each pause. She has not had any definite exacerbations while on Gilenya. She tolerates it very well. She had optic neuritis in 2017.   MRI 08/2016 showed a new focus not present in 2016.  She was switched to [place].  She had the first year October 2018 and the second year October 2019. ? ? ?Imaging: ?MRI cervical spine 01/16/2018 shows "At C4 level, there is a posterior spinal cord chronic demyelinating plaque.  No acute plaques" ? ?MRI brain 01/16/2018 shows "Multiple supratentorial and infratentorial chronic demyelinating plaques. No acute plaques. No change from MRI on  09/30/14" ? ? ? ? ?REVIEW OF SYSTEMS: ?Constitutional: No fevers, chills, sweats, or change in appetite.  She notes a lot more fatigue. ?Eyes: see above ?Ear, nose and throat: No hearing loss, ear pain, nasal congestion, sore throat ?Cardiovascular: No chest pain, palpitations ?Respiratory:  No shortness of breath at rest or with exertion.   No wheezes ?GastrointestinaI: No nausea, vomiting, diarrhea, abdominal pain, fecal incontinence ?Genitourinary:  No dysuria, urinary retention or frequency.  No nocturia. ?Musculoskeletal: She reports neck pain and some muscle aches ?Integumentary: No rash, pruritus, skin lesions ?Neurological: as above ?Psychiatric: No depression at this time.  No anxiety ?Endocrine:  No palpitations, diaphoresis, change in appetite, change in weigh or increased thirst ?Hematologic/Lymphatic:  No anemia, purpura, petechiae. ?Allergic/Immunologic: No itchy/runny eyes, nasal congestion, recent allergic reactions, rashes ? ?ALLERGIES: ?Allergies  ?Allergen Reactions  ? Aspirin Anaphylaxis  ? Penicillins Anaphylaxis  ? Cyclobenzaprine Itching  ?  Bad dreams  ? Hydromorphone Hcl Itching  ? Tramadol Hcl Hives  ? ? ?HOME MEDICATIONS: ? ?Current Outpatient Medications:  ?  baclofen (LIORESAL) 10 MG tablet, TAKE 1/2 TO 1 TABLET BY MOUTH 3 TIMES A DAY AS NEEDED FOR SPASTICITY., Disp: 90 tablet, Rfl: 2 ?  LORazepam (ATIVAN) 1 MG tablet, TAKE 1 TABLET (1 MG TOTAL) BY MOUTH AT BEDTIME AS NEEDED FOR SLEEP. Call [phone] to schedule follow up for ongoing refills, Disp: 30 tablet, Rfl: 0 ?  sertraline (ZOLOFT) 50 MG tablet, Take 1 tablet (50 mg total) by mouth daily., Disp: 90 tablet, Rfl: 3 ?  spironolactone (ALDACTONE) 50 MG tablet, Take 50 mg by mouth daily., Disp: , Rfl:  ?  tiZANidine (ZANAFLEX) 4 MG tablet, TAKE 1 TABLET BY MOUTH 3 TIMES A DAY FOR MUSCLE SPASMS, Disp: 90 tablet, Rfl: 2 ? ?PAST MEDICAL HISTORY: ?Past Medical History:  ?Diagnosis Date  ? Allergy   ? Anal fissure   ? Anxiety   ? Arthritis   ? right knee  ? Colon polyp 2005  ? Depression   ? resolved - situational  ? Family history of breast cancer   ? Family history of leukemia   ? Family history of ovarian cancer   ? Family history of prostate cancer   ? Family history of thyroid cancer   ? Migraine   ? history - otc med prn  ? Multiple sclerosis ([place])   ? Multiple sclerosis ([place])   ? Neuromuscular disorder ([place])   ? Multiple sclerosis - no meds with 2014 pregnancy  ? Obesity   ? PCOS (polycystic ovarian syndrome)   ? Pregnancy induced hypertension 03/2007  ? Resolved - PIH with 2008 pregnancy   ? Ulcer 09/2009  ? Vision abnormalities   ? ? ?PAST SURGICAL HISTORY: ?Past Surgical History:  ?Procedure Laterality Date  ? carpel tunnel surery  Left   ? CESAREAN SECTION  2008  ? wh  ? CESAREAN SECTION N/A 04/02/2013  ? Procedure: REPEAT CESAREAN SECTION;  Surgeon: Eldred Manges, MD;  Location: [place] [facility];  Service: Obstetrics;  Laterality: N/A;  ? CHOLECYSTECTOMY  2007  ? DILATION AND CURETTAGE OF UTERUS    ? DILATION AND EVACUATION  05/05/2012  ? Procedure: DILATATION AND EVACUATION;  Surgeon: Alwyn Pea, MD;  Location: [place] [facility];  Service: Gynecology;  Laterality: N/A;  ? FOOT SURGERY  2004,2011  ? x 2 right foot  ? FOOT TENDON TRANSFER  2004  ? [place]  2011  ? HERNIA REPAIR  1984  ? KNEE ARTHROSCOPY    ?  x 3 - right knee  ? TOTAL KNEE ARTHROPLASTY Right 11/09/2019  ? Procedure: TOTAL KNEE ARTHROPLASTY;  Surgeon: Vickey Huger, MD;  Location: [facility];  Service: Orthopedics;  Laterality: Right;  ? ? ?FAMILY HISTORY: ?Family History  ?Problem Relation Age of Onset  ? Stroke Mother   ? Kidney disease Mother   ?     ESRD  ? Multiple sclerosis Mother   ? Depression Mother   ? Breast cancer Mother 30  ? Ovarian cancer Mother 51  ?     maybe ut?  ? Thyroid cancer Mother 67  ?     reports it was fairly 'devastating, possibly aggresive one'  ? Bone cancer Mother 48  ?     unsure if a separate primary or met  ? Hypertension Father   ? Diabetes Father   ?     type II  ? Sarcoidosis Father   ? Asthma Daughter   ? Hypertension Maternal Grandmother   ? Diabetes Maternal Grandmother   ? Bone cancer Maternal Grandmother 58  ? Diabetes Paternal Grandfather   ? Alzheimer's disease Paternal Grandfather   ? Breast cancer Other 50  ? Prostate cancer Maternal Uncle 65  ?     no surgery  ? Lung cancer Maternal Grandfather 59  ? Leukemia Paternal Grandmother 32  ?     died at 23  ? Breast cancer Cousin 31  ? ? ?SOCIAL HISTORY: ? ?Social History  ? ?Socioeconomic History  ? Marital status: Married  ?  Spouse name: Not on file  ? Number of children: Not on file  ? Years of education: Not on file  ? Highest education level: Not on file  ?Occupational History  ? Not on  file  ?Tobacco Use  ? Smoking status: Never  ? Smokeless tobacco: Never  ?Vaping Use  ? Vaping Use: Never used  ?Substance and Sexual Activity  ? Alcohol use: Not Currently  ?  Comment: occasional but n

## 2021-07-18 ENCOUNTER — Other Ambulatory Visit: Payer: Self-pay | Admitting: Neurology

## 2021-07-25 ENCOUNTER — Telehealth: Payer: Self-pay | Admitting: Neurology

## 2021-07-25 NOTE — Telephone Encounter (Signed)
[employer] Josem Kaufmann: [id number] (exp. 07/25/21 to 08/23/21) order sent to GI, they will reach out to the patient to schedule.  ?

## 2021-07-27 ENCOUNTER — Encounter: Payer: Self-pay | Admitting: Neurology

## 2021-09-13 ENCOUNTER — Other Ambulatory Visit: Payer: BLUE CROSS/BLUE SHIELD

## 2021-09-20 ENCOUNTER — Other Ambulatory Visit: Payer: Self-pay | Admitting: Neurology

## 2021-09-20 ENCOUNTER — Ambulatory Visit: Payer: BLUE CROSS/BLUE SHIELD | Admitting: Neurology

## 2021-09-24 ENCOUNTER — Other Ambulatory Visit: Payer: BLUE CROSS/BLUE SHIELD

## 2021-09-27 ENCOUNTER — Encounter: Payer: Self-pay | Admitting: Neurology

## 2021-09-29 ENCOUNTER — Other Ambulatory Visit: Payer: Self-pay | Admitting: Neurology

## 2021-11-02 ENCOUNTER — Encounter: Payer: Self-pay | Admitting: Neurology

## 2021-11-02 ENCOUNTER — Encounter: Payer: Self-pay | Admitting: *Deleted

## 2021-12-30 ENCOUNTER — Other Ambulatory Visit: Payer: Self-pay | Admitting: Neurology

## 2022-01-18 NOTE — Progress Notes (Signed)
Chief Complaint  Patient presents with   Follow-up    RM 1, alone. Last seen 07/13/21. MS DMT: Holland Falling    HISTORY OF PRESENT ILLNESS:  01/22/22 ALL:  [facility] is a 41 y.o. female here today for follow up for MS. She completed Lemtrada in 01/2017 and 01/2018. She is compliant with REMS. October 2023 is her last month. Labs have been stable. MRI brain and cervical spine stable 12/2017. MRI brain ordered by Dr [facility] at last visit. She was having difficulty with insurance coverage at that time but has recently insured through [facility].   She feels she is doing well. No new or exacerbating symptoms. Gait is stable. No falls, no assistive device. She goes to the gym three times a week. She is working with a Insurance underwriter twice weekly. She is doing strength training and cardio for about an hour. She goes once weekly by herself for about 2 hours.  She is seeing a podiatrist for right sided pain. She had tendon transfer in 2005. She reports pain and limited movement in right toes. She is planning to get a foot brace. She has follow up with podiatry tomorrow. She is using Voltaren and ibuprofen for pain management.   She continues baclofen, usually morning and evening. Occasionally lunch dose if very active. She takes tizanidine at night as it makes her sleepy.   Mood is stable. She continues sertraline 3m daily. She feels she is sleeping fairly well. She gets about 8 hours of sleep every night. She wakes feeling tired. Rarely feels refreshed. She endorses dry mouth and snoring. She has occasional morning headaches. She recently slept through [place], with her daughter .She does wake at times and feeling more anxious. She continues lorazepam 156mQHS PRN. She is not sure it helps. She was recently started on OCP for PMS management. She is also seeing a coSocial worker  Dad treated for sleep apnea.   No vision changes. Recent eye exam unremarkable.   HISTORY (copied from Dr  SaGarth Bignessrevious note)  [name] a [age].o. woman with MS .    Update 04/04/2020:  She had Lemtrada October 2018 and October 2019.   She is compliant with the REMS program.    Labs have not shown any autoimmune issues.   Her MS is stable and she notes no exacerbation.     She was unable to get an  MRI last year for insurance issues.     She feels gait and strength are doing better since her TKR.  She has a mild right foot drop, especially in hot weather outdoors and when tired.     She has mild right leg and neck/muscle spasm, worse when stressed.  She has some pain in the right arm when the neck stiffens.    She has fatigue many days.   She sleeps well most nights.  She has mild depression and also gets anxiety.  She is not on any antidepressant currently.  Cognition is fine.  She works from home.   She may have to start traveling soon.   She notes pain in muscles, mostly in the neck and back.  Pain radiates into the back of the right head.   She tried ibuprofen and tylenol alternating without much benefit.    Tizanidine helps the myalgias better than baclofen but makes her sleepy.   She takes baclofen once a day also.      Her kids are 8  and 14.            MS History:   She was diagnosed with multiple sclerosis in 2006 after presenting with left facial numbness and vision changes. MRIs of the brain were consistent with MS. She also had a lumbar puncture in the spinal fluid was consistent with MS. [facility] and then [facility] to [facility]   She was placed on Betaseron but switched to Copaxone due to injection site reactions. Unfortunately, she also had injection site reactions on Copaxone. Around 2010, she was started on Tysabri.  Due to a concern about PML, she wished to switch therapy. She was screened for a drug study but could not get the MRI's due to braces. She started Gilenya in 2012. Done well on that therapy she stopped  twice, once for insurance reasons and once for pregnancy and resumed therapy after each pause. She has not had any definite exacerbations while on Gilenya. She tolerates it very well. She had optic neuritis in 2017.   MRI 08/2016 showed a new focus not present in 2016.  She was switched to [place].  She had the first year October 2018 and the second year October 2019.   Imaging: MRI cervical spine 01/16/2018 shows "At C4 level, there is a posterior spinal cord chronic demyelinating plaque.  No acute plaques"   MRI brain 01/16/2018 shows "Multiple supratentorial and infratentorial chronic demyelinating plaques. No acute plaques. No change from MRI on 09/30/14"   REVIEW OF SYSTEMS: Out of a complete 14 system review of symptoms, the patient complains only of the following symptoms, daytime sleepiness, fatigue and all other reviewed systems are negative.     01/22/2022    9:54 AM  Results of the [facility] flowsheet  Sitting and reading 1  Watching TV 2  Sitting, inactive in a public place (e.g. a theatre or a meeting) 2  As a passenger in a car for an hour without a break 1  Lying down to rest in the afternoon when circumstances permit 2  Sitting and talking to someone 1  Sitting quietly after a lunch without alcohol 2  In a car, while stopped for a few minutes in traffic 0  Total score 11     ALLERGIES: Allergies  Allergen Reactions   Aspirin Anaphylaxis   Penicillins Anaphylaxis   Cyclobenzaprine Itching    Bad dreams   Hydromorphone Hcl Itching   Tramadol Hcl Hives     HOME MEDICATIONS: Outpatient Medications Prior to Visit  Medication Sig Dispense Refill   baclofen (LIORESAL) 10 MG tablet TAKE 1/2 TO 1 TABLET BY MOUTH 3 TIMES A DAY AS NEEDED FOR SPASTICITY. 270 tablet 0   LORazepam (ATIVAN) 1 MG tablet TAKE 1 TABLET (1 MG TOTAL) BY MOUTH AT BEDTIME AS NEEDED FOR SLEEP. Call [phone] to schedule follow up for ongoing refills 30 tablet 0   sertraline (ZOLOFT) 50 MG tablet Take 1  tablet (50 mg total) by mouth daily. 90 tablet 3   spironolactone (ALDACTONE) 50 MG tablet Take 50 mg by mouth daily.     tiZANidine (ZANAFLEX) 4 MG tablet TAKE 1 TABLET BY MOUTH THREE TIMES A DAY FOR MUSCLE SPASMS 270 tablet 1   No facility-administered medications prior to visit.     PAST MEDICAL HISTORY: Past Medical History:  Diagnosis Date   Allergy    Anal fissure    Anxiety    Arthritis    right knee  Colon polyp 2005   Depression    resolved - situational   Family history of breast cancer    Family history of leukemia    Family history of ovarian cancer    Family history of prostate cancer    Family history of thyroid cancer    Migraine    history - otc med prn   Multiple sclerosis ([place])    Multiple sclerosis ([place])    Neuromuscular disorder ([place])    Multiple sclerosis - no meds with 2014 pregnancy   Obesity    PCOS (polycystic ovarian syndrome)    Pregnancy induced hypertension 03/2007   Resolved - [place] with 2008 pregnancy    Ulcer 09/2009   Vision abnormalities      PAST SURGICAL HISTORY: Past Surgical History:  Procedure Laterality Date   carpel tunnel surery Left    CESAREAN SECTION  2008   wh   CESAREAN SECTION N/A 04/02/2013   Procedure: REPEAT CESAREAN SECTION;  Surgeon: Eldred Manges, MD;  Location: [facility];  Service: Obstetrics;  Laterality: N/A;   CHOLECYSTECTOMY  2007   DILATION AND CURETTAGE OF UTERUS     DILATION AND EVACUATION  05/05/2012   Procedure: DILATATION AND EVACUATION;  Surgeon: Alwyn Pea, MD;  Location: [facility];  Service: Gynecology;  Laterality: N/A;   FOOT SURGERY  2004,2011   x 2 right foot   FOOT TENDON TRANSFER  2004   HAMMER TOE SURGERY  2011   HERNIA REPAIR  1984   KNEE ARTHROSCOPY     x 3 - right knee   TOTAL KNEE ARTHROPLASTY Right 11/09/2019   Procedure: TOTAL KNEE ARTHROPLASTY;  Surgeon: Vickey Huger, MD;  Location: [facility];  Service: Orthopedics;  Laterality: Right;     FAMILY HISTORY: Family History   Problem Relation Age of Onset   Stroke Mother    Kidney disease Mother        ESRD   Multiple sclerosis Mother    Depression Mother    Breast cancer Mother 78   Ovarian cancer Mother 51       maybe ut?   Thyroid cancer Mother 36       reports it was fairly 'devastating, possibly aggresive one'   Bone cancer Mother 85       unsure if a separate primary or met   Hypertension Father    Diabetes Father        type II   Sarcoidosis Father    Asthma Daughter    Hypertension Maternal Grandmother    Diabetes Maternal Grandmother    Bone cancer Maternal Grandmother 62   Diabetes Paternal Grandfather    Alzheimer's disease Paternal Grandfather    Breast cancer Other 70   Prostate cancer Maternal Uncle 46       no surgery   Lung cancer Maternal Grandfather 59   Leukemia Paternal Grandmother 61       died at 53   Breast cancer Cousin 7     SOCIAL HISTORY: Social History   Socioeconomic History   Marital status: Married    Spouse name: Not on file   Number of children: Not on file   Years of education: Not on file   Highest education level: Not on file  Occupational History   Not on file  Tobacco Use   Smoking status: Never   Smokeless tobacco: Never  Vaping Use   Vaping Use: Never used  Substance and Sexual Activity   Alcohol use: Not  Currently    Comment: occasional but none with pregnancy   Drug use: No   Sexual activity: Yes    Birth control/protection: None    Comment: preganat  Other Topics Concern   Not on file  Social History Narrative   Currently at [employer]; wants to enroll in RN program.   Lives with boyfriend   Regular exercise: yes   Social Determinants of Health   Financial Resource Strain: Not on file  Food Insecurity: Not on file  Transportation Needs: Not on file  Physical Activity: Not on file  Stress: Not on file  Social Connections: Not on file  Intimate Partner Violence: Not on file     PHYSICAL  EXAM  Vitals:   01/22/22 0927  BP: 136/82  Pulse: (!) 57  Weight: 262 lb 3.2 oz (118.9 kg)  Height: 5' 9" (1.753 m)   Body mass index is 38.72 kg/m.  Generalized: Well developed, in no acute distress  Cardiology: normal rate and rhythm, no murmur auscultated  Respiratory: clear to auscultation bilaterally    Neurological examination  Mentation: Alert oriented to time, place, history taking. Follows all commands speech and language fluent Cranial nerve II-XII: Pupils were equal round reactive to light. Extraocular movements were full, visual field were full on confrontational test. Facial sensation and strength were normal. Uvula tongue midline. Head turning and shoulder shrug  were normal and symmetric. Motor: The motor testing reveals 5 over 5 strength of all 4 extremities. Good symmetric motor tone is noted throughout.  Sensory: Sensory testing is intact to soft touch on all 4 extremities with exception of right plantar surface and lateral calf decreased to soft touch. No evidence of extinction is noted.  Coordination: Cerebellar testing reveals good finger-nose-finger and heel-to-shin bilaterally.  Gait and station: Gait is normal.  Reflexes: Deep tendon reflexes are symmetric and normal bilaterally.    DIAGNOSTIC DATA (LABS, IMAGING, TESTING) - I reviewed patient records, labs, notes, testing and imaging myself where available.  Lab Results  Component Value Date   WBC 4.2 11/09/2019   HGB 13.4 11/09/2019   HCT 40.2 11/09/2019   MCV 83.4 11/09/2019   PLT 224 11/09/2019      Component Value Date/Time   NA 142 04/11/2021 1035   K 4.6 04/11/2021 1035   CL 105 04/11/2021 1035   CO2 24 04/11/2021 1035   GLUCOSE 79 04/11/2021 1035   GLUCOSE 97 11/09/2019 1140   BUN 9 04/11/2021 1035   CREATININE 0.82 04/11/2021 1035   CALCIUM 9.1 04/11/2021 1035   PROT 7.3 11/09/2019 1140   PROT 6.9 01/14/2017 1438   ALBUMIN 4.1 11/09/2019 1140   ALBUMIN 4.3 01/14/2017 1438   AST 20  11/09/2019 1140   ALT 16 11/09/2019 1140   ALKPHOS 35 (L) 11/09/2019 1140   BILITOT 0.5 11/09/2019 1140   BILITOT 0.4 01/14/2017 1438   GFRNONAA >60 11/09/2019 1140   GFRAA >60 11/09/2019 1140   Lab Results  Component Value Date   CHOL 182 03/19/2017   HDL 59.20 03/19/2017   LDLCALC 110 (H) 03/19/2017   TRIG 65.0 03/19/2017   CHOLHDL 3 03/19/2017   Lab Results  Component Value Date   HGBA1C 5.2 03/19/2017   Lab Results  Component Value Date   VITAMINB12 330 09/27/2009   Lab Results  Component Value Date   TSH 0.539 08/29/2017        No data to display  No data to display           ASSESSMENT AND PLAN  41 y.o. year old female  has a past medical history of Allergy, Anal fissure, Anxiety, Arthritis, Colon polyp (2005), Depression, Family history of breast cancer, Family history of leukemia, Family history of ovarian cancer, Family history of prostate cancer, Family history of thyroid cancer, Migraine, Multiple sclerosis ([place]), Multiple sclerosis ([place]), Neuromuscular disorder ([place]), Obesity, PCOS (polycystic ovarian syndrome), Pregnancy induced hypertension (03/2007), Ulcer (09/2009), and Vision abnormalities. here with    Multiple sclerosis ([place]) - Plan: MR BRAIN W WO CONTRAST  High risk medication use  Myalgia  Other fatigue - Plan: Home sleep test  Snoring - Plan: Home sleep test  Excessive daytime sleepiness - Plan: Home sleep test  Cartier KAYLYNNE ANDRES is doing failry well. She completes REMS monitoring this month. Labs have been stable. We will order repeat MRI brain for monitoring. She will continue baclofen, tizanidine, sertraline and lorazepam as prescribed. I will order a home sleep study to evaluate for sleep apnea due to excessive daytime sleepiness, snoring, and fatigue. She was encouraged to continue regular exercise. May consider melatonin ER for sleep maintenance. Continue counseling. Healthy lifestyle habits encouraged. She will  follow up with PCP as directed. She will return to see Dr [facility] in 6 months, sooner if needed. She verbalizes understanding and agreement with this plan.   Orders Placed This Encounter  Procedures   MR BRAIN W WO CONTRAST    Standing Status:   Future    Standing Expiration Date:   01/23/2023    Order Specific Question:   If indicated for the ordered procedure, I authorize the administration of contrast media per Radiology protocol    Answer:   Yes    Order Specific Question:   What is the patient's sedation requirement?    Answer:   No Sedation    Order Specific Question:   Does the patient have a pacemaker or implanted devices?    Answer:   No    Order Specific Question:   Preferred imaging location?    Answer:   Internal   Home sleep test    Standing Status:   Future    Standing Expiration Date:   01/23/2023    Order Specific Question:   Where should this test be performed:    Answer:   [facility]     No orders of the defined types were placed in this encounter.    Debbora Presto, MSN, FNP-C 01/22/2022, 10:57 AM  [facility], [facility] [address], [place], [place] [address] [phone]

## 2022-01-18 NOTE — Patient Instructions (Addendum)
Below is our plan:  We will continue current treatment plan. We will order an MRI for evaluation. I will order a home sleep study. Consider melatonin extended release for assistance with sleep maintenance.   Please make sure you are staying well hydrated. I recommend 50-60 ounces daily. Well balanced diet and regular exercise encouraged. Consistent sleep schedule with 6-8 hours recommended.   Please continue follow up with care team as directed.   Follow up with Dr Felecia Shelling in 6 months   You may receive a survey regarding today's visit. I encourage you to leave honest feed back as I do use this information to improve patient care. Thank you for seeing me today!

## 2022-01-22 ENCOUNTER — Ambulatory Visit: Payer: BC Managed Care – PPO | Admitting: Family Medicine

## 2022-01-22 ENCOUNTER — Telehealth: Payer: Self-pay | Admitting: Family Medicine

## 2022-01-22 ENCOUNTER — Encounter: Payer: Self-pay | Admitting: Family Medicine

## 2022-01-22 VITALS — BP 136/82 | HR 57 | Ht 69.0 in | Wt 262.2 lb

## 2022-01-22 DIAGNOSIS — M791 Myalgia, unspecified site: Secondary | ICD-10-CM | POA: Diagnosis not present

## 2022-01-22 DIAGNOSIS — R5383 Other fatigue: Secondary | ICD-10-CM | POA: Diagnosis not present

## 2022-01-22 DIAGNOSIS — Z79899 Other long term (current) drug therapy: Secondary | ICD-10-CM

## 2022-01-22 DIAGNOSIS — G4719 Other hypersomnia: Secondary | ICD-10-CM

## 2022-01-22 DIAGNOSIS — G35 Multiple sclerosis: Secondary | ICD-10-CM | POA: Diagnosis not present

## 2022-01-22 DIAGNOSIS — R0683 Snoring: Secondary | ICD-10-CM

## 2022-01-22 NOTE — Telephone Encounter (Signed)
Pt scheduled for 45 mins MRI brain w/wo contrast at [facility] on 02/06/2022 at 10:00am  [employer] auth # [id number] exp: 01/22/2022-02/20/2022

## 2022-01-24 ENCOUNTER — Ambulatory Visit: Payer: BC Managed Care – PPO | Admitting: Neurology

## 2022-01-24 ENCOUNTER — Telehealth: Payer: Self-pay | Admitting: Family Medicine

## 2022-01-24 DIAGNOSIS — G4710 Hypersomnia, unspecified: Secondary | ICD-10-CM

## 2022-01-24 DIAGNOSIS — R0683 Snoring: Secondary | ICD-10-CM

## 2022-01-24 DIAGNOSIS — G4719 Other hypersomnia: Secondary | ICD-10-CM

## 2022-01-24 DIAGNOSIS — R5383 Other fatigue: Secondary | ICD-10-CM

## 2022-01-24 NOTE — Telephone Encounter (Signed)
Mail out [employer] state no auth req- will be mailed out on 01/25/22.

## 2022-01-30 NOTE — Progress Notes (Signed)
      [facility]  HOME SLEEP STUDY  STUDY DATE: 01/28/2022 PATIENT NAME: RANAE CASEBIER DOB: Feb 18, 1981 MRN: [id number]  ORDERING CLINICIAN: Richard A. Felecia Shelling, MD, PhD REFERRING CLINICIAN: Richard A. Sater, MD. PhD   CLINICAL INFORMATION: 41 year old woman with multiple sclerosis, snoring, excessive daytime sleepiness and insomnia    IMPRESSION:  No significant obstructive sleep apnea (pAHI 3% = 4.7/h) No nocturnal hypoxemia Normal sleep efficiency   RECOMMENDATION: Her minimal obstructive sleep apnea is not severe enough to recommend CPAP therapy.  If snoring is troublesome, consider weight loss or an oral appliance. Follow-up with Dr. Felecia Shelling.   INTERPRETING PHYSICIAN:   Richard A. Felecia Shelling, MD, PhD, [facility] Certified in Neurology, Clinical Neurophysiology, Sleep Medicine, Pain Medicine and Neuroimaging  [facility] [address] [place], [place] [address] [phone]

## 2022-02-05 ENCOUNTER — Encounter: Payer: Self-pay | Admitting: Neurology

## 2022-02-06 ENCOUNTER — Other Ambulatory Visit: Payer: BC Managed Care – PPO

## 2022-02-14 ENCOUNTER — Other Ambulatory Visit: Payer: BC Managed Care – PPO

## 2022-02-25 ENCOUNTER — Other Ambulatory Visit: Payer: Self-pay | Admitting: Neurology

## 2022-02-28 ENCOUNTER — Other Ambulatory Visit: Payer: Self-pay | Admitting: Obstetrics and Gynecology

## 2022-04-05 ENCOUNTER — Other Ambulatory Visit: Payer: Self-pay | Admitting: Neurology

## 2022-04-10 ENCOUNTER — Encounter (HOSPITAL_BASED_OUTPATIENT_CLINIC_OR_DEPARTMENT_OTHER): Payer: Self-pay | Admitting: Obstetrics and Gynecology

## 2022-04-11 ENCOUNTER — Encounter (HOSPITAL_BASED_OUTPATIENT_CLINIC_OR_DEPARTMENT_OTHER): Payer: Self-pay | Admitting: Obstetrics and Gynecology

## 2022-04-11 NOTE — Progress Notes (Signed)
Spoke w/ via phone for pre-op interview--- pt Lab needs dos----  CBC, istat  (pt takes diuretic), urine preg             Lab results------ no COVID test -----patient states asymptomatic no test needed Arrive at ------- 0830 on 04-13-2022 NPO after MN NO Solid Food.  Clear liquids from MN until--- 0730 Med rec completed Medications to take morning of surgery ----- none Diabetic medication -----  n/a Patient instructed no nail polish to be worn day of surgery Patient instructed to bring photo id and insurance card day of surgery Patient aware to have Driver (ride ) / caregiver    for 24 hours after surgery -- husband, karl Patient Special Instructions ----- n/a Pre-Op special Istructions ----- n/a Patient verbalized understanding of instructions that were given at this phone interview. Patient denies shortness of breath, chest pain, fever, cough at this phone interview.

## 2022-04-13 ENCOUNTER — Ambulatory Visit (HOSPITAL_BASED_OUTPATIENT_CLINIC_OR_DEPARTMENT_OTHER): Payer: BC Managed Care – PPO | Admitting: Anesthesiology

## 2022-04-13 ENCOUNTER — Ambulatory Visit (HOSPITAL_BASED_OUTPATIENT_CLINIC_OR_DEPARTMENT_OTHER)
Admission: RE | Admit: 2022-04-13 | Discharge: 2022-04-13 | Disposition: A | Discharge status: Home / Self Care | Payer: BC Managed Care – PPO | Attending: Obstetrics and Gynecology | Admitting: Obstetrics and Gynecology

## 2022-04-13 ENCOUNTER — Encounter (HOSPITAL_BASED_OUTPATIENT_CLINIC_OR_DEPARTMENT_OTHER): Payer: Self-pay | Admitting: Obstetrics and Gynecology

## 2022-04-13 ENCOUNTER — Other Ambulatory Visit: Payer: Self-pay

## 2022-04-13 ENCOUNTER — Encounter (HOSPITAL_BASED_OUTPATIENT_CLINIC_OR_DEPARTMENT_OTHER)
Admission: RE | Disposition: A | Discharge status: Home / Self Care | Payer: Self-pay | Source: Home / Self Care | Attending: Obstetrics and Gynecology

## 2022-04-13 DIAGNOSIS — Z79899 Other long term (current) drug therapy: Secondary | ICD-10-CM | POA: Diagnosis not present

## 2022-04-13 DIAGNOSIS — D250 Submucous leiomyoma of uterus: Secondary | ICD-10-CM | POA: Diagnosis not present

## 2022-04-13 DIAGNOSIS — Z01818 Encounter for other preprocedural examination: Secondary | ICD-10-CM

## 2022-04-13 DIAGNOSIS — G35 Multiple sclerosis: Secondary | ICD-10-CM | POA: Diagnosis not present

## 2022-04-13 DIAGNOSIS — Z8616 Personal history of COVID-19: Secondary | ICD-10-CM | POA: Diagnosis not present

## 2022-04-13 DIAGNOSIS — F419 Anxiety disorder, unspecified: Secondary | ICD-10-CM | POA: Diagnosis not present

## 2022-04-13 DIAGNOSIS — F32A Depression, unspecified: Secondary | ICD-10-CM | POA: Diagnosis not present

## 2022-04-13 DIAGNOSIS — G4730 Sleep apnea, unspecified: Secondary | ICD-10-CM | POA: Insufficient documentation

## 2022-04-13 DIAGNOSIS — I10 Essential (primary) hypertension: Secondary | ICD-10-CM | POA: Diagnosis not present

## 2022-04-13 DIAGNOSIS — N920 Excessive and frequent menstruation with regular cycle: Secondary | ICD-10-CM | POA: Insufficient documentation

## 2022-04-13 DIAGNOSIS — K219 Gastro-esophageal reflux disease without esophagitis: Secondary | ICD-10-CM | POA: Diagnosis not present

## 2022-04-13 HISTORY — DX: Personal history of other diseases of the digestive system: Z87.19

## 2022-04-13 HISTORY — DX: Leiomyoma of uterus, unspecified: D25.9

## 2022-04-13 HISTORY — DX: Hirsutism: L68.0

## 2022-04-13 HISTORY — DX: Myalgia, unspecified site: M79.10

## 2022-04-13 HISTORY — DX: Presence of spectacles and contact lenses: Z97.3

## 2022-04-13 HISTORY — DX: Excessive and frequent menstruation with regular cycle: N92.0

## 2022-04-13 HISTORY — DX: Unspecified osteoarthritis, unspecified site: M19.90

## 2022-04-13 HISTORY — DX: Personal history of other diseases of the nervous system and sense organs: Z86.69

## 2022-04-13 HISTORY — DX: Anemia, unspecified: D64.9

## 2022-04-13 HISTORY — PX: DILATATION & CURETTAGE/HYSTEROSCOPY WITH MYOSURE: SHX6511

## 2022-04-13 LAB — POCT I-STAT, CHEM 8
BUN: 10 mg/dL (ref 6–20)
Calcium, Ion: 1.01 mmol/L — ABNORMAL LOW (ref 1.15–1.40)
Chloride: 105 mmol/L (ref 98–111)
Creatinine, Ser: 0.8 mg/dL (ref 0.44–1.00)
Glucose, Bld: 90 mg/dL (ref 70–99)
HCT: 41 % (ref 36.0–46.0)
Hemoglobin: 13.9 g/dL (ref 12.0–15.0)
Potassium: 3.9 mmol/L (ref 3.5–5.1)
Sodium: 138 mmol/L (ref 135–145)
TCO2: 23 mmol/L (ref 22–32)

## 2022-04-13 LAB — CBC
HCT: 39.2 % (ref 36.0–46.0)
Hemoglobin: 12.9 g/dL (ref 12.0–15.0)
MCH: 27.1 pg (ref 26.0–34.0)
MCHC: 32.9 g/dL (ref 30.0–36.0)
MCV: 82.4 fL (ref 80.0–100.0)
Platelets: 216 10*3/uL (ref 150–400)
RBC: 4.76 MIL/uL (ref 3.87–5.11)
RDW: 14.9 % (ref 11.5–15.5)
WBC: 3.7 10*3/uL — ABNORMAL LOW (ref 4.0–10.5)
nRBC: 0 % (ref 0.0–0.2)

## 2022-04-13 LAB — POCT PREGNANCY, URINE: Preg Test, Ur: NEGATIVE

## 2022-04-13 SURGERY — DILATATION & CURETTAGE/HYSTEROSCOPY WITH MYOSURE
Anesthesia: General | Site: Abdomen

## 2022-04-13 MED ORDER — IBUPROFEN 800 MG PO TABS
800.0000 mg | ORAL_TABLET | Freq: Once | ORAL | Status: AC
  Administered 2022-04-13: 800 mg via ORAL

## 2022-04-13 MED ORDER — SODIUM CHLORIDE 0.9 % IR SOLN
Status: DC | PRN
  Administered 2022-04-13 (×3): 3000 mL

## 2022-04-13 MED ORDER — GLYCOPYRROLATE PF 0.2 MG/ML IJ SOSY
PREFILLED_SYRINGE | INTRAMUSCULAR | Status: AC
  Filled 2022-04-13: qty 1

## 2022-04-13 MED ORDER — PHENYLEPHRINE 80 MCG/ML (10ML) SYRINGE FOR IV PUSH (FOR BLOOD PRESSURE SUPPORT)
PREFILLED_SYRINGE | INTRAVENOUS | Status: AC
  Filled 2022-04-13: qty 20

## 2022-04-13 MED ORDER — SCOPOLAMINE 1 MG/3DAYS TD PT72
MEDICATED_PATCH | TRANSDERMAL | Status: AC
  Filled 2022-04-13: qty 1

## 2022-04-13 MED ORDER — AMISULPRIDE (ANTIEMETIC) 5 MG/2ML IV SOLN: 10.0000 mg | Freq: Once | INTRAVENOUS | Status: DC | PRN

## 2022-04-13 MED ORDER — LIDOCAINE HCL (PF) 2 % IJ SOLN
INTRAMUSCULAR | Status: AC
  Filled 2022-04-13: qty 5

## 2022-04-13 MED ORDER — DEXAMETHASONE SODIUM PHOSPHATE 10 MG/ML IJ SOLN
INTRAMUSCULAR | Status: AC
  Filled 2022-04-13: qty 1

## 2022-04-13 MED ORDER — PROPOFOL 10 MG/ML IV BOLUS
INTRAVENOUS | Status: AC
  Filled 2022-04-13: qty 20

## 2022-04-13 MED ORDER — IBUPROFEN 800 MG PO TABS
ORAL_TABLET | ORAL | Status: AC
  Filled 2022-04-13: qty 1

## 2022-04-13 MED ORDER — ONDANSETRON HCL 4 MG/2ML IJ SOLN
INTRAMUSCULAR | Status: DC | PRN
  Administered 2022-04-13: 4 mg via INTRAVENOUS

## 2022-04-13 MED ORDER — OXYCODONE HCL 5 MG PO TABS
ORAL_TABLET | ORAL | Status: AC
  Filled 2022-04-13: qty 1

## 2022-04-13 MED ORDER — LIDOCAINE 2% (20 MG/ML) 5 ML SYRINGE
INTRAMUSCULAR | Status: DC | PRN
  Administered 2022-04-13: 80 mg via INTRAVENOUS

## 2022-04-13 MED ORDER — KETAMINE HCL 10 MG/ML IJ SOLN
INTRAMUSCULAR | Status: DC | PRN
  Administered 2022-04-13: 10 mg via INTRAVENOUS
  Administered 2022-04-13: 30 mg via INTRAVENOUS

## 2022-04-13 MED ORDER — DEXAMETHASONE SODIUM PHOSPHATE 10 MG/ML IJ SOLN
INTRAMUSCULAR | Status: DC | PRN
  Administered 2022-04-13: 10 mg via INTRAVENOUS

## 2022-04-13 MED ORDER — ACETAMINOPHEN 10 MG/ML IV SOLN
INTRAVENOUS | Status: DC | PRN
  Administered 2022-04-13: 1000 mg via INTRAVENOUS

## 2022-04-13 MED ORDER — DEXMEDETOMIDINE HCL IN NACL 400 MCG/100ML IV SOLN
INTRAVENOUS | Status: DC | PRN
  Administered 2022-04-13: 8 ug via INTRAVENOUS
  Administered 2022-04-13: 4 ug via INTRAVENOUS

## 2022-04-13 MED ORDER — ACETAMINOPHEN 10 MG/ML IV SOLN
INTRAVENOUS | Status: AC
  Filled 2022-04-13: qty 100

## 2022-04-13 MED ORDER — FENTANYL CITRATE (PF) 100 MCG/2ML IJ SOLN
INTRAMUSCULAR | Status: AC
  Filled 2022-04-13: qty 2

## 2022-04-13 MED ORDER — POVIDONE-IODINE 10 % EX SWAB: 2.0000 | Freq: Once | CUTANEOUS | Status: DC

## 2022-04-13 MED ORDER — MIDAZOLAM HCL 2 MG/2ML IJ SOLN
INTRAMUSCULAR | Status: AC
  Filled 2022-04-13: qty 2

## 2022-04-13 MED ORDER — OXYCODONE HCL 5 MG/5ML PO SOLN: 5.0000 mg | Freq: Once | ORAL | Status: AC | PRN

## 2022-04-13 MED ORDER — PROPOFOL 10 MG/ML IV BOLUS
INTRAVENOUS | Status: DC | PRN
  Administered 2022-04-13: 200 mg via INTRAVENOUS
  Administered 2022-04-13: 20 mg via INTRAVENOUS

## 2022-04-13 MED ORDER — LACTATED RINGERS IV SOLN: INTRAVENOUS | Status: DC

## 2022-04-13 MED ORDER — KETAMINE HCL 50 MG/5ML IJ SOSY
PREFILLED_SYRINGE | INTRAMUSCULAR | Status: AC
  Filled 2022-04-13: qty 5

## 2022-04-13 MED ORDER — GLYCOPYRROLATE PF 0.2 MG/ML IJ SOSY
PREFILLED_SYRINGE | INTRAMUSCULAR | Status: DC | PRN
  Administered 2022-04-13: .1 mg via INTRAVENOUS

## 2022-04-13 MED ORDER — ONDANSETRON HCL 4 MG/2ML IJ SOLN
INTRAMUSCULAR | Status: AC
  Filled 2022-04-13: qty 2

## 2022-04-13 MED ORDER — PHENYLEPHRINE 80 MCG/ML (10ML) SYRINGE FOR IV PUSH (FOR BLOOD PRESSURE SUPPORT)
PREFILLED_SYRINGE | INTRAVENOUS | Status: DC | PRN
  Administered 2022-04-13: 80 ug via INTRAVENOUS

## 2022-04-13 MED ORDER — FENTANYL CITRATE (PF) 100 MCG/2ML IJ SOLN
25.0000 ug | INTRAMUSCULAR | Status: DC | PRN
  Administered 2022-04-13: 50 ug via INTRAVENOUS

## 2022-04-13 MED ORDER — ONDANSETRON HCL 4 MG/2ML IJ SOLN: 4.0000 mg | Freq: Once | INTRAMUSCULAR | Status: DC | PRN

## 2022-04-13 MED ORDER — SCOPOLAMINE 1 MG/3DAYS TD PT72
1.0000 | MEDICATED_PATCH | TRANSDERMAL | Status: DC
  Administered 2022-04-13: 1.5 mg via TRANSDERMAL

## 2022-04-13 MED ORDER — MIDAZOLAM HCL 2 MG/2ML IJ SOLN
INTRAMUSCULAR | Status: DC | PRN
  Administered 2022-04-13: 2 mg via INTRAVENOUS

## 2022-04-13 MED ORDER — OXYCODONE HCL 5 MG PO TABS
5.0000 mg | ORAL_TABLET | Freq: Once | ORAL | Status: AC | PRN
  Administered 2022-04-13: 5 mg via ORAL

## 2022-04-13 MED ORDER — FENTANYL CITRATE (PF) 100 MCG/2ML IJ SOLN
INTRAMUSCULAR | Status: DC | PRN
  Administered 2022-04-13: 100 ug via INTRAVENOUS

## 2022-04-13 SURGICAL SUPPLY — 21 items
ABLATOR SURESOUND NOVASURE (ABLATOR) IMPLANT
DEVICE MYOSURE LITE (MISCELLANEOUS) IMPLANT
DEVICE MYOSURE REACH (MISCELLANEOUS) IMPLANT
DRSG ADAPTIC 3X8 NADH LF (GAUZE/BANDAGES/DRESSINGS) IMPLANT
GLOVE BIOGEL PI IND STRL 6.5 (GLOVE) IMPLANT
GLOVE BIOGEL PI IND STRL 7.0 (GLOVE) ×1 IMPLANT
GLOVE ECLIPSE 6.5 STRL STRAW (GLOVE) ×1 IMPLANT
GLOVE SURG SS PI 7.0 STRL IVOR (GLOVE) IMPLANT
GOWN STRL REUS W/TWL LRG LVL3 (GOWN DISPOSABLE) ×1 IMPLANT
IV NS IRRIG 3000ML ARTHROMATIC (IV SOLUTION) ×1 IMPLANT
KIT PROCEDURE FLUENT (KITS) ×1 IMPLANT
KIT TURNOVER CYSTO (KITS) ×1 IMPLANT
MYOSURE XL FIBROID (MISCELLANEOUS)
PACK VAGINAL MINOR WOMEN LF (CUSTOM PROCEDURE TRAY) ×1 IMPLANT
PAD OB MATERNITY 4.3X12.25 (PERSONAL CARE ITEMS) ×1 IMPLANT
PAD PREP 24X48 CUFFED NSTRL (MISCELLANEOUS) ×1 IMPLANT
SEAL ROD LENS SCOPE MYOSURE (ABLATOR) ×1 IMPLANT
SET GENESYS HTA PROCERVA (MISCELLANEOUS) IMPLANT
SUT CHROMIC 3 0 SH 27 (SUTURE) IMPLANT
SYSTEM TISS REMOVAL MYOSURE XL (MISCELLANEOUS) IMPLANT
TOWEL OR 17X26 10 PK STRL BLUE (TOWEL DISPOSABLE) ×1 IMPLANT

## 2022-04-13 NOTE — H&P (Signed)
Jessica Roberson is an 41 y.o. female. [id number] MBF hx TL presents for surgical mgmt of menorrhagia. Pt has known uterine fibroids. Pt is scheduled for dx hysteroscopy, possible resection of SM fibroid using myosure, novasure endometrial ablation  Pertinent Gynecological History: Menses: flow is excessive with use of 6 pads or tampons on heaviest days Bleeding: menorrhagia Contraception: tubal ligation DES exposure: denies Blood transfusions: none Sexually transmitted diseases: no past history Previous GYN Procedures: TL  Last mammogram: normal Date: 2023 Last pap: normal Date: 2023 OB History: G6, P2   Menstrual History: Menarche age: n/a Patient's last menstrual period was 03/15/2022 (exact date).    Past Medical History:  Diagnosis Date   Anemia    Anxiety    Family history of breast cancer    Family history of leukemia    Family history of ovarian cancer    Family history of prostate cancer    Family history of thyroid cancer    Hirsutism    History of anal fissures    History of gastric ulcer 09/2009   History of migraine    History of pregnancy induced hypertension 2008   Menorrhagia with regular cycle    Multiple sclerosis [facility]) 2006   neurologist--- dr Felecia Shelling;  dx 2006,  gait stable, myalgia   Myalgia    OA (osteoarthritis)    right knee   Obesity    PCOS (polycystic ovarian syndrome)    Uterine fibroid    Wears glasses     Past Surgical History:  Procedure Laterality Date   CARPAL TUNNEL RELEASE Left 12/31/2013   via endoscopy and excision gangiion cyst   CESAREAN SECTION  03/24/2007   _0  by dr d. Corinna Capra   CESAREAN SECTION N/A 04/02/2013   Procedure: REPEAT CESAREAN SECTION;  Surgeon: Eldred Manges, MD;  Location: [facility];  Service: Obstetrics;  Laterality: N/A;   CHOLECYSTECTOMY, LAPAROSCOPIC  01/2006   _1    DILATION AND CURETTAGE OF UTERUS  11/25/2000   @ [facility];   W/  SUCTION   DILATION AND EVACUATION  05/05/2012   Procedure: DILATATION AND  EVACUATION;  Surgeon: Alwyn Pea, MD;  Location: [facility];  Service: Gynecology;  Laterality: N/A;   FOOT TENDON TRANSFER Left 2004   HAMMER TOE SURGERY  09/22/2009   _2  by dr Milinda Pointer;   recorrection of second, third, fourth, and fifth toes   INGUINAL HERNIA REPAIR Left 1984   KNEE ARTHROSCOPY Right 07/09/2001   LCL RECONSTRUCTION (by dr Ronnie Derby)   KNEE ARTHROSCOPY Right 04/09/2001   LCL RECONSTRUCTION AND PCL REPAIR (by dr Ronnie Derby)   KNEE ARTHROSCOPY W/ ACL RECONSTRUCTION Right 2002   AND PCL RECONSTRUCTIONS BY DR Ronnie Derby   TOTAL KNEE ARTHROPLASTY Right 11/09/2019   Procedure: TOTAL KNEE ARTHROPLASTY;  Surgeon: Vickey Huger, MD;  Location: [facility];  Service: Orthopedics;  Laterality: Right;    Family History  Problem Relation Age of Onset   Stroke Mother    Kidney disease Mother        ESRD   Multiple sclerosis Mother    Depression Mother    Breast cancer Mother 44   Ovarian cancer Mother 86       maybe ut?   Thyroid cancer Mother 64       reports it was fairly 'devastating, possibly aggresive one'   Bone cancer Mother 51       unsure if a separate primary or met   Hypertension Father    Diabetes Father  type II   Sarcoidosis Father    Asthma Daughter    Hypertension Maternal Grandmother    Diabetes Maternal Grandmother    Bone cancer Maternal Grandmother 65   Diabetes Paternal Grandfather    Alzheimer's disease Paternal Grandfather    Breast cancer Other 5   Prostate cancer Maternal Uncle 64       no surgery   Lung cancer Maternal Grandfather 18   Leukemia Paternal Grandmother 43       died at 49   Breast cancer Cousin 2    Social History:  reports that she has never smoked. She has never used smokeless tobacco. She reports current alcohol use. She reports that she does not use drugs.  Allergies:  Allergies  Allergen Reactions   Aspirin Anaphylaxis   Penicillins Anaphylaxis   Cyclobenzaprine Itching    Bad dreams   Hydromorphone Hcl Itching   Tramadol  Hcl Hives    Medications Prior to Admission  Medication Sig Dispense Refill Last Dose   baclofen (LIORESAL) 10 MG tablet TAKE 1/2 TO 1 TABLET BY MOUTH 3 TIMES A DAY AS NEEDED FOR SPASTICITY. (Patient taking differently: Take 5-10 mg by mouth 3 (three) times daily as needed. TAKE 1/2 TO 1 TABLET BY MOUTH 3 TIMES A DAY AS NEEDED FOR SPASTICITY.) 270 tablet 0 04/12/2022   Cholecalciferol (VITAMIN D-3 PO) Take 1 capsule by mouth daily.   04/12/2022   LORazepam (ATIVAN) 1 MG tablet TAKE 1 TABLET (1 MG TOTAL) BY MOUTH AT BEDTIME AS NEEDED FOR SLEEP. Call [phone] to schedule follow up for ongoing refills (Patient taking differently: Take 1 mg by mouth at bedtime as needed. TAKE 1 TABLET (1 MG TOTAL) BY MOUTH AT BEDTIME AS NEEDED FOR SLEEP. Call [phone] to schedule follow up for ongoing refills) 30 tablet 0 Past Month   Multiple Vitamins-Minerals (MULTIVITAMIN WOMENS 50+ ADV) TABS Take 1 tablet by mouth daily.   04/12/2022   spironolactone (ALDACTONE) 50 MG tablet Take 50 mg by mouth daily.   Past Week   tiZANidine (ZANAFLEX) 4 MG tablet TAKE 1 TABLET BY MOUTH 3 TIMES A DAY FOR MUSCLE SPASMS (Patient taking differently: Take 4 mg by mouth 3 (three) times daily as needed. TAKE 1 TABLET BY MOUTH 3 TIMES A DAY FOR MUSCLE SPASMS) 270 tablet 1 04/12/2022   sertraline (ZOLOFT) 50 MG tablet Take 1 tablet (50 mg total) by mouth daily. (Patient not taking: Reported on 04/11/2022) 90 tablet 3 More than a month    Review of Systems  All other systems reviewed and are negative.   Blood pressure 129/77, pulse 65, temperature 97.8 F (36.6 C), temperature source Oral, resp. rate 18, height _0  (1.753 m), weight 119 kg, last menstrual period 03/15/2022, SpO2 100 %, unknown if currently breastfeeding. Physical Exam Constitutional:      Appearance: Normal appearance. She is obese.  HENT:     Head: Atraumatic.  Eyes:     Extraocular Movements: Extraocular movements intact.  Cardiovascular:     Rate and  Rhythm: Regular rhythm.     Heart sounds: Normal heart sounds.  Pulmonary:     Breath sounds: Normal breath sounds.  Abdominal:     Palpations: Abdomen is soft.     Comments: pfannenstiel  Genitourinary:    General: Normal vulva.     Comments: Vagina nl Cervix nulliparous Uterus AV bulky Adnexa nonpalpable Musculoskeletal:        General: Normal range of motion.     Cervical back: Neck  supple.  Skin:    General: Skin is warm and dry.  Neurological:     General: No focal deficit present.     Mental Status: She is alert and oriented to person, place, and time.  Psychiatric:        Mood and Affect: Mood normal.        Behavior: Behavior normal.     Results for orders placed or performed during the hospital encounter of 04/13/22 (from the past 24 hour(s))  Pregnancy, urine POC     Status: None   Collection Time: 04/13/22  8:50 AM  Result Value Ref Range   Preg Test, Ur NEGATIVE NEGATIVE    No results found.  Assessment/Plan: Menorrhagia  SM fibroid P) dx hysteroscopy, resection of SM fibroid, D&C, novasure endometrial ablation. Procedure explained, risk of surgery reviewed including infection, bleeding, injury to surrounding organ structures, uterine perforation and its risk, thermal injury, fluid overload and its mgmt, 90% reduction in flow. All ? answered  Lemon Sternberg A Yanina Knupp 04/13/2022, 9:09 AM

## 2022-04-13 NOTE — Discharge Instructions (Signed)

## 2022-04-13 NOTE — Anesthesia Procedure Notes (Signed)
Procedure Name: LMA Insertion Date/Time: 04/13/2022 11:16 AM  Performed by: Mechele Claude, CRNAPre-anesthesia Checklist: Patient identified, Emergency Drugs available, Suction available and Patient being monitored Patient Re-evaluated:Patient Re-evaluated prior to induction Oxygen Delivery Method: Circle system utilized Preoxygenation: Pre-oxygenation with 100% oxygen Induction Type: IV induction Ventilation: Mask ventilation without difficulty LMA: LMA inserted LMA Size: 4.0 Number of attempts: 1 Airway Equipment and Method: Bite block Placement Confirmation: positive ETCO2 Tube secured with: Tape Dental Injury: Teeth and Oropharynx as per pre-operative assessment

## 2022-04-13 NOTE — Transfer of Care (Signed)
Immediate Anesthesia Transfer of Care Note  Patient: Jessica Roberson  Procedure(s) Performed: Procedure(s) (LRB): HYSTEROSCOPY WITH MYOSURE; DILATION AND CURETTAGE (N/A)  Patient Location: PACU  Anesthesia Type: General  Level of Consciousness: awake, alert  and oriented  Airway & Oxygen Therapy: Patient Spontanous Breathing and Patient connected to nasal cannula oxygen  Post-op Assessment: Report given to PACU RN and Post -op Vital signs reviewed and stable  Post vital signs: Reviewed and stable  Complications: No apparent anesthesia complications  Last Vitals:  Vitals Value Taken Time  BP    Temp    Pulse 77 04/13/22 1248  Resp 17 04/13/22 1248  SpO2 99 % 04/13/22 1248  Vitals shown include unvalidated device data.  Last Pain:  Vitals:   04/13/22 0905  TempSrc: Oral  PainSc: 0-No pain      Patients Stated Pain Goal: 5 (29/93/71 6967)  Complications: No notable events documented.

## 2022-04-13 NOTE — Anesthesia Postprocedure Evaluation (Signed)
Anesthesia Post Note  Patient: Jessica Roberson  Procedure(s) Performed: HYSTEROSCOPY WITH MYOSURE RESECTION; DILATION AND CURETTAGE (Abdomen)     Patient location during evaluation: PACU Anesthesia Type: General Level of consciousness: awake and alert and oriented Pain management: pain level controlled Vital Signs Assessment: post-procedure vital signs reviewed and stable Respiratory status: spontaneous breathing, nonlabored ventilation and respiratory function stable Cardiovascular status: blood pressure returned to baseline and stable Postop Assessment: no apparent nausea or vomiting Anesthetic complications: no   No notable events documented.  Last Vitals:  Vitals:   04/13/22 1321 04/13/22 1330  BP:    Pulse: 66 (!) 59  Resp: 18 14  Temp:    SpO2: 100% 99%    Last Pain:  Vitals:   04/13/22 1300  TempSrc:   PainSc: 7                  Emillee Talsma A.

## 2022-04-13 NOTE — Anesthesia Preprocedure Evaluation (Signed)
Anesthesia Evaluation  Patient identified by MRN, date of birth, ID band Patient awake    Reviewed: Allergy & Precautions, H&P , NPO status , Patient's Chart, lab work & pertinent test results, reviewed documented beta blocker date and time   Airway Mallampati: III  TM Distance: >3 FB Neck ROM: Full    Dental no notable dental hx. (+) Teeth Intact   Pulmonary sleep apnea  Hx/o Covid 05/2019   Pulmonary exam normal breath sounds clear to auscultation       Cardiovascular hypertension, Pt. on medications Normal cardiovascular exam Rhythm:Regular Rate:Normal     Neuro/Psych  Headaches PSYCHIATRIC DISORDERS Anxiety Depression    MS under control  Neuromuscular disease    GI/Hepatic Neg liver ROS,GERD  Medicated and Controlled,,  Endo/Other  PCOS   Renal/GU negative Renal ROS  negative genitourinary   Musculoskeletal  (+) Arthritis ,    Abdominal  (+) + obese  Peds  Hematology  (+) Blood dyscrasia, anemia   Anesthesia Other Findings   Reproductive/Obstetrics Menorrhagia Submucosal and Intramural fibroids HSV                              Anesthesia Physical Anesthesia Plan  ASA: 3  Anesthesia Plan: General   Post-op Pain Management: Minimal or no pain anticipated, Tylenol PO (pre-op)*, Precedex and Ketamine IV*   Induction:   PONV Risk Score and Plan: 4 or greater and Treatment may vary due to age or medical condition, Midazolam, Dexamethasone and Ondansetron  Airway Management Planned: Natural Airway  Additional Equipment: None  Intra-op Plan:   Post-operative Plan:   Informed Consent: I have reviewed the patients History and Physical, chart, labs and discussed the procedure including the risks, benefits and alternatives for the proposed anesthesia with the patient or authorized representative who has indicated his/her understanding and acceptance.     Dental advisory  given  Plan Discussed with: CRNA and Anesthesiologist  Anesthesia Plan Comments:          Anesthesia Quick Evaluation

## 2022-04-13 NOTE — Brief Op Note (Signed)
04/13/2022  12:40 PM  PATIENT:  Jessica Roberson  41 y.o. female  PRE-OPERATIVE DIAGNOSIS:  menorrhagia with regular cycles, submucosal and intramural fibroids  POST-OPERATIVE DIAGNOSIS:  menorrhagia with regular cycles, submucosal and intramural fibroids  PROCEDURE:  diagnostic hysteroscopy, hysteroscopic resection of SM fibroids using myosure, attempted Novasure endometrial ablation  SURGEON:  Surgeon(s) and Role:    * Servando Salina, MD - Primary  PHYSICIAN ASSISTANT:   ASSISTANTS: none   ANESTHESIA:   general Findings :  lus posterior SM fibroid, left upper posterior/lateral fibroid EBL:  50 mL   BLOOD ADMINISTERED:none  DRAINS: none   LOCAL MEDICATIONS USED:  NONE  SPECIMEN:  Source of Specimen:  EMC with fibroid resections  DISPOSITION OF SPECIMEN:  PATHOLOGY  COUNTS:  YES  TOURNIQUET:  * No tourniquets in log *  DICTATION: .Other Dictation: Dictation Number [id number]  PLAN OF CARE: Discharge to home after PACU  PATIENT DISPOSITION:  PACU - hemodynamically stable.   Delay start of Pharmacological VTE agent (>24hrs) due to surgical blood loss or risk of bleeding: no

## 2022-04-14 NOTE — Op Note (Unsigned)
Jessica Roberson, Jessica Roberson MEDICAL RECORD NO: [id number] ACCOUNT NO: [id number] DATE OF BIRTH: 08/05/1980 FACILITY: [facility] LOCATION: [facility] PHYSICIAN: Chuk[place]uma Straus A. Garwin Brothers, MD  Operative Report   DATE OF PROCEDURE: 04/13/2022  PREOPERATIVE DIAGNOSES:  Menorrhagia with regular cycles, submucosal fibroid.  PROCEDURE:  Diagnostic hysteroscopy, hysteroscopic resection of submucosal fibroid using MyoSure, dilation and curettage, attempted NovaSure endometrial ablation.  POSTOPERATIVE DIAGNOSES:  Menorrhagia with regular cycles, submucosal fibroid.  ANESTHESIA:  General.  SURGEON:  Raina Sole A. Garwin Brothers, MD.  ASSISTANT:  None.  DESCRIPTION OF PROCEDURE:  Under adequate general anesthesia, the patient was placed in the dorsal lithotomy position.  She was sterilely prepped and draped in the usual fashion.  Bladder was catheterized for a moderate amount of urine.  Examination  under anesthesia revealed a bulky, irregular, anteverted uterus, no adnexal masses could be appreciated.  Bivalve speculum was placed in the vagina.  Cervix was pinpoint os.  Cervix was dilated.  Uterine sound was placed and the initial length of 10 cm  was noted.  This endocervical canal length was 3 cm.  Recheck cervical length was the same, but the endocervical canal was 3.5 cm, making a uterine length of 6.5 cm.  The diagnostic hysteroscope was then inserted.  It was then noted on the left posterior  lower uterine segment was a submucosal fibroid, which probably would not be reached by an ablation. At that point, decision was made to proceed with a resection of that fibroid, which was done using the Reach resectoscope.  Once this was done, the  diagnostic hysteroscope was removed, the cervix was further dilated in order to accommodate the NovaSure endometrial apparatus.  With the apparatus being placed, the width could not be calculated.  The machine failed, could not be calculated.  Once it  was able to calculate at  2.5, the apparatus for the ablation started but failed. The NovaSure apparatus was then removed.  The diagnostic hysteroscope was reinserted.  Once that was reinserted, it was then noted there was another fibroid that popped up on the  posterior left upper quadrant of the uterine cavity. The Reach resectoscope was then reinserted and that fibroid had been resected.  Once that was done, the hysteroscope was removed.  A new NovaSure apparatus was reinserted as the tubing had been filled with fluid.   Ultimately with the 3 cm at that point, the procedure was started, but  ablation apparatus again failed.  At that point, decision made was to switch to the hydrothermal ablation apparatus. Despite dilating the cervix further, the apparatus could not traverse the  endocervical canal in order to do the procedure. That was removed and the hysteroscope was inserted, but due to clotted material in the uterine cavity, visibility was not clear. At that point, decision was made to terminate the procedure further. All  instruments were then removed from the vagina.  SPECIMEN:  The endometrial curettings with fibroid resection, sent to pathology.  ESTIMATED BLOOD LOSS:  Probably 10 mL.  FLUID DEFICIT: 601 ml  COMPLICATIONS:  None.  The patient tolerated the procedure well, was transferred to recovery room in stable condition.      PAA D: 04/13/2022 11:45:22 pm T: 04/14/2022 12:42:00 am  JOB: [id number]/ [id number]

## 2022-04-17 LAB — SURGICAL PATHOLOGY

## 2022-04-18 ENCOUNTER — Encounter (HOSPITAL_BASED_OUTPATIENT_CLINIC_OR_DEPARTMENT_OTHER): Payer: Self-pay | Admitting: Obstetrics and Gynecology

## 2022-05-21 ENCOUNTER — Other Ambulatory Visit: Payer: Self-pay | Admitting: Neurology

## 2022-05-22 NOTE — Telephone Encounter (Signed)
Follow up appointment scheduled on 07/31/22.

## 2022-06-01 ENCOUNTER — Telehealth: Payer: Self-pay | Admitting: Family Medicine

## 2022-06-01 NOTE — Telephone Encounter (Signed)
Pt called back. Requesting a call back to schedule MRI.

## 2022-06-04 NOTE — Telephone Encounter (Signed)
Scheduled at [facility] 2/27 at 8:30am  60 mins MR brain w/wo Amy L. [employer] Josem Kaufmann: [id number] exp. 05/31/22-06/29/22

## 2022-06-19 ENCOUNTER — Ambulatory Visit: Payer: BC Managed Care – PPO

## 2022-06-19 DIAGNOSIS — G35 Multiple sclerosis: Secondary | ICD-10-CM

## 2022-06-19 MED ORDER — GADOBENATE DIMEGLUMINE 529 MG/ML IV SOLN
20.0000 mL | Freq: Once | INTRAVENOUS | Status: AC | PRN
  Administered 2022-06-19: 20 mL via INTRAVENOUS

## 2022-06-25 ENCOUNTER — Telehealth: Payer: Self-pay | Admitting: Neurology

## 2022-06-25 NOTE — Telephone Encounter (Signed)
Called Pharmacy and verified prescription. Pharmacy verbalized understanding.

## 2022-06-25 NOTE — Telephone Encounter (Signed)
Jessica Roberson is calling from [employer]. Stated she needs to verify prescription baclofen (LIORESAL) 10 MG tablet. Asking will the baclofen (LIORESAL) 10 MG tablet  replace the tiZANidine (ZANAFLEX) 4 MG tablet. She is requesting a call back from nurse.

## 2022-07-30 NOTE — Progress Notes (Deleted)
[facility]  PATIENT: Jessica Roberson DOB: 29-Oct-1980  REFERRING DOCTOR OR PCP:  Sandford Craze SOURCE: patient  _________________________________   HISTORICAL  CHIEF COMPLAINT:  Chief Complaint  Patient presents with   Follow-up    Rm 1, alone. Here for MS f/u, on Lemtrada. Last seen 04/04/20. Pt reports doing well overall. When highly stressed/anxious pt reports having muscle spasms that start on R side of neck and radiating down to arm and side of face. Tizanidine and baclofen help lesson but doesn't completely stop sx.     HISTORY OF PRESENT ILLNESS:  Jessica Roberson is a 42 y.o. woman with MS .   Update 07/30/2022:  She had Lemtrada October 2018 and October 2019.   She is compliant with the REMS program.    Labs have not shown any autoimmune issues.  Her MS is stable and she notes no exacerbation.     She was unable to get an  MRI last year for insurance issues.    She feels gait and strength are doing better since her TKR.  She has a mild right foot drop, especially in hot weather outdoors and when tired.     She has mild right leg and neck/muscle spasm, worse when stressed.  She has some pain in the right arm when the neck stiffens.   She has fatigue many days.   She sleeps well most nights.  She has mild depression and also gets anxiety.  She is not on any antidepressant currently.  Cognition is fine.  She works from home.   She may have to start traveling soon.  She notes pain in muscles, mostly in the neck and back.  Pain radiates into the back of the right head.   She tried ibuprofen and tylenol alternating without much benefit.    Tizanidine helps the myalgias better than baclofen but makes her sleepy.   She takes baclofen once a day also.      MS History:   She was diagnosed with multiple sclerosis in 2006 after presenting with left facial numbness and vision changes. MRIs of the brain were consistent with MS. She also had a lumbar puncture  in the spinal fluid was consistent with MS. She initially saw a Dr. at [facility] and then began to see Dr. Leotis Shames at [facility].   She was placed on Betaseron but switched to Copaxone due to injection site reactions. Unfortunately, she also had injection site reactions on Copaxone. Around 2010, she was started on Tysabri.  Due to a concern about PML, she wished to switch therapy. She was screened for a drug study but could not get the MRI's due to braces. She started Gilenya in 2012. Done well on that therapy she stopped twice, once for insurance reasons and once for pregnancy and resumed therapy after each pause. She has not had any definite exacerbations while on Gilenya. She tolerates it very well. She had optic neuritis in 2017.   MRI 08/2016 showed a new focus not present in 2016.  She was switched to [place].  She had the first year October 2018 and the second year October 2019.   Imaging: MRI cervical spine 01/16/2018 shows "At C4 level, there is a posterior spinal cord chronic demyelinating plaque.  No acute plaques"  MRI brain 01/16/2018 shows "Multiple supratentorial and infratentorial chronic demyelinating plaques. No acute plaques. No change from MRI on 09/30/14"  MRI of the brain 06/19/2022 was stable compared to the 2019 MRI.  REVIEW OF SYSTEMS: Constitutional: No fevers, chills, sweats, or change in appetite.  She notes a lot more fatigue. Eyes: see above Ear, nose and throat: No hearing loss, ear pain, nasal congestion, sore throat Cardiovascular: No chest pain, palpitations Respiratory:  No shortness of breath at rest or with exertion.   No wheezes GastrointestinaI: No nausea, vomiting, diarrhea, abdominal pain, fecal incontinence Genitourinary:  No dysuria, urinary retention or frequency.  No nocturia. Musculoskeletal: She reports neck pain and some muscle aches Integumentary: No rash, pruritus, skin lesions Neurological: as above Psychiatric: No depression at this  time.  No anxiety Endocrine: No palpitations, diaphoresis, change in appetite, change in weigh or increased thirst Hematologic/Lymphatic:  No anemia, purpura, petechiae. Allergic/Immunologic: No itchy/runny eyes, nasal congestion, recent allergic reactions, rashes  ALLERGIES: Allergies  Allergen Reactions   Aspirin Anaphylaxis   Penicillins Anaphylaxis   Cyclobenzaprine Itching    Bad dreams   Hydromorphone Hcl Itching   Tramadol Hcl Hives    HOME MEDICATIONS:  Current Outpatient Medications:    baclofen (LIORESAL) 10 MG tablet, TAKE 1/2 TO 1 TABLET BY MOUTH 3 TIMES A DAY AS NEEDED FOR SPASTICITY., Disp: 90 tablet, Rfl: 2   LORazepam (ATIVAN) 1 MG tablet, TAKE 1 TABLET (1 MG TOTAL) BY MOUTH AT BEDTIME AS NEEDED FOR SLEEP. Call [phone] to schedule follow up for ongoing refills, Disp: 30 tablet, Rfl: 0   sertraline (ZOLOFT) 50 MG tablet, Take 1 tablet (50 mg total) by mouth daily., Disp: 90 tablet, Rfl: 3   spironolactone (ALDACTONE) 50 MG tablet, Take 50 mg by mouth daily., Disp: , Rfl:    tiZANidine (ZANAFLEX) 4 MG tablet, TAKE 1 TABLET BY MOUTH 3 TIMES A DAY FOR MUSCLE SPASMS, Disp: 90 tablet, Rfl: 2  PAST MEDICAL HISTORY: Past Medical History:  Diagnosis Date   Allergy    Anal fissure    Anxiety    Arthritis    right knee   Colon polyp 2005   Depression    resolved - situational   Family history of breast cancer    Family history of leukemia    Family history of ovarian cancer    Family history of prostate cancer    Family history of thyroid cancer    Migraine    history - otc med prn   Multiple sclerosis (HCC)    Multiple sclerosis (HCC)    Neuromuscular disorder (HCC)    Multiple sclerosis - no meds with 2014 pregnancy   Obesity    PCOS (polycystic ovarian syndrome)    Pregnancy induced hypertension 03/2007   Resolved - PIH with 2008 pregnancy    Ulcer 09/2009   Vision abnormalities     PAST SURGICAL HISTORY: Past Surgical History:  Procedure  Laterality Date   carpel tunnel surery Left    CESAREAN SECTION  2008   wh   CESAREAN SECTION N/A 04/02/2013   Procedure: REPEAT CESAREAN SECTION;  Surgeon: Hal Morales, MD;  Location: [facility];  Service: Obstetrics;  Laterality: N/A;   CHOLECYSTECTOMY  2007   DILATION AND CURETTAGE OF UTERUS     DILATION AND EVACUATION  05/05/2012   Procedure: DILATATION AND EVACUATION;  Surgeon: Esmeralda Arthur, MD;  Location: [facility];  Service: Gynecology;  Laterality: N/A;   FOOT SURGERY  2004,2011   x 2 right foot   FOOT TENDON TRANSFER  2004   HAMMER TOE SURGERY  2011   HERNIA REPAIR  1984   KNEE ARTHROSCOPY     x 3 -  right knee   TOTAL KNEE ARTHROPLASTY Right 11/09/2019   Procedure: TOTAL KNEE ARTHROPLASTY;  Surgeon: Dannielle Huh, MD;  Location: [facility];  Service: Orthopedics;  Laterality: Right;    FAMILY HISTORY: Family History  Problem Relation Age of Onset   Stroke Mother    Kidney disease Mother        ESRD   Multiple sclerosis Mother    Depression Mother    Breast cancer Mother 61   Ovarian cancer Mother 71       maybe ut?   Thyroid cancer Mother 68       reports it was fairly 'devastating, possibly aggresive one'   Bone cancer Mother 77       unsure if a separate primary or met   Hypertension Father    Diabetes Father        type II   Sarcoidosis Father    Asthma Daughter    Hypertension Maternal Grandmother    Diabetes Maternal Grandmother    Bone cancer Maternal Grandmother 86   Diabetes Paternal Grandfather    Alzheimer's disease Paternal Grandfather    Breast cancer Other 50   Prostate cancer Maternal Uncle 20       no surgery   Lung cancer Maternal Grandfather 72   Leukemia Paternal Grandmother 63       died at 69   Breast cancer Cousin 42    SOCIAL HISTORY:  Social History   Socioeconomic History   Marital status: Married    Spouse name: Not on file   Number of children: Not on file   Years of education: Not on file   Highest education level: Not on  file  Occupational History   Not on file  Tobacco Use   Smoking status: Never   Smokeless tobacco: Never  Vaping Use   Vaping Use: Never used  Substance and Sexual Activity   Alcohol use: Not Currently    Comment: occasional but none with pregnancy   Drug use: No   Sexual activity: Yes    Birth control/protection: None    Comment: preganat  Other Topics Concern   Not on file  Social History Narrative   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   Lives with boyfriend   Regular exercise: yes   Social Determinants of Health   Financial Resource Strain: Not on file  Food Insecurity: Not on file  Transportation Needs: Not on file  Physical Activity: Not on file  Stress: Not on file  Social Connections: Not on file  Intimate Partner Violence: Not on file     PHYSICAL EXAM  Vitals:   07/13/21 1106  BP: 122/76  Pulse: 61  Weight: 252 lb (114.3 kg)  Height:  (1.753 m)     Body mass index is 37.21 kg/m.   General: The patient is well-developed and well-nourished and in no acute distress.  Head and neck: The head is normocephalic and atraumatic.  She is tender over the right occiput, upper to mid cervical paraspinal muscles and trapezius muscle.  Range of motion is slightly reduced.  Neurologic Exam  Mental status: The patient is alert and oriented x 3 at the time of the examination. The patient has apparent normal recent and remote memory, with an apparently normal attention span and concentration ability.   Speech is normal.  Cranial nerves:   Extraocular muscles are normal.  Facial strength is normal.  Trapezius strength is normal.  No obvious hearing deficits are noted.  Motor:  Muscle bulk is normal.   Tone is mildly increased in right leg. Strength is  5 / 5 in the arms and left leg and proximal right leg and 4+/5 right foot/ankle..   Sensory: Sensory testing is intact proximally in the arms and legs  Coordination:  She has good finger-nose-finger bilaterally.  Heel-to-shin was performed normally on the left and slightly reduced on the right.  Gait and station: Station is normal.   Her gait is normal though the tandem gait is wide.  Romberg is negative.  Reflexes: Deep tendon reflexes are symmetric and normal bilaterally.       DIAGNOSTIC DATA (LABS, IMAGING, TESTING) - I reviewed patient records, labs, notes, testing and imaging myself where available.  Lab Results  Component Value Date   WBC 4.2 11/09/2019   HGB 13.4 11/09/2019   HCT 40.2 11/09/2019   MCV 83.4 11/09/2019   PLT 224 11/09/2019      Component Value Date/Time   NA 142 04/11/2021 1035   K 4.6 04/11/2021 1035   CL 105 04/11/2021 1035   CO2 24 04/11/2021 1035   GLUCOSE 79 04/11/2021 1035   GLUCOSE 97 11/09/2019 1140   BUN 9 04/11/2021 1035   CREATININE 0.82 04/11/2021 1035   CALCIUM 9.1 04/11/2021 1035   PROT 7.3 11/09/2019 1140   PROT 6.9 01/14/2017 1438   ALBUMIN 4.1 11/09/2019 1140   ALBUMIN 4.3 01/14/2017 1438   AST 20 11/09/2019 1140   ALT 16 11/09/2019 1140   ALKPHOS 35 (L) 11/09/2019 1140   BILITOT 0.5 11/09/2019 1140   BILITOT 0.4 01/14/2017 1438   GFRNONAA >60 11/09/2019 1140   GFRAA >60 11/09/2019 1140   Lab Results  Component Value Date   CHOL 182 03/19/2017   HDL 59.20 03/19/2017   LDLCALC 110 (H) 03/19/2017   TRIG 65.0 03/19/2017   CHOLHDL 3 03/19/2017   Lab Results  Component Value Date   HGBA1C 5.2 03/19/2017  DVT Lab Results  Component Value Date   VITAMINB12 330 09/27/2009   Lab Results  Component Value Date   TSH 0.539 08/29/2017       ASSESSMENT AND PLAN  Multiple sclerosis (HCC) - Plan: MR BRAIN W WO CONTRAST  High risk medication use  Neck pain  Other fatigue  Myalgia  Gait disturbance   1.  She has completed the first and second years of [place]ada.  She will continue the monthly monitoring until October 2023.   Check MRI of the brain to determine if there is any  subclinical progression.  If this is occurring we would need to add a disease modifying therapy back or do another round of Lemtrada.   2.  Sertraline for mood 3.  stay active and exercise.    4.  Continue tizanidine/balcofen for spasms/myalgia 5.   RTC 6 months, call sooner if new or worsening symptoms or other issues.   Derrick Tiegs A. Epimenio FootSater, MD, PhD 07/13/2021, 11:28 AM Certified in Neurology, Clinical Neurophysiology, Sleep Medicine, Pain Medicine and Neuroimaging  [facility] [address], [address] [place], [place] [address] [phone]

## 2022-07-31 ENCOUNTER — Encounter: Payer: Self-pay | Admitting: Neurology

## 2022-07-31 ENCOUNTER — Ambulatory Visit: Payer: BC Managed Care – PPO | Admitting: Neurology

## 2022-12-07 ENCOUNTER — Other Ambulatory Visit: Payer: Self-pay | Admitting: Family Medicine

## 2022-12-07 DIAGNOSIS — N921 Excessive and frequent menstruation with irregular cycle: Secondary | ICD-10-CM

## 2022-12-12 ENCOUNTER — Ambulatory Visit
Admission: RE | Admit: 2022-12-12 | Discharge: 2022-12-12 | Disposition: A | Discharge status: Home / Self Care | Payer: BC Managed Care – PPO | Source: Ambulatory Visit | Attending: Family Medicine | Admitting: Family Medicine

## 2022-12-12 DIAGNOSIS — N921 Excessive and frequent menstruation with irregular cycle: Secondary | ICD-10-CM

## 2023-01-28 ENCOUNTER — Telehealth: Payer: Self-pay | Admitting: *Deleted

## 2023-01-28 DIAGNOSIS — F321 Major depressive disorder, single episode, moderate: Secondary | ICD-10-CM | POA: Diagnosis not present

## 2023-01-28 NOTE — Telephone Encounter (Signed)
Pt medical records faxed to parameds on 01/28/23

## 2023-02-01 DIAGNOSIS — R7309 Other abnormal glucose: Secondary | ICD-10-CM | POA: Diagnosis not present

## 2023-02-04 DIAGNOSIS — F321 Major depressive disorder, single episode, moderate: Secondary | ICD-10-CM | POA: Diagnosis not present

## 2023-02-11 DIAGNOSIS — F321 Major depressive disorder, single episode, moderate: Secondary | ICD-10-CM | POA: Diagnosis not present

## 2023-02-19 DIAGNOSIS — F321 Major depressive disorder, single episode, moderate: Secondary | ICD-10-CM | POA: Diagnosis not present

## 2023-02-26 DIAGNOSIS — R7309 Other abnormal glucose: Secondary | ICD-10-CM | POA: Diagnosis not present

## 2023-02-27 DIAGNOSIS — N920 Excessive and frequent menstruation with regular cycle: Secondary | ICD-10-CM | POA: Diagnosis not present

## 2023-02-27 DIAGNOSIS — E6609 Other obesity due to excess calories: Secondary | ICD-10-CM | POA: Diagnosis not present

## 2023-02-27 DIAGNOSIS — R7309 Other abnormal glucose: Secondary | ICD-10-CM | POA: Diagnosis not present

## 2023-03-04 DIAGNOSIS — F321 Major depressive disorder, single episode, moderate: Secondary | ICD-10-CM | POA: Diagnosis not present

## 2023-03-11 DIAGNOSIS — F321 Major depressive disorder, single episode, moderate: Secondary | ICD-10-CM | POA: Diagnosis not present

## 2023-03-12 ENCOUNTER — Encounter: Payer: Self-pay | Admitting: Neurology

## 2023-03-12 DIAGNOSIS — R202 Paresthesia of skin: Secondary | ICD-10-CM | POA: Diagnosis not present

## 2023-03-14 ENCOUNTER — Encounter: Payer: Self-pay | Admitting: Neurology

## 2023-03-14 ENCOUNTER — Ambulatory Visit (INDEPENDENT_AMBULATORY_CARE_PROVIDER_SITE_OTHER): Payer: 59 | Admitting: Neurology

## 2023-03-14 VITALS — BP 117/76 | HR 78 | Ht 69.0 in | Wt 241.0 lb

## 2023-03-14 DIAGNOSIS — Z79899 Other long term (current) drug therapy: Secondary | ICD-10-CM | POA: Diagnosis not present

## 2023-03-14 DIAGNOSIS — G35 Multiple sclerosis: Secondary | ICD-10-CM | POA: Diagnosis not present

## 2023-03-14 DIAGNOSIS — R5383 Other fatigue: Secondary | ICD-10-CM

## 2023-03-14 DIAGNOSIS — R269 Unspecified abnormalities of gait and mobility: Secondary | ICD-10-CM | POA: Diagnosis not present

## 2023-03-14 DIAGNOSIS — M79602 Pain in left arm: Secondary | ICD-10-CM

## 2023-03-14 DIAGNOSIS — R29898 Other symptoms and signs involving the musculoskeletal system: Secondary | ICD-10-CM | POA: Diagnosis not present

## 2023-03-14 DIAGNOSIS — R200 Anesthesia of skin: Secondary | ICD-10-CM | POA: Diagnosis not present

## 2023-03-14 MED ORDER — METHYLPREDNISOLONE 4 MG PO TABS: ORAL_TABLET | ORAL | 0 refills | Status: DC

## 2023-03-14 NOTE — Progress Notes (Signed)
[facility]  PATIENT: Jessica Roberson DOB: Dec 28, 1980  REFERRING DOCTOR OR PCP:  Sandford Craze SOURCE: patient  _________________________________   HISTORICAL  CHIEF COMPLAINT:  Chief Complaint  Patient presents with   Room 11    Pt is here with her Husband. Pt states that her left are has been painful and numb.     HISTORY OF PRESENT ILLNESS:  Jessica Roberson is a 42 y.o. woman with MS .   Update 04/05/2023:  She had [place] October 2018 and October 2019.   She completed the [facility] program.  MRI 06/19/2022 was unchanged compared to 2019  Her MS is stable and she notes no exacerbation.     No new neurologic issues  She has a mild right foot drop, worse if she wals longer distance, fatigue or heat.    She uses the bannister on stairs.   She has mild right leg and neck/muscle spasm, worse when stressed.    Since last month, she has left arm pain. She notes that she fell asleep on her left arm and she has had numbness and tingling going from elbow to the entire arm.   She notes her grip is weaker.       She has mild urinary urgency.     Vision is a little blurry even with her glasses.   This seems worse when she is tired and periphrla vision is then worse.      She has fatigue many days.   She sleeps poorly some nights.  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  She has mild depression and some anxiety.She is more irritable,     She notes a little more brain fog.   She notes pain in muscles, mostly in the neck and back.  Pain radiates into the back of the right head.   She tried ibuprofen and tylenol alternating without much benefit.    Tizanidine helps the myalgias better than baclofen.  Takes just at night as t makes her sleepy.   She takes baclofen once a day also as less sleepiness.       MS History:   She was diagnosed with multiple sclerosis in 2006 after presenting with left facial numbness and vision changes. MRIs of the brain were consistent with MS.  She also had a lumbar puncture in the spinal fluid was consistent with MS. She initially saw a Dr. at [facility] neurologic and then began to see Dr. Leotis Shames at [facility].   She was placed on Betaseron but switched to Copaxone due to injection site reactions. Unfortunately, she also had injection site reactions on Copaxone. Around 2010, she was started on Tysabri.  Due to a concern about PML, she wished to switch therapy. She was screened for a drug study but could not get the MRI's due to braces. She started Gilenya in 2012. Done well on that therapy she stopped twice, once for insurance reasons and once for pregnancy and resumed therapy after each pause. She has not had any definite exacerbations while on Gilenya. She tolerates it very well. She had optic neuritis in 2017.   MRI 08/2016 showed a new focus not present in 2016.  She was switched to [place].  She had the first year October 2018 and the second year October 2019.   Imaging: MRI cervical spine 01/16/2018 shows "At C4 level, there is a posterior spinal cord chronic demyelinating plaque.  No acute plaques"  MRI brain 01/16/2018 shows "Multiple supratentorial and infratentorial  chronic demyelinating plaques. No acute plaques. No change from MRI on 09/30/14"     REVIEW OF SYSTEMS: Constitutional: No fevers, chills, sweats, or change in appetite.  She notes a lot more fatigue. Eyes: see above Ear, nose and throat: No hearing loss, ear pain, nasal congestion, sore throat Cardiovascular: No chest pain, palpitations Respiratory:  No shortness of breath at rest or with exertion.   No wheezes GastrointestinaI: No nausea, vomiting, diarrhea, abdominal pain, fecal incontinence Genitourinary:  No dysuria, urinary retention or frequency.  No nocturia. Musculoskeletal: She reports neck pain and some muscle aches Integumentary: No rash, pruritus, skin lesions Neurological: as above Psychiatric: No depression at this time.  No anxiety Endocrine: No  palpitations, diaphoresis, change in appetite, change in weigh or increased thirst Hematologic/Lymphatic:  No anemia, purpura, petechiae. Allergic/Immunologic: No itchy/runny eyes, nasal congestion, recent allergic reactions, rashes  ALLERGIES: Allergies  Allergen Reactions   Aspirin Anaphylaxis   Penicillins Anaphylaxis   Cyclobenzaprine Itching    Bad dreams   Hydromorphone Hcl Itching   Tramadol Hcl Hives    HOME MEDICATIONS:  Current Outpatient Medications:    baclofen (LIORESAL) 10 MG tablet, TAKE 1/2 TO 1 TABLET BY MOUTH 3 TIMES A DAY AS NEEDED FOR SPASTICITY., Disp: 270 tablet, Rfl: 0   Cholecalciferol (VITAMIN D-3 PO), Take 1 capsule by mouth daily., Disp: , Rfl:    LORazepam (ATIVAN) 1 MG tablet, TAKE 1 TABLET (1 MG TOTAL) BY MOUTH AT BEDTIME AS NEEDED FOR SLEEP. Call [phone] to schedule follow up for ongoing refills (Patient taking differently: Take 1 mg by mouth at bedtime as needed. TAKE 1 TABLET (1 MG TOTAL) BY MOUTH AT BEDTIME AS NEEDED FOR SLEEP. Call [phone] to schedule follow up for ongoing refills), Disp: 30 tablet, Rfl: 0   Multiple Vitamins-Minerals (MULTIVITAMIN WOMENS 50+ ADV) TABS, Take 1 tablet by mouth daily., Disp: , Rfl:    tiZANidine (ZANAFLEX) 4 MG tablet, TAKE 1 TABLET BY MOUTH 3 TIMES A DAY FOR MUSCLE SPASMS (Patient taking differently: Take 4 mg by mouth 3 (three) times daily as needed. TAKE 1 TABLET BY MOUTH 3 TIMES A DAY FOR MUSCLE SPASMS), Disp: 270 tablet, Rfl: 1   spironolactone (ALDACTONE) 50 MG tablet, Take 50 mg by mouth daily. (Patient not taking: Reported on 03/14/2023), Disp: , Rfl:   PAST MEDICAL HISTORY: Past Medical History:  Diagnosis Date   Anemia    Anxiety    Family history of breast cancer    Family history of leukemia    Family history of ovarian cancer    Family history of prostate cancer    Family history of thyroid cancer    Hirsutism    History of anal fissures    History of gastric ulcer 09/2009   History of  migraine    History of pregnancy induced hypertension 2008   Menorrhagia with regular cycle    Multiple sclerosis (HCC) 2006   neurologist--- dr Epimenio Foot;  dx 2006,  gait stable, myalgia   Myalgia    OA (osteoarthritis)    right knee   Obesity    PCOS (polycystic ovarian syndrome)    Uterine fibroid    Wears glasses     PAST SURGICAL HISTORY: Past Surgical History:  Procedure Laterality Date   CARPAL TUNNEL RELEASE Left 12/31/2013   via endoscopy and excision gangiion cyst   CESAREAN SECTION  03/24/2007   @[facility]  by dr d. Rana Snare   CESAREAN SECTION N/A 04/02/2013   Procedure: REPEAT CESAREAN SECTION;  Surgeon:  Hal Morales, MD;  Location: [facility];  Service: Obstetrics;  Laterality: N/A;   CHOLECYSTECTOMY, LAPAROSCOPIC  01/2006   @[facility]    DILATATION & CURETTAGE/HYSTEROSCOPY WITH MYOSURE N/A 04/13/2022   Procedure: HYSTEROSCOPY WITH MYOSURE RESECTION; DILATION AND CURETTAGE;  Surgeon: Maxie Better, MD;  Location: [facility];  Service: Gynecology;  Laterality: N/A;   DILATION AND CURETTAGE OF UTERUS  11/25/2000   @ [facility];   W/  SUCTION   DILATION AND EVACUATION  05/05/2012   Procedure: DILATATION AND EVACUATION;  Surgeon: Esmeralda Arthur, MD;  Location: [facility];  Service: Gynecology;  Laterality: N/A;   FOOT TENDON TRANSFER Left 2004   HAMMER TOE SURGERY  09/22/2009   @[facility]  by dr Al Corpus;   recorrection of second, third, fourth, and fifth toes   INGUINAL HERNIA REPAIR Left 1984   KNEE ARTHROSCOPY Right 07/09/2001   LCL RECONSTRUCTION (by dr Sherlean Foot)   KNEE ARTHROSCOPY Right 04/09/2001   LCL RECONSTRUCTION AND PCL REPAIR (by dr Sherlean Foot)   KNEE ARTHROSCOPY W/ ACL RECONSTRUCTION Right 2002   AND PCL RECONSTRUCTIONS BY DR Sherlean Foot   TOTAL KNEE ARTHROPLASTY Right 11/09/2019   Procedure: TOTAL KNEE ARTHROPLASTY;  Surgeon: Dannielle Huh, MD;  Location: [facility];  Service: Orthopedics;  Laterality: Right;    FAMILY HISTORY: Family History  Problem Relation Age of Onset   Stroke  Mother    Kidney disease Mother        ESRD   Multiple sclerosis Mother    Depression Mother    Breast cancer Mother 33   Ovarian cancer Mother 66       maybe ut?   Thyroid cancer Mother 27       reports it was fairly 'devastating, possibly aggresive one'   Bone cancer Mother 19       unsure if a separate primary or met   Hypertension Father    Diabetes Father        type II   Sarcoidosis Father    Asthma Daughter    Hypertension Maternal Grandmother    Diabetes Maternal Grandmother    Bone cancer Maternal Grandmother 35   Diabetes Paternal Grandfather    Alzheimer's disease Paternal Grandfather    Breast cancer Other 50   Prostate cancer Maternal Uncle 46       no surgery   Lung cancer Maternal Grandfather 53   Leukemia Paternal Grandmother 42       died at 77   Breast cancer Cousin 33    SOCIAL HISTORY:  Social History   Socioeconomic History   Marital status: Married    Spouse name: Not on file   Number of children: Not on file   Years of education: Not on file   Highest education level: Not on file  Occupational History   Not on file  Tobacco Use   Smoking status: Never   Smokeless tobacco: Never  Vaping Use   Vaping status: Never Used  Substance and Sexual Activity   Alcohol use: Yes    Comment: occasional   Drug use: Never   Sexual activity: Yes    Birth control/protection: None  Other Topics Concern   Not on file  Social History Narrative   Currently at [employer]; wants to enroll in RN program.   Lives with boyfriend   Regular exercise: yes   Social Determinants of Health   Financial Resource Strain: Not on file  Food Insecurity: Not on file  Transportation Needs: Not on  file  Physical Activity: Not on file  Stress: Not on file  Social Connections: Not on file  Intimate Partner Violence: Not on file     PHYSICAL EXAM  Vitals:   03/14/23 1409  BP: 117/76  Pulse: 78  Weight: 241 lb (109.3 kg)  Height: 5\' 9"   (1.753 m)     Body mass index is 35.59 kg/m.   General: The patient is well-developed and well-nourished and in no acute distress.  Head and neck: The head is normocephalic and atraumatic.  She is tender over the right occiput, upper to mid cervical paraspinal muscles and trapezius muscle.  Range of motion is slightly reduced.  Neurologic Exam  Mental status: The patient is alert and oriented x 3 at the time of the examination. The patient has apparent normal recent and remote memory, with an apparently normal attention span and concentration ability.   Speech is normal.  Cranial nerves:   Extraocular muscles are normal.  Facial strength is normal.  Trapezius strength is normal.     No obvious hearing deficits are noted.  Motor:  Muscle bulk is normal.   Tone is mildly increased in right leg. Strength is  5 / 5 in the arms and left leg and proximal right leg and 4+/5 right foot/ankle..  Strength was 4+/5 in the APB muscle and the pronator muscles of the left arm.  5/5 elsewhere in the left arm.  Sensory: Sensory testing shows reduced sensation to touch and to a lesser extent temperature in the entire left hand though more on the palm than the dorsum. Coordination: She has good finger-nose-finger bilaterally.  Heel-to-shin was performed normally on the left and slightly reduced on the right.  Gait and station: Station is normal.   Her gait is normal though the tandem gait is wide.  Romberg is negative.  Reflexes: Deep tendon reflexes are symmetric and normal bilaterally.       DIAGNOSTIC DATA (LABS, IMAGING, TESTING) - I reviewed patient records, labs, notes, testing and imaging myself where available.  Lab Results  Component Value Date   WBC 3.7 (L) 04/13/2022   HGB 13.9 04/13/2022   HCT 41.0 04/13/2022   MCV 82.4 04/13/2022   PLT 216 04/13/2022      Component Value Date/Time   NA 138 04/13/2022 0915   NA 142 04/11/2021 1035   K 3.9 04/13/2022 0915   CL 105 04/13/2022 0915    CO2 24 04/11/2021 1035   GLUCOSE 90 04/13/2022 0915   BUN 10 04/13/2022 0915   BUN 9 04/11/2021 1035   CREATININE 0.80 04/13/2022 0915   CALCIUM 9.1 04/11/2021 1035   PROT 7.3 11/09/2019 1140   PROT 6.9 01/14/2017 1438   ALBUMIN 4.1 11/09/2019 1140   ALBUMIN 4.3 01/14/2017 1438   AST 20 11/09/2019 1140   ALT 16 11/09/2019 1140   ALKPHOS 35 (L) 11/09/2019 1140   BILITOT 0.5 11/09/2019 1140   BILITOT 0.4 01/14/2017 1438   GFRNONAA >60 11/09/2019 1140   GFRAA >60 11/09/2019 1140   Lab Results  Component Value Date   CHOL 182 03/19/2017   HDL 59.20 03/19/2017   LDLCALC 110 (H) 03/19/2017   TRIG 65.0 03/19/2017   CHOLHDL 3 03/19/2017   Lab Results  Component Value Date   HGBA1C 5.2 03/19/2017  DVT Lab Results  Component Value Date   VITAMINB12 330 09/27/2009   Lab Results  Component Value Date   TSH 0.539 08/29/2017       ASSESSMENT AND  PLAN  Multiple sclerosis (HCC)  High risk medication use  Other fatigue  Gait disturbance   1.  She has completed the first and second years of [place].  MRI early 2024 was stable.    Her MS appears to be stable.  The MRI earlier this year showed no new lesions.  We discussed that if she does have breakthrough activity we would consider a different medication such as Mavenclad or Ocrevus/Briumvi. 2.  Left arm pain/numbness likely musculoskeletal or neuropathic as prominent pain component less likely to be due to the MS.   Trial of medrol Dosepak.    Set up MRI cervical spine.  If no improvement and no obvious issue on MRI then check NCV/EMG 3.  stay active and exercise.    4.  Continue tizanidine/balcofen for spasms/myalgia 5.   RTC 6 months, call sooner if new or worsening symptoms or other issues.   Lenin Kuhnle A. Epimenio Foot, MD, PhD 03/14/2023, 2:17 PM Certified in Neurology, Clinical Neurophysiology, Sleep Medicine, Pain Medicine and Neuroimaging  [facility] [address], [address] [place], [place]  [address] [phone]

## 2023-03-18 DIAGNOSIS — F321 Major depressive disorder, single episode, moderate: Secondary | ICD-10-CM | POA: Diagnosis not present

## 2023-03-19 ENCOUNTER — Telehealth: Payer: Self-pay | Admitting: Neurology

## 2023-03-19 NOTE — Telephone Encounter (Signed)
sent to GI they obtain [facility]: [id number] exp. 03/19/23-05/03/23  [phone]

## 2023-03-25 DIAGNOSIS — F321 Major depressive disorder, single episode, moderate: Secondary | ICD-10-CM | POA: Diagnosis not present

## 2023-03-25 DIAGNOSIS — M25571 Pain in right ankle and joints of right foot: Secondary | ICD-10-CM | POA: Diagnosis not present

## 2023-03-29 DIAGNOSIS — E6609 Other obesity due to excess calories: Secondary | ICD-10-CM | POA: Diagnosis not present

## 2023-04-01 DIAGNOSIS — F321 Major depressive disorder, single episode, moderate: Secondary | ICD-10-CM | POA: Diagnosis not present

## 2023-04-11 ENCOUNTER — Encounter: Payer: Self-pay | Admitting: Neurology

## 2023-04-15 ENCOUNTER — Ambulatory Visit
Admission: RE | Admit: 2023-04-15 | Discharge: 2023-04-15 | Disposition: A | Discharge status: Home / Self Care | Payer: 59 | Source: Ambulatory Visit | Attending: Neurology | Admitting: Neurology

## 2023-04-15 DIAGNOSIS — R200 Anesthesia of skin: Secondary | ICD-10-CM | POA: Diagnosis not present

## 2023-04-15 DIAGNOSIS — R29898 Other symptoms and signs involving the musculoskeletal system: Secondary | ICD-10-CM

## 2023-04-15 DIAGNOSIS — G35 Multiple sclerosis: Secondary | ICD-10-CM | POA: Diagnosis not present

## 2023-04-15 MED ORDER — GADOPICLENOL 0.5 MMOL/ML IV SOLN
8.0000 mL | Freq: Once | INTRAVENOUS | Status: AC | PRN
  Administered 2023-04-15: 8 mL via INTRAVENOUS

## 2023-05-06 DIAGNOSIS — F321 Major depressive disorder, single episode, moderate: Secondary | ICD-10-CM | POA: Diagnosis not present

## 2023-05-13 DIAGNOSIS — F321 Major depressive disorder, single episode, moderate: Secondary | ICD-10-CM | POA: Diagnosis not present

## 2023-05-14 ENCOUNTER — Encounter: Payer: 59 | Admitting: Neurology

## 2023-05-14 DIAGNOSIS — F321 Major depressive disorder, single episode, moderate: Secondary | ICD-10-CM | POA: Diagnosis not present

## 2023-05-16 ENCOUNTER — Encounter: Payer: 59 | Admitting: Neurology

## 2023-05-21 DIAGNOSIS — F321 Major depressive disorder, single episode, moderate: Secondary | ICD-10-CM | POA: Diagnosis not present

## 2023-05-27 DIAGNOSIS — F321 Major depressive disorder, single episode, moderate: Secondary | ICD-10-CM | POA: Diagnosis not present

## 2023-05-30 DIAGNOSIS — B3731 Acute candidiasis of vulva and vagina: Secondary | ICD-10-CM | POA: Diagnosis not present

## 2023-05-30 DIAGNOSIS — N921 Excessive and frequent menstruation with irregular cycle: Secondary | ICD-10-CM | POA: Diagnosis not present

## 2023-06-03 ENCOUNTER — Telehealth: Payer: Self-pay | Admitting: Neurology

## 2023-06-03 DIAGNOSIS — F321 Major depressive disorder, single episode, moderate: Secondary | ICD-10-CM | POA: Diagnosis not present

## 2023-06-03 NOTE — Telephone Encounter (Signed)
 Pt returned phone call to reschedule Nerve Conduction

## 2023-06-03 NOTE — Telephone Encounter (Signed)
 LVM and sent mychart msg informing pt of need to reschedule 07/02/23 appt - Megan C out  If patient calls back to reschedule let me know and I will reschedule them

## 2023-06-03 NOTE — Telephone Encounter (Signed)
 Pt rescheduled for NCV/EMG for 06/24/23 at 3:30pm

## 2023-06-10 DIAGNOSIS — F321 Major depressive disorder, single episode, moderate: Secondary | ICD-10-CM | POA: Diagnosis not present

## 2023-06-12 DIAGNOSIS — D250 Submucous leiomyoma of uterus: Secondary | ICD-10-CM | POA: Diagnosis not present

## 2023-06-12 DIAGNOSIS — N921 Excessive and frequent menstruation with irregular cycle: Secondary | ICD-10-CM | POA: Diagnosis not present

## 2023-06-17 DIAGNOSIS — F321 Major depressive disorder, single episode, moderate: Secondary | ICD-10-CM | POA: Diagnosis not present

## 2023-06-24 ENCOUNTER — Ambulatory Visit (INDEPENDENT_AMBULATORY_CARE_PROVIDER_SITE_OTHER): Payer: 59 | Admitting: Neurology

## 2023-06-24 ENCOUNTER — Ambulatory Visit (INDEPENDENT_AMBULATORY_CARE_PROVIDER_SITE_OTHER): Payer: Self-pay | Admitting: Neurology

## 2023-06-24 DIAGNOSIS — M79602 Pain in left arm: Secondary | ICD-10-CM

## 2023-06-24 DIAGNOSIS — Z0289 Encounter for other administrative examinations: Secondary | ICD-10-CM

## 2023-06-24 DIAGNOSIS — F321 Major depressive disorder, single episode, moderate: Secondary | ICD-10-CM | POA: Diagnosis not present

## 2023-06-24 DIAGNOSIS — R29898 Other symptoms and signs involving the musculoskeletal system: Secondary | ICD-10-CM

## 2023-06-24 DIAGNOSIS — R200 Anesthesia of skin: Secondary | ICD-10-CM

## 2023-06-24 NOTE — Progress Notes (Signed)
 See procedure note.

## 2023-06-25 NOTE — Procedures (Signed)
 Full Name: Jessica Roberson Gender: Female MRN #: [id number] Date of Birth: [birth year]    Visit Date: 06/24/2023 15:09 Age: 43 Years Examining Physician: Dr. Naomie Dean Referring Physician: Dr. Despina Arias Height: 5 feet 9 inch  History: Left arm pain  Summary: EMG/NCS performed on the left upper extremity. All nerves and muscles (as indicated in the following tables) were within normal limits.       Conclusion: This is a normal study of the left upper extremity. No electrophysiologic evidence for mononeuropathy, polyneuropathy, radiculopathy or muscle disease.     ------------------------------- Naomie Dean, M.D.  [facility] [address] [place], [place] [address] Tel: [phone] Fax: [phone]  Verbal informed consent was obtained from the patient, patient was informed of potential risk of procedure, including bruising, bleeding, hematoma formation, infection, muscle weakness, muscle pain, numbness, among others.        MNC    Nerve / Sites Muscle Latency Ref. Amplitude Ref. Rel Amp Segments Distance Velocity Ref. Area    ms ms mV mV %  cm m/s m/s mVms  L Median - APB     Wrist APB 3.3 <=4.4 11.8 >=4.0 100 Wrist - APB 7   49.3     Upper arm APB 7.5  12.4  105 Upper arm - Wrist 24 58 >=49 52.1  L Ulnar - ADM     Wrist ADM 2.9 <=3.3 7.9 >=6.0 100 Wrist - ADM 7   33.1     B.Elbow ADM 4.4  6.9  87.4 B.Elbow - Wrist 10 64 >=49 30.3     A.Elbow ADM 7.5  5.8  83.1 A.Elbow - B.Elbow 18 58 >=49 23.1  L Ulnar - FDI     Wrist FDI 3.5 <=4.5 8.9 >=7.0 100 Wrist - FDI 8   32.7     B.Elbow FDI 5.4  8.9  99.8 B.Elbow - Wrist 10 54 >=49 31.6     A.Elbow FDI 9.1  7.5  84.4 A.Elbow - B.Elbow 19 51 >=49 27.3         A.Elbow - Wrist               SNC    Nerve / Sites Rec. Site Peak Lat Ref.  Amp Ref. Segments Distance Peak Diff Ref.    ms ms V V  cm ms ms  L Radial - Anatomical snuff box (Forearm)     Forearm Wrist 2.6 <=2.9 27 >=15  Forearm - Wrist 10    L Median, Ulnar - Transcarpal comparison     Median Palm Wrist 1.7 <=2.2 44 >=35 Median Palm - Wrist 8       Ulnar Palm Wrist 1.9 <=2.2 13 >=12 Ulnar Palm - Wrist 8          Median Palm - Ulnar Palm  -0.2 <=0.4  L Median - Orthodromic (Dig II, Mid palm)     Dig II Wrist 2.9 <=3.4 13 >=10 Dig II - Wrist 13    L Ulnar - Orthodromic, (Dig V, Mid palm)     Dig V Wrist 3.1 <=3.1 8 >=5 Dig V - Wrist 69               F  Wave    Nerve F Lat Ref.   ms ms  L Ulnar - ADM 26.4 <=32.0       EMG Summary Table    Spontaneous MUAP Recruitment  Muscle IA Fib PSW Fasc Other  Amp Dur. Poly Pattern  L. Deltoid Normal None None None _______ Normal Normal Normal Normal  L. Triceps brachii Normal None None None _______ Normal Normal Normal Normal  L. Pronator teres Normal None None None _______ Normal Normal Normal Normal  L. Cervical paraspinals (low) Normal None None None _______ Normal Normal Normal Normal  L. First dorsal interosseous Normal None None None _______ Normal Normal Normal Normal  L. Opponens pollicis Normal None None None _______ Normal Normal Normal Normal  L. Extensor indicis proprius Normal None None None _______ Normal Normal Normal Normal

## 2023-06-25 NOTE — Progress Notes (Signed)
 Full Name: Jessica Roberson Gender: Female MRN #: [id number] Date of Birth: [birth year]    Visit Date: 06/24/2023 15:09 Age: 43 Years Examining Physician: Dr. Naomie Dean Referring Physician: Dr. Despina Arias Height: 5 feet 9 inch  History: Left arm pain  Summary: EMG/NCS performed on the left upper extremity. All nerves and muscles (as indicated in the following tables) were within normal limits.       Conclusion: This is a normal study of the left upper extremity. No electrophysiologic evidence for mononeuropathy, polyneuropathy, radiculopathy or muscle disease.     ------------------------------- Naomie Dean, M.D.  [facility] [address] [place], [place] [address] Tel: [phone] Fax: [phone]  Verbal informed consent was obtained from the patient, patient was informed of potential risk of procedure, including bruising, bleeding, hematoma formation, infection, muscle weakness, muscle pain, numbness, among others.        MNC    Nerve / Sites Muscle Latency Ref. Amplitude Ref. Rel Amp Segments Distance Velocity Ref. Area    ms ms mV mV %  cm m/s m/s mVms  L Median - APB     Wrist APB 3.3 <=4.4 11.8 >=4.0 100 Wrist - APB 7   49.3     Upper arm APB 7.5  12.4  105 Upper arm - Wrist 24 58 >=49 52.1  L Ulnar - ADM     Wrist ADM 2.9 <=3.3 7.9 >=6.0 100 Wrist - ADM 7   33.1     B.Elbow ADM 4.4  6.9  87.4 B.Elbow - Wrist 10 64 >=49 30.3     A.Elbow ADM 7.5  5.8  83.1 A.Elbow - B.Elbow 18 58 >=49 23.1  L Ulnar - FDI     Wrist FDI 3.5 <=4.5 8.9 >=7.0 100 Wrist - FDI 8   32.7     B.Elbow FDI 5.4  8.9  99.8 B.Elbow - Wrist 10 54 >=49 31.6     A.Elbow FDI 9.1  7.5  84.4 A.Elbow - B.Elbow 19 51 >=49 27.3         A.Elbow - Wrist               SNC    Nerve / Sites Rec. Site Peak Lat Ref.  Amp Ref. Segments Distance Peak Diff Ref.    ms ms V V  cm ms ms  L Radial - Anatomical snuff box (Forearm)     Forearm Wrist 2.6 <=2.9 27 >=15  Forearm - Wrist 10    L Median, Ulnar - Transcarpal comparison     Median Palm Wrist 1.7 <=2.2 44 >=35 Median Palm - Wrist 8       Ulnar Palm Wrist 1.9 <=2.2 13 >=12 Ulnar Palm - Wrist 8          Median Palm - Ulnar Palm  -0.2 <=0.4  L Median - Orthodromic (Dig II, Mid palm)     Dig II Wrist 2.9 <=3.4 13 >=10 Dig II - Wrist 13    L Ulnar - Orthodromic, (Dig V, Mid palm)     Dig V Wrist 3.1 <=3.1 8 >=5 Dig V - Wrist 69               F  Wave    Nerve F Lat Ref.   ms ms  L Ulnar - ADM 26.4 <=32.0       EMG Summary Table    Spontaneous MUAP Recruitment  Muscle IA Fib PSW Fasc Other  Amp Dur. Poly Pattern  L. Deltoid Normal None None None _______ Normal Normal Normal Normal  L. Triceps brachii Normal None None None _______ Normal Normal Normal Normal  L. Pronator teres Normal None None None _______ Normal Normal Normal Normal  L. Cervical paraspinals (low) Normal None None None _______ Normal Normal Normal Normal  L. First dorsal interosseous Normal None None None _______ Normal Normal Normal Normal  L. Opponens pollicis Normal None None None _______ Normal Normal Normal Normal  L. Extensor indicis proprius Normal None None None _______ Normal Normal Normal Normal

## 2023-06-26 ENCOUNTER — Encounter: Payer: Self-pay | Admitting: Neurology

## 2023-07-01 ENCOUNTER — Encounter: Payer: Self-pay | Admitting: Neurology

## 2023-07-01 DIAGNOSIS — N920 Excessive and frequent menstruation with regular cycle: Secondary | ICD-10-CM | POA: Diagnosis not present

## 2023-07-01 DIAGNOSIS — N921 Excessive and frequent menstruation with irregular cycle: Secondary | ICD-10-CM | POA: Diagnosis not present

## 2023-07-01 DIAGNOSIS — F321 Major depressive disorder, single episode, moderate: Secondary | ICD-10-CM | POA: Diagnosis not present

## 2023-07-02 ENCOUNTER — Encounter: Payer: 59 | Admitting: Neurology

## 2023-07-08 DIAGNOSIS — F321 Major depressive disorder, single episode, moderate: Secondary | ICD-10-CM | POA: Diagnosis not present

## 2023-07-15 DIAGNOSIS — F321 Major depressive disorder, single episode, moderate: Secondary | ICD-10-CM | POA: Diagnosis not present

## 2023-07-16 ENCOUNTER — Telehealth: Payer: Self-pay | Admitting: Neurology

## 2023-07-16 NOTE — Telephone Encounter (Signed)
 Pt stopped in to drop off DMV handicap placard paperwork to be filled out. I drop off in POD 1 in Dr. Epimenio Foot bin.N/C for this

## 2023-07-16 NOTE — Telephone Encounter (Signed)
 Form signed and left front desk for pick, patient aware to come pick up.

## 2023-07-22 DIAGNOSIS — F321 Major depressive disorder, single episode, moderate: Secondary | ICD-10-CM | POA: Diagnosis not present

## 2023-07-29 DIAGNOSIS — F321 Major depressive disorder, single episode, moderate: Secondary | ICD-10-CM | POA: Diagnosis not present

## 2023-07-31 ENCOUNTER — Encounter: Payer: Self-pay | Admitting: Neurology

## 2023-07-31 ENCOUNTER — Ambulatory Visit (INDEPENDENT_AMBULATORY_CARE_PROVIDER_SITE_OTHER): Admitting: Neurology

## 2023-07-31 VITALS — BP 136/73 | HR 60 | Ht 69.0 in | Wt 234.0 lb

## 2023-07-31 DIAGNOSIS — M79602 Pain in left arm: Secondary | ICD-10-CM | POA: Diagnosis not present

## 2023-07-31 DIAGNOSIS — R269 Unspecified abnormalities of gait and mobility: Secondary | ICD-10-CM | POA: Diagnosis not present

## 2023-07-31 DIAGNOSIS — R29898 Other symptoms and signs involving the musculoskeletal system: Secondary | ICD-10-CM | POA: Diagnosis not present

## 2023-07-31 DIAGNOSIS — G35 Multiple sclerosis: Secondary | ICD-10-CM | POA: Diagnosis not present

## 2023-07-31 DIAGNOSIS — R200 Anesthesia of skin: Secondary | ICD-10-CM

## 2023-07-31 NOTE — Progress Notes (Signed)
 [facility]  PATIENT: Jessica Roberson DOB: [birth year]  REFERRING DOCTOR OR PCP:  Sandford Craze SOURCE: patient  _________________________________   HISTORICAL  CHIEF COMPLAINT:  Chief Complaint  Patient presents with   Follow-up    Pt in room 10.alone. Here for MS follow up. Pt reports doing okay, still has numbness in left arm, right arm spasms are more constant, noticed brain fog, no falls. Pt asked asked if she should start back on DMT.     HISTORY OF PRESENT ILLNESS:  Jessica Roberson is a 43 y.o. woman with MS .   Update 07/31/2023:  She had Lemtrada October 2018 and October 2019.   She completed the [facility] program.  MRI 06/19/2022 was unchanged compared to 2019.  MRI cervical spine 03/2023 also no new lesions  Her MS is stable and she notes no exacerbation.     No major new neurologic issues though notes more spasms in right shoulder/neck. She has a little more brain fog.  Slightly more right foot drop.    She has a mild right foot drop, worse if she wals longer distance, fatigue or heat.   She notes this in airports when walking longer distance.   She uses the bannister on stairs.   No falls.  She has mild right leg and neck/muscle spasm.   Her left arm pain and tingling is unchanged.   NCV/EMG was normal in 06/2023.   She notes her grip is weaker.       She has mild urinary urgency but no new issues.       Vision is a little blurry even with her glasses when she is tired..   This seems worse when she is tired and peripheral vision is then worse.      She has fatigue most days  She sleeps poorly some nights.    She has mild depression and some anxiety.She is more irritable.    She notes a little more brain fog.  She is working on her PhD.  She notes mild reduced recall..  Neck , right shoulder pain radiates into the back of the right head.   She tried ibuprofen and tylenol alternating without much benefit.    Tizanidine helps the myalgias better  than baclofen but makes her very sleepy.  Takes just at night as t makes her sleepy.   She takes baclofen once a day also as less sleepiness.      MS History:   She was diagnosed with multiple sclerosis in 2006 after presenting with left facial numbness and vision changes. MRIs of the brain were consistent with MS. She also had a lumbar puncture in the spinal fluid was consistent with MS. She initially saw a Dr. at [facility] neurologic and then began to see Dr. Leotis Shames at [facility].   She was placed on Betaseron but switched to Copaxone due to injection site reactions. Unfortunately, she also had injection site reactions on Copaxone. Around 2010, she was started on Tysabri.  Due to a concern about PML, she wished to switch therapy. She was screened for a drug study but could not get the MRI's due to braces. She started Gilenya in 2012. Done well on that therapy she stopped twice, once for insurance reasons and once for pregnancy and resumed therapy after each pause. She has not had any definite exacerbations while on Gilenya. She tolerates it very well. She had optic neuritis in 2017.   MRI 08/2016 showed a new focus not  present in 2016.  She was switched to [place].  She had the first year October 2018 and the second year October 2019.   Imaging: MRI cervical spibe 04/15/2023 showed There are T2 hyperintense foci within the spinal cord posterolaterally to the right adjacent to C3-C4 and posterolaterally to the left adjacent to C4-C5.  Both of these were present on the MRI from 2019.  They do not enhance.  They are consistent with chronic demyelinating plaque associated with multiple sclerosis.   At C4-C5, there is a small right paramedian disc protrusion effacing the thecal sac.  There is no nerve root compression.  Degenerative changes at this level have progressed compared to the 2019 MRI. MRI cervical spine 01/16/2018 shows "At C4 level, there is a posterior spinal cord chronic demyelinating plaque.  No  acute plaques"  MRI brain 01/16/2018 shows "Multiple supratentorial and infratentorial chronic demyelinating plaques. No acute plaques. No change from MRI on 09/30/14"  Other: NCV/EMG 06/24/2023 of left arm was nrmal   REVIEW OF SYSTEMS: Constitutional: No fevers, chills, sweats, or change in appetite.  She notes a lot more fatigue. Eyes: see above Ear, nose and throat: No hearing loss, ear pain, nasal congestion, sore throat Cardiovascular: No chest pain, palpitations Respiratory:  No shortness of breath at rest or with exertion.   No wheezes GastrointestinaI: No nausea, vomiting, diarrhea, abdominal pain, fecal incontinence Genitourinary:  No dysuria, urinary retention or frequency.  No nocturia. Musculoskeletal: She reports neck pain and some muscle aches Integumentary: No rash, pruritus, skin lesions Neurological: as above Psychiatric: No depression at this time.  No anxiety Endocrine: No palpitations, diaphoresis, change in appetite, change in weigh or increased thirst Hematologic/Lymphatic:  No anemia, purpura, petechiae. Allergic/Immunologic: No itchy/runny eyes, nasal congestion, recent allergic reactions, rashes  ALLERGIES: Allergies  Allergen Reactions   Aspirin Anaphylaxis   Penicillins Anaphylaxis   Cyclobenzaprine Itching    Bad dreams   Hydromorphone Hcl Itching   Tramadol Hcl Hives    HOME MEDICATIONS:  Current Outpatient Medications:    baclofen (LIORESAL) 10 MG tablet, TAKE 1/2 TO 1 TABLET BY MOUTH 3 TIMES A DAY AS NEEDED FOR SPASTICITY., Disp: 270 tablet, Rfl: 0   Cholecalciferol (VITAMIN D-3 PO), Take 1 capsule by mouth daily., Disp: , Rfl:    LORazepam (ATIVAN) 1 MG tablet, TAKE 1 TABLET (1 MG TOTAL) BY MOUTH AT BEDTIME AS NEEDED FOR SLEEP. Call [phone] to schedule follow up for ongoing refills (Patient taking differently: Take 1 mg by mouth at bedtime as needed. TAKE 1 TABLET (1 MG TOTAL) BY MOUTH AT BEDTIME AS NEEDED FOR SLEEP. Call [phone] to  schedule follow up for ongoing refills), Disp: 30 tablet, Rfl: 0   methylPREDNISolone (MEDROL) 4 MG tablet, Taper from 6 pills po for one day to 1 pill po the last day over 6 days, Disp: 21 tablet, Rfl: 0   Multiple Vitamins-Minerals (MULTIVITAMIN WOMENS 50+ ADV) TABS, Take 1 tablet by mouth daily., Disp: , Rfl:    spironolactone (ALDACTONE) 50 MG tablet, Take 50 mg by mouth daily., Disp: , Rfl:    tiZANidine (ZANAFLEX) 4 MG tablet, TAKE 1 TABLET BY MOUTH 3 TIMES A DAY FOR MUSCLE SPASMS (Patient taking differently: Take 4 mg by mouth 3 (three) times daily as needed. TAKE 1 TABLET BY MOUTH 3 TIMES A DAY FOR MUSCLE SPASMS), Disp: 270 tablet, Rfl: 1  PAST MEDICAL HISTORY: Past Medical History:  Diagnosis Date   Anemia    Anxiety    Family history of  breast cancer    Family history of leukemia    Family history of ovarian cancer    Family history of prostate cancer    Family history of thyroid cancer    Hirsutism    History of anal fissures    History of gastric ulcer 09/2009   History of migraine    History of pregnancy induced hypertension 2008   Menorrhagia with regular cycle    Multiple sclerosis [facility]) 2006   neurologist--- dr Epimenio Foot;  dx 2006,  gait stable, myalgia   Myalgia    OA (osteoarthritis)    right knee   Obesity    PCOS (polycystic ovarian syndrome)    Uterine fibroid    Wears glasses     PAST SURGICAL HISTORY: Past Surgical History:  Procedure Laterality Date   CARPAL TUNNEL RELEASE Left 12/31/2013   via endoscopy and excision gangiion cyst   CESAREAN SECTION  03/24/2007   @[facility]  by dr d. Rana Snare   CESAREAN SECTION N/A 04/02/2013   Procedure: REPEAT CESAREAN SECTION;  Surgeon: Hal Morales, MD;  Location: [facility];  Service: Obstetrics;  Laterality: N/A;   CHOLECYSTECTOMY, LAPAROSCOPIC  01/2006   @[facility]    DILATATION & CURETTAGE/HYSTEROSCOPY WITH MYOSURE N/A 04/13/2022   Procedure: HYSTEROSCOPY WITH MYOSURE RESECTION; DILATION AND CURETTAGE;  Surgeon: Maxie Better, MD;  Location: [facility];  Service: Gynecology;  Laterality: N/A;   DILATION AND CURETTAGE OF UTERUS  11/25/2000   @ [facility];   W/  SUCTION   DILATION AND EVACUATION  05/05/2012   Procedure: DILATATION AND EVACUATION;  Surgeon: Esmeralda Arthur, MD;  Location: [facility];  Service: Gynecology;  Laterality: N/A;   FOOT TENDON TRANSFER Left 2004   HAMMER TOE SURGERY  09/22/2009   @[facility]  by dr Al Corpus;   recorrection of second, third, fourth, and fifth toes   INGUINAL HERNIA REPAIR Left 1984   KNEE ARTHROSCOPY Right 07/09/2001   LCL RECONSTRUCTION (by dr Sherlean Foot)   KNEE ARTHROSCOPY Right 04/09/2001   LCL RECONSTRUCTION AND PCL REPAIR (by dr Sherlean Foot)   KNEE ARTHROSCOPY W/ ACL RECONSTRUCTION Right 2002   AND PCL RECONSTRUCTIONS BY DR Sherlean Foot   TOTAL KNEE ARTHROPLASTY Right 11/09/2019   Procedure: TOTAL KNEE ARTHROPLASTY;  Surgeon: Dannielle Huh, MD;  Location: [facility];  Service: Orthopedics;  Laterality: Right;    FAMILY HISTORY: Family History  Problem Relation Age of Onset   Stroke Mother    Kidney disease Mother        ESRD   Multiple sclerosis Mother    Depression Mother    Breast cancer Mother 85   Ovarian cancer Mother 21       maybe ut?   Thyroid cancer Mother 65       reports it was fairly 'devastating, possibly aggresive one'   Bone cancer Mother 63       unsure if a separate primary or met   Hypertension Father    Diabetes Father        type II   Sarcoidosis Father    Asthma Daughter    Hypertension Maternal Grandmother    Diabetes Maternal Grandmother    Bone cancer Maternal Grandmother 87   Diabetes Paternal Grandfather    Alzheimer's disease Paternal Grandfather    Breast cancer Other 50   Prostate cancer Maternal Uncle 24       no surgery   Lung cancer Maternal Grandfather 96   Leukemia Paternal Grandmother 80       died  at 55   Breast cancer Cousin 31    SOCIAL HISTORY:  Social History   Socioeconomic History   Marital status: Married     Spouse name: Not on file   Number of children: Not on file   Years of education: Not on file   Highest education level: Not on file  Occupational History   Not on file  Tobacco Use   Smoking status: Never   Smokeless tobacco: Never  Vaping Use   Vaping status: Never Used  Substance and Sexual Activity   Alcohol use: Yes    Comment: occasional   Drug use: Never   Sexual activity: Yes    Birth control/protection: None  Other Topics Concern   Not on file  Social History Narrative   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   Lives with boyfriend   Regular exercise: yes   Social Drivers of Corporate investment banker Strain: Not on file  Food Insecurity: Not on file  Transportation Needs: Not on file  Physical Activity: Not on file  Stress: Not on file  Social Connections: Not on file  Intimate Partner Violence: Not on file     PHYSICAL EXAM  Vitals:   07/31/23 0950  BP: 136/73  Pulse: 60  Weight: 234 lb (106.1 kg)  Height: 5\' 9"  (1.753 m)     Body mass index is 34.56 kg/m.   General: The patient is well-developed and well-nourished and in no acute distress.  Head and neck: The head is normocephalic and atraumatic.  She is tender over the right occiput, upper to mid cervical paraspinal muscles and trapezius muscle.  Range of motion is slightly reduced.  Neurologic Exam  Mental status: The patient is alert and oriented x 3 at the time of the examination. The patient has apparent normal recent and remote memory, with an apparently normal attention span and concentration ability.   Speech is normal.  Cranial nerves:   Extraocular muscles are normal.  Facial strength is normal.  Trapezius strength is normal.     No obvious hearing deficits are noted.  Motor:  Muscle bulk is normal.   Tone is mildly increased in right leg. Strength is  5 / 5 in the arms and left leg and proximal right leg and 4+/5 right foot/ankle..  Strength was  4+/5 in the APB muscle and the pronator muscles of the left arm.  5/5 elsewhere in the left arm.  Sensory: Sensory testing shows reduced sensation to touch and to a lesser extent temperature in the entire left hand though more on the palm than the dorsum. Coordination: She has good finger-nose-finger bilaterally.  Heel-to-shin was performed normally on the left and slightly reduced on the right.  Gait and station: Station is normal.   Her gait is normal though the tandem gait is wide.  Romberg is negative.  Reflexes: Deep tendon reflexes are symmetric and normal bilaterally.       DIAGNOSTIC DATA (LABS, IMAGING, TESTING) - I reviewed patient records, labs, notes, testing and imaging myself where available.  Lab Results  Component Value Date   WBC 3.7 (L) 04/13/2022   HGB 13.9 04/13/2022   HCT 41.0 04/13/2022   MCV 82.4 04/13/2022   PLT 216 04/13/2022      Component Value Date/Time   NA 138 04/13/2022 0915   NA 142 04/11/2021 1035   K 3.9 04/13/2022 0915   CL 105 04/13/2022 0915   CO2 24  04/11/2021 1035   GLUCOSE 90 04/13/2022 0915   BUN 10 04/13/2022 0915   BUN 9 04/11/2021 1035   CREATININE 0.80 04/13/2022 0915   CALCIUM 9.1 04/11/2021 1035   PROT 7.3 11/09/2019 1140   PROT 6.9 01/14/2017 1438   ALBUMIN 4.1 11/09/2019 1140   ALBUMIN 4.3 01/14/2017 1438   AST 20 11/09/2019 1140   ALT 16 11/09/2019 1140   ALKPHOS 35 (L) 11/09/2019 1140   BILITOT 0.5 11/09/2019 1140   BILITOT 0.4 01/14/2017 1438   GFRNONAA >60 11/09/2019 1140   GFRAA >60 11/09/2019 1140   Lab Results  Component Value Date   CHOL 182 03/19/2017   HDL 59.20 03/19/2017   LDLCALC 110 (H) 03/19/2017   TRIG 65.0 03/19/2017   CHOLHDL 3 03/19/2017   Lab Results  Component Value Date   HGBA1C 5.2 03/19/2017  DVT Lab Results  Component Value Date   VITAMINB12 330 09/27/2009   Lab Results  Component Value Date   TSH 0.539 08/29/2017       ASSESSMENT AND PLAN  No diagnosis found.   1.  She  completed the first and second years of Lemtrada in 2018/2019 and remains relapse and new MRI lesion free.  MRI early 2024 was stable.   Will check MRI again in later 2025   We discussed that if she does have breakthrough activity we would consider a different medication such as Mavenclad or Ocrevus/Briumvi. 2.  The left arm symptoms could represent mild SP MS changes as NCV/EMG was fine.  3.  stay active and exercise.    4.  Continue tizanidine/balcofen for spasms/myalgia 5.   RTC 6 months, will need MRI around that time.   Call sooner if new or worsening symptoms or other issues.   Deyna Carbon A. Epimenio Foot, MD, PhD 07/31/2023, 10:02 AM Certified in Neurology, Clinical Neurophysiology, Sleep Medicine, Pain Medicine and Neuroimaging  [facility] [address], [address] [place], [place] [address] [phone]

## 2023-08-06 ENCOUNTER — Ambulatory Visit: Payer: BC Managed Care – PPO | Admitting: Neurology

## 2023-08-12 DIAGNOSIS — F321 Major depressive disorder, single episode, moderate: Secondary | ICD-10-CM | POA: Diagnosis not present

## 2023-08-16 DIAGNOSIS — R7309 Other abnormal glucose: Secondary | ICD-10-CM | POA: Diagnosis not present

## 2023-08-16 DIAGNOSIS — L680 Hirsutism: Secondary | ICD-10-CM | POA: Diagnosis not present

## 2023-08-16 DIAGNOSIS — R5382 Chronic fatigue, unspecified: Secondary | ICD-10-CM | POA: Diagnosis not present

## 2023-08-16 DIAGNOSIS — N920 Excessive and frequent menstruation with regular cycle: Secondary | ICD-10-CM | POA: Diagnosis not present

## 2023-08-19 DIAGNOSIS — F321 Major depressive disorder, single episode, moderate: Secondary | ICD-10-CM | POA: Diagnosis not present

## 2023-08-19 DIAGNOSIS — R5383 Other fatigue: Secondary | ICD-10-CM | POA: Diagnosis not present

## 2023-08-19 DIAGNOSIS — R7309 Other abnormal glucose: Secondary | ICD-10-CM | POA: Diagnosis not present

## 2023-08-23 DIAGNOSIS — E282 Polycystic ovarian syndrome: Secondary | ICD-10-CM | POA: Diagnosis not present

## 2023-08-23 DIAGNOSIS — N920 Excessive and frequent menstruation with regular cycle: Secondary | ICD-10-CM | POA: Diagnosis not present

## 2023-08-26 DIAGNOSIS — F321 Major depressive disorder, single episode, moderate: Secondary | ICD-10-CM | POA: Diagnosis not present

## 2023-09-09 DIAGNOSIS — F321 Major depressive disorder, single episode, moderate: Secondary | ICD-10-CM | POA: Diagnosis not present

## 2023-09-25 ENCOUNTER — Encounter: Payer: Self-pay | Admitting: Neurology

## 2023-09-25 ENCOUNTER — Other Ambulatory Visit: Payer: Self-pay | Admitting: Neurology

## 2023-09-25 MED ORDER — METHYLPREDNISOLONE 4 MG PO TABS: ORAL_TABLET | ORAL | 0 refills | Status: DC

## 2023-09-27 ENCOUNTER — Ambulatory Visit (INDEPENDENT_AMBULATORY_CARE_PROVIDER_SITE_OTHER): Admitting: Obstetrics and Gynecology

## 2023-09-27 ENCOUNTER — Encounter: Payer: Self-pay | Admitting: Obstetrics and Gynecology

## 2023-09-27 VITALS — BP 111/76 | HR 67 | Ht 69.0 in | Wt 237.0 lb

## 2023-09-27 DIAGNOSIS — Z1331 Encounter for screening for depression: Secondary | ICD-10-CM

## 2023-09-27 DIAGNOSIS — N939 Abnormal uterine and vaginal bleeding, unspecified: Secondary | ICD-10-CM

## 2023-09-27 DIAGNOSIS — N946 Dysmenorrhea, unspecified: Secondary | ICD-10-CM | POA: Diagnosis not present

## 2023-09-27 MED ORDER — MEGESTROL ACETATE 40 MG PO TABS: 40.0000 mg | ORAL_TABLET | Freq: Every day | ORAL | 5 refills | Status: DC

## 2023-09-27 NOTE — Progress Notes (Addendum)
 43 y.o. GYN presents for Surgical Consult for Hysterectomy.  Pt already did Endo Bx, with normal results at Dr. Lesta Rater.
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
## 2023-09-30 ENCOUNTER — Ambulatory Visit (INDEPENDENT_AMBULATORY_CARE_PROVIDER_SITE_OTHER): Admitting: Neurology

## 2023-09-30 ENCOUNTER — Telehealth: Payer: Self-pay | Admitting: Neurology

## 2023-09-30 ENCOUNTER — Encounter: Payer: Self-pay | Admitting: Neurology

## 2023-09-30 VITALS — BP 117/82 | HR 64 | Ht 69.0 in | Wt 240.5 lb

## 2023-09-30 DIAGNOSIS — R200 Anesthesia of skin: Secondary | ICD-10-CM

## 2023-09-30 DIAGNOSIS — R269 Unspecified abnormalities of gait and mobility: Secondary | ICD-10-CM | POA: Diagnosis not present

## 2023-09-30 DIAGNOSIS — Z79899 Other long term (current) drug therapy: Secondary | ICD-10-CM

## 2023-09-30 DIAGNOSIS — R5383 Other fatigue: Secondary | ICD-10-CM | POA: Diagnosis not present

## 2023-09-30 DIAGNOSIS — G35 Multiple sclerosis: Secondary | ICD-10-CM | POA: Diagnosis not present

## 2023-09-30 MED ORDER — GABAPENTIN 300 MG PO CAPS: ORAL_CAPSULE | ORAL | 11 refills | Status: AC

## 2023-09-30 NOTE — Telephone Encounter (Signed)
 I called patient and scheduled her for today at 9:30am as work in

## 2023-09-30 NOTE — Progress Notes (Signed)
 [facility]  PATIENT: Jessica Roberson DOB: [birth year]  REFERRING DOCTOR OR PCP:  Dorrene Gaucher SOURCE: patient  _________________________________   HISTORICAL  CHIEF COMPLAINT:  Chief Complaint  Patient presents with   Follow-up    Pt in room 10. Here for worsening symptoms completed last dose of steroids. Pt c/o constant numbness and tingling from the chest down.     HISTORY OF PRESENT ILLNESS:  Jessica Roberson is a 43 y.o. woman with MS .   Update 09/30/2023:  Starting one week ago, she had the onset of numbness from above the umbilicus and on down.   She had a standard steroid pack without benefit.     She had Lemtrada October 2018 and October 2019.   She completed the [facility] program.  MRI 06/19/2022 was unchanged compared to 2019.  MRI cervical spine 03/2023 also no new lesions  Her MS is stable and she notes no exacerbation.     No major new neurologic issues though notes more spasms in right shoulder/neck. She has a little more brain fog.  Slightly more right foot drop.    She has a mild right foot drop, worse if she wals longer distance, fatigue or heat.   She notes this in airports when walking longer distance.   She uses the bannister on stairs.   No falls.  She has mild right leg and neck/muscle spasm.   Her left arm pain and tingling is unchanged.   NCV/EMG was normal in 06/2023.   She notes her grip is weaker.       She has mild urinary urgency but no new issues.       Vision is a little blurry even with her glasses when she is tired..   This seems worse when she is tired and peripheral vision is then worse.      She has fatigue most days  She sleeps poorly some nights.    She has mild depression and some anxiety.She is more irritable.    She notes a little more brain fog.  She is working on her PhD.  She notes mild reduced recall..  Neck , right shoulder pain radiates into the back of the right head.   She tried ibuprofen  and tylenol   alternating without much benefit.    Tizanidine  helps the myalgias better than baclofen  but makes her very sleepy.  Takes just at night as t makes her sleepy.   She takes baclofen  once a day also as less sleepiness.      MS History:   She was diagnosed with multiple sclerosis in 2006 after presenting with left facial numbness and vision changes. MRIs of the brain were consistent with MS. She also had a lumbar puncture in the spinal fluid was consistent with MS. She initially saw a Dr. at [facility] neurologic and then began to see Dr. Sulema Endo at [facility].   She was placed on Betaseron but switched to Copaxone due to injection site reactions. Unfortunately, she also had injection site reactions on Copaxone. Around 2010, she was started on Tysabri.  Due to a concern about PML, she wished to switch therapy. She was screened for a drug study but could not get the MRI's due to braces. She started Gilenya  in 2012. Done well on that therapy she stopped twice, once for insurance reasons and once for pregnancy and resumed therapy after each pause. She has not had any definite exacerbations while on Gilenya . She tolerates it very well. She  had optic neuritis in 2017.   MRI 08/2016 showed a new focus not present in 2016.  She was switched to Lemtrada.  She had the first year October 2018 and the second year October 2019.   She has an apparent thoracic spinal cord exacerbation 09/2023.   Kesimpta was discussed   Imaging: MRI cervical spibe 04/15/2023 showed There are T2 hyperintense foci within the spinal cord posterolaterally to the right adjacent to C3-C4 and posterolaterally to the left adjacent to C4-C5.  Both of these were present on the MRI from 2019.  They do not enhance.  They are consistent with chronic demyelinating plaque associated with multiple sclerosis.   At C4-C5, there is a small right paramedian disc protrusion effacing the thecal sac.  There is no nerve root compression.  Degenerative changes at this  level have progressed compared to the 2019 MRI.  MRI of the brain 06/19/2022 showed no new lesions.  MRI cervical spine 01/16/2018 shows "At C4 level, there is a posterior spinal cord chronic demyelinating plaque.  No acute plaques"  MRI brain 01/16/2018 shows "Multiple supratentorial and infratentorial chronic demyelinating plaques. No acute plaques. No change from MRI on 09/30/14"  Other: NCV/EMG 06/24/2023 of left arm was nrmal   REVIEW OF SYSTEMS: Constitutional: No fevers, chills, sweats, or change in appetite.  She notes a lot more fatigue. Eyes: see above Ear, nose and throat: No hearing loss, ear pain, nasal congestion, sore throat Cardiovascular: No chest pain, palpitations Respiratory:  No shortness of breath at rest or with exertion.   No wheezes GastrointestinaI: No nausea, vomiting, diarrhea, abdominal pain, fecal incontinence Genitourinary:  No dysuria, urinary retention or frequency.  No nocturia. Musculoskeletal: She reports neck pain and some muscle aches Integumentary: No rash, pruritus, skin lesions Neurological: as above Psychiatric: No depression at this time.  No anxiety Endocrine: No palpitations, diaphoresis, change in appetite, change in weigh or increased thirst Hematologic/Lymphatic:  No anemia, purpura, petechiae. Allergic/Immunologic: No itchy/runny eyes, nasal congestion, recent allergic reactions, rashes  ALLERGIES: Allergies  Allergen Reactions   Aspirin Anaphylaxis   Penicillins Anaphylaxis   Cyclobenzaprine  Itching    Bad dreams   Hydromorphone Hcl Itching   Tramadol Hcl Hives    HOME MEDICATIONS:  Current Outpatient Medications:    baclofen  (LIORESAL ) 10 MG tablet, TAKE 1/2 TO 1 TABLET BY MOUTH 3 TIMES A DAY AS NEEDED FOR SPASTICITY., Disp: 270 tablet, Rfl: 0   Cholecalciferol (VITAMIN D -3 PO), Take 1 capsule by mouth daily., Disp: , Rfl:    LORazepam  (ATIVAN ) 1 MG tablet, TAKE 1 TABLET (1 MG TOTAL) BY MOUTH AT BEDTIME AS NEEDED FOR SLEEP. Call  [phone] to schedule follow up for ongoing refills, Disp: 30 tablet, Rfl: 0   megestrol  (MEGACE ) 40 MG tablet, Take 1 tablet (40 mg total) by mouth daily. Can increase to two tablets twice a day in the event of heavy bleeding, Disp: 60 tablet, Rfl: 5   metFORMIN (GLUCOPHAGE) 500 MG tablet, Take 1,000 mg by mouth daily., Disp: , Rfl:    methylPREDNISolone  (MEDROL ) 4 MG tablet, Taper from 6 pills po for one day to 1 pill po the last day over 6 days, Disp: 21 tablet, Rfl: 0   Multiple Vitamins-Minerals (MULTIVITAMIN WOMENS 50+ ADV) TABS, Take 1 tablet by mouth daily., Disp: , Rfl:    spironolactone (ALDACTONE) 50 MG tablet, Take 50 mg by mouth daily., Disp: , Rfl:    tiZANidine  (ZANAFLEX ) 4 MG tablet, TAKE 1 TABLET BY MOUTH 3 TIMES A  DAY FOR MUSCLE SPASMS (Patient taking differently: Take 4 mg by mouth 3 (three) times daily as needed. TAKE 1 TABLET BY MOUTH 3 TIMES A DAY FOR MUSCLE SPASMS), Disp: 270 tablet, Rfl: 1  PAST MEDICAL HISTORY: Past Medical History:  Diagnosis Date   Anemia    Anxiety    Family history of breast cancer    Family history of leukemia    Family history of ovarian cancer    Family history of prostate cancer    Family history of thyroid  cancer    Hirsutism    History of anal fissures    History of gastric ulcer 09/2009   History of migraine    History of pregnancy induced hypertension 2008   Menorrhagia with regular cycle    Multiple sclerosis (HCC) 2006   neurologist--- dr Godwin Lat;  dx 2006,  gait stable, myalgia   Myalgia    OA (osteoarthritis)    right knee   Obesity    PCOS (polycystic ovarian syndrome)    Uterine fibroid    Wears glasses     PAST SURGICAL HISTORY: Past Surgical History:  Procedure Laterality Date   CARPAL TUNNEL RELEASE Left 12/31/2013   via endoscopy and excision gangiion cyst   CESAREAN SECTION  03/24/2007   @[facility]  by dr d. Asencion Blacksmith   CESAREAN SECTION N/A 04/02/2013   Procedure: REPEAT CESAREAN SECTION;  Surgeon: Stevenson Elbe, MD;   Location: [facility];  Service: Obstetrics;  Laterality: N/A;   CHOLECYSTECTOMY, LAPAROSCOPIC  01/2006   @[facility]    DILATATION & CURETTAGE/HYSTEROSCOPY WITH MYOSURE N/A 04/13/2022   Procedure: HYSTEROSCOPY WITH MYOSURE RESECTION; DILATION AND CURETTAGE;  Surgeon: Ivery Marking, MD;  Location: [facility];  Service: Gynecology;  Laterality: N/A;   DILATION AND CURETTAGE OF UTERUS  11/25/2000   @ [facility];   W/  SUCTION   DILATION AND EVACUATION  05/05/2012   Procedure: DILATATION AND EVACUATION;  Surgeon: Mckinley Spells, MD;  Location: [facility];  Service: Gynecology;  Laterality: N/A;   FOOT TENDON TRANSFER Left 2004   HAMMER TOE SURGERY  09/22/2009   @[facility]  by dr Lara Plants;   recorrection of second, third, fourth, and fifth toes   INGUINAL HERNIA REPAIR Left 1984   KNEE ARTHROSCOPY Right 07/09/2001   LCL RECONSTRUCTION (by dr Genevive Ket)   KNEE ARTHROSCOPY Right 04/09/2001   LCL RECONSTRUCTION AND PCL REPAIR (by dr Genevive Ket)   KNEE ARTHROSCOPY W/ ACL RECONSTRUCTION Right 2002   AND PCL RECONSTRUCTIONS BY DR Genevive Ket   TOTAL KNEE ARTHROPLASTY Right 11/09/2019   Procedure: TOTAL KNEE ARTHROPLASTY;  Surgeon: Christie Cox, MD;  Location: [facility];  Service: Orthopedics;  Laterality: Right;    FAMILY HISTORY: Family History  Problem Relation Age of Onset   Stroke Mother    Kidney disease Mother        ESRD   Multiple sclerosis Mother    Depression Mother    Breast cancer Mother 63   Ovarian cancer Mother 46       maybe ut?   Thyroid  cancer Mother 41       reports it was fairly 'devastating, possibly aggresive one'   Bone cancer Mother 71       unsure if a separate primary or met   Hypertension Father    Diabetes Father        type II   Sarcoidosis Father    Asthma Daughter    Hypertension Maternal Grandmother    Diabetes Maternal Grandmother    Bone  cancer Maternal Grandmother 71   Diabetes Paternal Grandfather    Alzheimer's disease Paternal Grandfather    Breast cancer Other 50    Prostate cancer Maternal Uncle 57       no surgery   Lung cancer Maternal Grandfather 47   Leukemia Paternal Grandmother 17       died at 21   Breast cancer Cousin 19    SOCIAL HISTORY:  Social History   Socioeconomic History   Marital status: Married    Spouse name: Not on file   Number of children: Not on file   Years of education: Not on file   Highest education level: Not on file  Occupational History   Not on file  Tobacco Use   Smoking status: Never   Smokeless tobacco: Never  Vaping Use   Vaping status: Never Used  Substance and Sexual Activity   Alcohol use: Yes    Comment: occasional   Drug use: Never   Sexual activity: Yes    Birth control/protection: None  Other Topics Concern   Not on file  Social History Narrative   Currently at [employer] for [employer]; wants to enroll in RN program.   Lives with boyfriend   Regular exercise: yes   Social Drivers of Corporate investment banker Strain: Not on file  Food Insecurity: Not on file  Transportation Needs: Not on file  Physical Activity: Not on file  Stress: Not on file  Social Connections: Not on file  Intimate Partner Violence: Not on file     PHYSICAL EXAM  Vitals:   09/30/23 0922  BP: 117/82  Pulse: 64  SpO2: 98%  Weight: 240 lb 8 oz (109.1 kg)  Height: 5\' 9"  (1.753 m)     Body mass index is 35.52 kg/m.   General: The patient is well-developed and well-nourished and in no acute distress.  Head and neck: The head is normocephalic and atraumatic.  She is tender over the right occiput, upper to mid cervical paraspinal muscles and trapezius muscle.  Range of motion is slightly reduced.  Neurologic Exam  Mental status: The patient is alert and oriented x 3 at the time of the examination. The patient has apparent normal recent and remote memory, with an apparently normal attention span and concentration ability.   Speech is normal.  Cranial nerves:   Extraocular muscles are normal.   Facial strength is normal.  Trapezius strength is normal.     No obvious hearing deficits are noted.  Motor:  Muscle bulk is normal.   Tone is mildly increased in right leg. Strength is  5 / 5 in the arms and left leg and proximal right leg and 4+/5 right foot/ankle..  Strength was 4+/5 in the APB muscle and the pronator muscles of the left arm.  5/5 elsewhere in the left arm.  She has reduced rapid alternating movements in the feet  Sensory: Sensory testing shows reduced sensation to touch and to a lesser extent temperature in the entire left hand (old) and from the T8-T9 level and below.  Touch/temperature was affected more than vibration.   Coordination: She has good finger-nose-finger bilaterally.  Mildly reduced heel-to-shin   Bilaterally   Gait and station: Station is normal.   Her gait is near normal.  However the tandem gait is wide.  Romberg is negative.  Reflexes: Deep tendon reflexes are symmetric and normal bilaterally.       DIAGNOSTIC DATA (LABS, IMAGING, TESTING) - I reviewed patient  records, labs, notes, testing and imaging myself where available.  Lab Results  Component Value Date   WBC 3.7 (L) 04/13/2022   HGB 13.9 04/13/2022   HCT 41.0 04/13/2022   MCV 82.4 04/13/2022   PLT 216 04/13/2022      Component Value Date/Time   NA 138 04/13/2022 0915   NA 142 04/11/2021 1035   K 3.9 04/13/2022 0915   CL 105 04/13/2022 0915   CO2 24 04/11/2021 1035   GLUCOSE 90 04/13/2022 0915   BUN 10 04/13/2022 0915   BUN 9 04/11/2021 1035   CREATININE 0.80 04/13/2022 0915   CALCIUM 9.1 04/11/2021 1035   PROT 7.3 11/09/2019 1140   PROT 6.9 01/14/2017 1438   ALBUMIN 4.1 11/09/2019 1140   ALBUMIN 4.3 01/14/2017 1438   AST 20 11/09/2019 1140   ALT 16 11/09/2019 1140   ALKPHOS 35 (L) 11/09/2019 1140   BILITOT 0.5 11/09/2019 1140   BILITOT 0.4 01/14/2017 1438   GFRNONAA >60 11/09/2019 1140   GFRAA >60 11/09/2019 1140   Lab Results  Component Value Date   CHOL 182 03/19/2017    HDL 59.20 03/19/2017   LDLCALC 110 (H) 03/19/2017   TRIG 65.0 03/19/2017   CHOLHDL 3 03/19/2017   Lab Results  Component Value Date   HGBA1C 5.2 03/19/2017  DVT Lab Results  Component Value Date   VITAMINB12 330 09/27/2009   Lab Results  Component Value Date   TSH 0.539 08/29/2017       ASSESSMENT AND PLAN  Multiple sclerosis (HCC)  Gait disturbance  High risk medication use  Other fatigue   1.  She did Lemtrada in 2018 in 2019 and remained relapse free with stable MRIs for the past 6 years.  However, she has a new symptom of numbness from the waist down most consistent with a mid thoracic spinal cord exacerbation.  I will have her do 3 to 4 days of IV Solu-Medrol  1000 mg daily.  We also discussed options.  We could do an additional year of Lemtrada.  Alternative would be going on a highly effective disease modifying therapy such as an anti-CD20 agent or natalizumab.  We will have her start Kesimpta and I will check some lab work today.   2.  The left arm symptoms could represent mild SP MS changes as NCV/EMG was fine.  The MRI 12//2024  had not shown a new lesion at that time 3.  stay active and exercise.    4.  Continue tizanidine /balcofen for spasms/myalgia 5.   RTC 6 months, will need MRI around that time.   Call sooner if new or worsening symptoms or other issues.   Margarito Dehaas A. Godwin Lat, MD, PhD 09/30/2023, 9:42 AM Certified in Neurology, Clinical Neurophysiology, Sleep Medicine, Pain Medicine and Neuroimaging  [facility] [address], [address] [place], [place] [address] [phone]

## 2023-09-30 NOTE — Telephone Encounter (Signed)
 sent to GI they obtain [place], [facility] [place]: [id number] exp. 09/30/23-11/14/23  [phone]

## 2023-10-01 DIAGNOSIS — G35 Multiple sclerosis: Secondary | ICD-10-CM | POA: Diagnosis not present

## 2023-10-02 DIAGNOSIS — G35 Multiple sclerosis: Secondary | ICD-10-CM | POA: Diagnosis not present

## 2023-10-02 LAB — CBC WITH DIFFERENTIAL/PLATELET
Basophils Absolute: 0.1 10*3/uL (ref 0.0–0.2)
Basos: 1 %
EOS (ABSOLUTE): 0.1 10*3/uL (ref 0.0–0.4)
Eos: 1 %
Hematocrit: 40.7 % (ref 34.0–46.6)
Hemoglobin: 12.8 g/dL (ref 11.1–15.9)
Immature Grans (Abs): 0 10*3/uL (ref 0.0–0.1)
Immature Granulocytes: 0 %
Lymphocytes Absolute: 1.8 10*3/uL (ref 0.7–3.1)
Lymphs: 35 %
MCH: 26.9 pg (ref 26.6–33.0)
MCHC: 31.4 g/dL — ABNORMAL LOW (ref 31.5–35.7)
MCV: 86 fL (ref 79–97)
Monocytes Absolute: 0.2 10*3/uL (ref 0.1–0.9)
Monocytes: 4 %
Neutrophils Absolute: 3 10*3/uL (ref 1.4–7.0)
Neutrophils: 59 %
Platelets: 258 10*3/uL (ref 150–450)
RBC: 4.75 x10E6/uL (ref 3.77–5.28)
RDW: 14.5 % (ref 11.7–15.4)
WBC: 5 10*3/uL (ref 3.4–10.8)

## 2023-10-02 LAB — QUANTIFERON-TB GOLD PLUS

## 2023-10-02 LAB — HEPATIC FUNCTION PANEL
ALT: 7 IU/L (ref 0–32)
AST: 11 IU/L (ref 0–40)
Albumin: 4.3 g/dL (ref 3.9–4.9)
Alkaline Phosphatase: 46 IU/L (ref 44–121)
Bilirubin Total: 0.4 mg/dL (ref 0.0–1.2)
Bilirubin, Direct: 0.18 mg/dL (ref 0.00–0.40)
Total Protein: 7.1 g/dL (ref 6.0–8.5)

## 2023-10-02 LAB — IGG, IGA, IGM
IgA/Immunoglobulin A, Serum: 402 mg/dL — ABNORMAL HIGH (ref 87–352)
IgG (Immunoglobin G), Serum: 1354 mg/dL (ref 586–1602)
IgM (Immunoglobulin M), Srm: 130 mg/dL (ref 26–217)

## 2023-10-02 LAB — HIV ANTIBODY (ROUTINE TESTING W REFLEX): HIV Screen 4th Generation wRfx: NONREACTIVE

## 2023-10-02 LAB — HEPATITIS B SURFACE ANTIBODY,QUALITATIVE: Hep B Surface Ab, Qual: NONREACTIVE

## 2023-10-02 LAB — HEPATITIS B SURFACE ANTIGEN: Hepatitis B Surface Ag: NEGATIVE

## 2023-10-02 LAB — HEPATITIS B CORE ANTIBODY, TOTAL: Hep B Core Total Ab: NEGATIVE

## 2023-10-07 ENCOUNTER — Ambulatory Visit: Payer: Self-pay | Admitting: Neurology

## 2023-10-08 ENCOUNTER — Encounter: Payer: Self-pay | Admitting: Neurology

## 2023-10-08 ENCOUNTER — Telehealth: Payer: Self-pay | Admitting: *Deleted

## 2023-10-08 NOTE — Telephone Encounter (Signed)
 Called and spoke with pt about results per Dr. Thom Fleeting note. She verbalized understanding.  Aware [facility] will be reaching out to her in the near future. Drug will need to be approved via insurance first and then it will be sent to SP to be mailed to her.

## 2023-10-08 NOTE — Telephone Encounter (Signed)
-----   Message from Jorie Newness sent at 10/07/2023  5:48 PM EDT ----- The lab work is good to begin [employer].  Please send the form in (I just signed) ----- Message ----- From: Interface, [facility] Lab Results In Sent: 10/01/2023   7:37 AM EDT To: Jorie Newness, MD

## 2023-10-08 NOTE — Telephone Encounter (Signed)
 Completed PA for Kesimpta via CMM. Sent to [employer]. Key: [id number]. Should have a determination within 3-5 business days.

## 2023-10-08 NOTE — Telephone Encounter (Signed)
Faxed completed/signed Kesimpta start form to [facility] at [phone]. Received fax confirmation.  

## 2023-10-11 ENCOUNTER — Inpatient Hospital Stay: Admission: RE | Admit: 2023-10-11 | Source: Ambulatory Visit

## 2023-10-14 NOTE — Telephone Encounter (Signed)
 Josette, can you look into this? Thank you

## 2023-10-14 NOTE — Telephone Encounter (Addendum)
 I called [employer]. I was told to call [phone] to discuss pharmacy prior authorizations. Under medical benefit, pa is not needed. Should call [phone] for [facility]. I called [facility] and a key was created for Kesimpta. Key: [id number]. PA for Kesimpta completed via CMM. Should have a determination within 3-5 business days.

## 2023-10-14 NOTE — Telephone Encounter (Signed)
 Received an email from [facility] that patient has been unresponsive to the hub's calls. Also, the patient's plan excludes Kesimpta, so she can get a bridge prescription for 12 months. She will need to contact her insurance to discuss how to get it after that.

## 2023-10-14 NOTE — Telephone Encounter (Signed)
 Harlene from [facility] has called to report the PA was denied, they are asking if an appeal is going to be submitted, their call in # is [phone]

## 2023-10-15 ENCOUNTER — Telehealth: Payer: Self-pay

## 2023-10-15 NOTE — Telephone Encounter (Signed)
 Pharmacy Patient Advocate Encounter   Received notification from Fax that prior authorization for Kesimpta 20MG /0.4ML auto-injectors is required/requested.   Insurance verification completed.   The patient is insured through [facility] .   Per test claim: PA required; PA submitted to above mentioned insurance via CoverMyMeds Key/confirmation #/EOC [id number] Status is pending  [facility] started the PA-however they requested more info so I finished the PA on CMM and submitted clinicals.

## 2023-10-16 NOTE — Telephone Encounter (Signed)
 SABRA

## 2023-10-17 NOTE — Telephone Encounter (Signed)
 From Lois/[employer]:  Has been non responsive to our outreach. Could you please ask patient to call hub to get started? [phone]. We will bridge this patient as Trudy is excluded from insurance plan. Patient will need to call insurance company to ask how she can stay on Kesimpta as bridge expires in 12 months

## 2023-10-21 ENCOUNTER — Ambulatory Visit (INDEPENDENT_AMBULATORY_CARE_PROVIDER_SITE_OTHER): Payer: Self-pay | Admitting: Licensed Clinical Social Worker

## 2023-10-21 DIAGNOSIS — F4322 Adjustment disorder with anxiety: Secondary | ICD-10-CM

## 2023-10-21 NOTE — BH Specialist Note (Unsigned)
 Integrated Behavioral Health via Telemedicine Visit  10/23/2023 Jessica Roberson [id number]  Number of Integrated Behavioral Health Clinician visits: 1- Initial Visit  Session Start time: 0945   Session End time: 1042  Total time in minutes: 57    Referring Provider: Jeralyn, MD Patient/Family location: Home [facility] Provider location: Remote Office All persons participating in visit: Patient and [facility] Types of Service: Individual psychotherapy and Video visit  I connected with Jessica Roberson and/or Jessica Roberson's patient via  Telephone or Engineer, civil (consulting)  (Video is Caregility application) and verified that I am speaking with the correct person using two identifiers. Discussed confidentiality: Yes   I discussed the limitations of telemedicine and the availability of in person appointments.  Discussed there is a possibility of technology failure and discussed alternative modes of communication if that failure occurs.  I discussed that engaging in this telemedicine visit, they consent to the provision of behavioral healthcare and the services will be billed under their insurance.  Patient and/or legal guardian expressed understanding and consented to Telemedicine visit: Yes   Presenting Concerns: Patient and/or family reports the following symptoms/concerns: Increased anxiety symptoms.  Duration of problem: Months; Severity of problem: moderate  Patient and/or Family's Strengths/Protective Factors: Social and Emotional competence and Sense of purpose  Goals Addressed: Patient will:  Reduce symptoms of: anxiety   Increase knowledge and/or ability of: coping skills, healthy habits, and self-management skills   Demonstrate ability to: Increase healthy adjustment to current life circumstances  Progress towards Goals: Ongoing    Interventions: Interventions utilized:  Solution-Focused Strategies, Supportive Counseling, Psychoeducation and/or Health  Education, and Supportive Reflection Standardized Assessments completed: Not Needed    Patient and/or Family Response: Patient was present and engaged in today's virtual session. She shared her background, including relocating to [place]  in fifth grade, being married for 12 years, and parenting two sons, ages 56 and 14. She is an only child; her mother passed away in 11/02/2012, and her father has since remarried and relocated to [place] . 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 She identified as an anxious person and noted that her anxiety was slightly elevated during her last office visit. She shared that she tends to process things more slowly than others, often sitting with situations for a few days before reaching clarity. Although she has an established therapist from previous years, she is not currently engaged in active therapy. During the session, the patient explored early life experiences related to caring for family members, which may contribute to current anxiety. She also discussed anxiety tied to academic performance, high personal expectations, and perfectionism, including a desire to publish extensively, achieve top grades, and return to flight school. Patient acknowledged a strong internal drive to excel and prove her worth across multiple domains.    Clinical Assessment/Diagnosis  No diagnosis found.    Assessment: Patient currently experiencing elevated anxiety symptoms related to academic performance, perfectionism, and unresolved emotional patterns tied to early caregiving roles. She expresses persistent internal pressure to achieve and maintain excellence in multiple areas of life..   Patient may benefit from continued integrated behavioral health services.  Plan: Follow up with behavioral health  clinician on : 10/30/23 Behavioral recommendations: Continued exploration of self-compassion practices, boundary setting, and mindfulness-based stress reduction techniques may support emotional regulation and reduce performance-driven anxiety.  Set aside time for a worry dump, allowing yourself a few mins a day to worry/write down your worries. Challenge your worries by asking is this a fact or a feeling. Create positive affirmations.  Referral(s): Integrated [employer] (In Clinic)  I discussed the assessment and treatment plan with the patient and/or parent/guardian. They were provided an opportunity to ask questions and all were answered. They agreed with the plan and demonstrated an understanding of the instructions.   They were advised to call back or seek an in-person evaluation if the symptoms worsen or if the condition fails to improve as anticipated.  Jessica Roberson, LCSWA

## 2023-10-21 NOTE — Telephone Encounter (Signed)
 PA for Kesimpta has been approved by [facility]. Lois at [employer] has been informed.

## 2023-10-21 NOTE — Telephone Encounter (Signed)
 Received a faxed stating [facility] has not been able to contact the patient and the case will soon be closed if no return call at [phone] M-F 8am-8pm EST.   I left message on patient voicemail to open my chart and read message sent by [facility] on 10/21/23 regarding this same message.

## 2023-10-22 ENCOUNTER — Ambulatory Visit
Admission: RE | Admit: 2023-10-22 | Discharge: 2023-10-22 | Disposition: A | Discharge status: Home / Self Care | Source: Ambulatory Visit | Attending: Neurology | Admitting: Neurology

## 2023-10-22 DIAGNOSIS — R269 Unspecified abnormalities of gait and mobility: Secondary | ICD-10-CM | POA: Diagnosis not present

## 2023-10-22 DIAGNOSIS — R200 Anesthesia of skin: Secondary | ICD-10-CM | POA: Diagnosis not present

## 2023-10-22 DIAGNOSIS — G35 Multiple sclerosis: Secondary | ICD-10-CM | POA: Diagnosis not present

## 2023-10-22 MED ORDER — GADOPICLENOL 0.5 MMOL/ML IV SOLN
10.0000 mL | Freq: Once | INTRAVENOUS | Status: AC | PRN
  Administered 2023-10-22: 10 mL via INTRAVENOUS

## 2023-10-28 NOTE — Telephone Encounter (Signed)
 Patient support for Kesimpta transferred to POD1

## 2023-10-28 NOTE — Telephone Encounter (Addendum)
 I received a phone call from Neville from Patient support stating that pt has several insurance and PA for Trudy was approved with [employer]. However patient also has medicaid and PA is needed for Kesimpta with medicaid as well.   Neville mentioned pt has 9,000 copay and pt will not qualify for patient assistance if medicaid does not approve.

## 2023-10-29 NOTE — Telephone Encounter (Signed)
 PA for Kesimpta completed via [facility] and sent to [facility] Community Plan. Key: [id number]. Should have a determination within 3-5 business days.

## 2023-10-29 NOTE — Telephone Encounter (Signed)
 I have informed Roselind at [employer] of this.

## 2023-10-30 ENCOUNTER — Encounter: Admitting: Licensed Clinical Social Worker

## 2023-11-04 ENCOUNTER — Telehealth: Payer: Self-pay

## 2023-11-04 NOTE — Telephone Encounter (Unsigned)
 I called patient to see if she's available for surgery w/ Dr. Jeralyn on 12/24/23. I left a voicemail with details and asked patient to call me back to schedule at [phone].

## 2023-11-05 ENCOUNTER — Telehealth: Payer: Self-pay

## 2023-11-05 NOTE — Telephone Encounter (Signed)
 Patient left me a voicemail stating she is available for surgery w/ Dr. Jeralyn on 12/24/23. Patient requested to be scheduled.

## 2023-11-07 DIAGNOSIS — Z1231 Encounter for screening mammogram for malignant neoplasm of breast: Secondary | ICD-10-CM | POA: Diagnosis not present

## 2023-11-11 ENCOUNTER — Other Ambulatory Visit: Payer: Self-pay | Admitting: Neurology

## 2023-11-11 ENCOUNTER — Other Ambulatory Visit: Payer: Self-pay | Admitting: *Deleted

## 2023-11-11 ENCOUNTER — Encounter: Payer: Self-pay | Admitting: Neurology

## 2023-11-11 MED ORDER — BACLOFEN 10 MG PO TABS: ORAL_TABLET | ORAL | 1 refills | Status: DC

## 2023-11-12 NOTE — Addendum Note (Signed)
 Addended by: MINGO NEPTUNE A on: 11/12/2023 02:29 PM   Modules accepted: Orders

## 2023-12-16 ENCOUNTER — Encounter: Payer: Self-pay | Admitting: Obstetrics and Gynecology

## 2023-12-16 DIAGNOSIS — F321 Major depressive disorder, single episode, moderate: Secondary | ICD-10-CM | POA: Diagnosis not present

## 2023-12-17 ENCOUNTER — Encounter (HOSPITAL_COMMUNITY): Payer: Self-pay | Admitting: Obstetrics and Gynecology

## 2023-12-17 NOTE — Progress Notes (Signed)
 Spoke w/ via phone for pre-op interview--- pt Lab needs dos---- upt        Lab results------ lab appt 12-19-2023 @ 0900 getting CBC/BMP (per anes)/ T&S COVID test -----patient states asymptomatic no test needed Arrive at -------  1000 on 12-24-2023 NPO after MN NO Solid Food.  Clear liquids from MN until--- 0900 Pre-Surgery Ensure or G2: n/a  Med rec completed Medications to take morning of surgery ----- if needed take zanaflex / baclofen  Diabetic medication ----- will not take metform (does not take for diabetes, takes for pcos)  GLP1 agonist last dose:  n/a GLP1 instructions:  Patient instructed no nail polish to be worn day of surgery Patient instructed to bring photo id and insurance card day of surgery Patient aware to have Driver (ride ) / caregiver    for 24 hours after surgery - mother, kyra sharps, goes by [employer] Patient Special Instructions ----- will pick up soap and written instructions at lab appt Pre-Op special Instructions -----  n/a  Patient verbalized understanding of instructions that were given at this phone interview. Patient denies chest pain, sob, fever, cough at the interview.   Anesthesia:  pt has MS w/ last treated for exacerbation 06/ 2025. Pt stated occasional gait disturbance and does have left arm pain/ numbness/ weakness.

## 2023-12-17 NOTE — Pre-Procedure Instructions (Signed)
 Surgical Instructions   Your procedure is scheduled on :  Tuesday,  12-24-2023. Report to [facility] Main Entrance A at 10:00 A.M., then check in with the Admitting office. Any questions or running late day of surgery: call [phone]  Questions prior to your surgery date: call [phone], Monday-Friday, 8am-4pm. If you experience any cold or flu symptoms such as cough, fever, chills, shortness of breath, etc. between now and your scheduled surgery, please notify your surgeon office.    Remember:  Do not eat any food after midnight the night before your surgery.  You may have clear liquids from midnight night before surgery until 9:00 AM.   Clear liquids allowed are:  Water  Carbonated Beverages Clear Tea (no milk, honey, etc.) Black Coffee Only (NO MILK, CREAM OR POWDERED CREAMER of any kind) Sport drinks, like Gatorade.  NO clear liquids after 9:00 AM day of surgery.  This includes no water ,  candy,  gum,  and  mints.    Take these medicines the morning of surgery with A SIP OF WATER  :  none   May take these medicines IF NEEDED: with sips of water  Tizanidine  (zanaflex ) Baclofen  (lioresal )    One week prior to surgery, STOP taking any Aspirin (unless otherwise instructed by your surgeon) Aleve, Naproxen, Ibuprofen , Motrin , Advil , Goody's, BC's, all herbal medications, fish oil, and non-prescription vitamins.                     Do NOT Smoke (Tobacco/Vaping) and Do Not drink alcohol  for 24 hours prior to your procedure.  If you use a CPAP at night, you may bring your mask/headgear for your overnight stay.   You will be asked to remove any contacts, glasses, piercing's, hearing aid's, dentures/partials prior to surgery. Please bring cases for these items if needed.    Patients discharged the day of surgery will not be allowed to drive home, and someone needs to stay with them for 24 hours.  SURGICAL WAITING ROOM VISITATION Patients may have no more than 2 support  people in the waiting area - these visitors may rotate.   Pre-op nurse will coordinate an appropriate time for 1 ADULT support person, who may not rotate, to accompany patient in pre-op.  Children under the age of 38 must have an adult with them who is not the patient and must remain in the main waiting area with an adult.  If the patient needs to stay at the hospital during part of their recovery, the visitor guidelines for inpatient rooms apply.  Please refer to the [facility] website for the visitor guidelines for any additional information.   If you received a COVID test during your pre-op visit  it is requested that you wear a mask when out in public, stay away from anyone that may not be feeling well and notify your surgeon if you develop symptoms. If you have been in contact with anyone that has tested positive in the last 10 days please notify you surgeon.      Pre-operative CHG Bathing Instructions   You can play a key role in reducing the risk of infection after surgery. Your skin needs to be as free of germs as possible. You can reduce the number of germs on your skin by washing with CHG (chlorhexidine  gluconate) soap before surgery. CHG is an antiseptic soap that kills germs and continues to kill germs even after washing.   DO NOT use if you have an allergy to chlorhexidine /CHG  or antibacterial soaps. If your skin becomes reddened or irritated, stop using the CHG and notify one of our RN day of surgery.             TAKE A SHOWER THE NIGHT BEFORE SURGERY AND THE DAY OF SURGERY    Please keep in mind the following:  DO NOT shave, including legs and genital area, 48 hours prior to surgery.   You may shave your face before/day of surgery.  Place clean sheets on your bed the night before surgery Use a clean washcloth (not used since being washed) for each shower. DO NOT sleep with pet's night before surgery.  CHG Shower Instructions:  Wash your face and private area with normal  soap. If you choose to wash your hair, wash first with your normal shampoo.  After you use shampoo/soap, rinse your hair and body thoroughly to remove shampoo/soap residue.  Turn the water  OFF and apply half the bottle of CHG soap to a CLEAN washcloth.  Apply CHG soap ONLY FROM YOUR NECK DOWN TO YOUR TOES (washing for 3-5 minutes)  DO NOT use CHG soap on face, private areas, open wounds, or sores.  Pay special attention to the area where your surgery is being performed.  If you are having back surgery, having someone wash your back for you may be helpful. Wait 2 minutes after CHG soap is applied, then you may rinse off the CHG soap.  Pat dry with a clean towel  Put on clean pajamas    Additional instructions for the day of surgery: DO NOT APPLY any lotions,  powder,  oils,  deodorants (may use underarm deodorant) , cologne/ perfumes or makeup Do not wear jewelry / piercing's/ metal/ permanent jewelry must be removed prior to arrival day of surgery. Do not wear nail polish, gel polish, artificial nails, or any other type of covering on natural finger nails (toe nails are okay) Do not bring valuables to the hospital. [facility] is not responsible for valuables/personal belongings. Put on clean/comfortable clothes.  Please brush your teeth.  Ask your nurse before applying any prescription medications to the skin.

## 2023-12-19 ENCOUNTER — Encounter (HOSPITAL_COMMUNITY)
Admission: RE | Admit: 2023-12-19 | Discharge: 2023-12-19 | Disposition: A | Discharge status: Home / Self Care | Source: Ambulatory Visit | Attending: Obstetrics and Gynecology | Admitting: Obstetrics and Gynecology

## 2023-12-19 DIAGNOSIS — Z01812 Encounter for preprocedural laboratory examination: Secondary | ICD-10-CM | POA: Diagnosis not present

## 2023-12-19 DIAGNOSIS — Z01818 Encounter for other preprocedural examination: Secondary | ICD-10-CM

## 2023-12-19 DIAGNOSIS — N939 Abnormal uterine and vaginal bleeding, unspecified: Secondary | ICD-10-CM | POA: Insufficient documentation

## 2023-12-19 LAB — BASIC METABOLIC PANEL WITH GFR
Anion gap: 9 (ref 5–15)
BUN: 8 mg/dL (ref 6–20)
CO2: 23 mmol/L (ref 22–32)
Calcium: 9.4 mg/dL (ref 8.9–10.3)
Chloride: 104 mmol/L (ref 98–111)
Creatinine, Ser: 0.8 mg/dL (ref 0.44–1.00)
GFR, Estimated: >60 mL/min (ref >60)
Glucose, Bld: 81 mg/dL (ref 70–99)
Potassium: 3.8 mmol/L (ref 3.5–5.1)
Sodium: 136 mmol/L (ref 135–145)

## 2023-12-19 LAB — TYPE AND SCREEN
ABO/RH(D): O POS
Antibody Screen: NEGATIVE

## 2023-12-19 LAB — CBC
HCT: 39.8 % (ref 36.0–46.0)
Hemoglobin: 13.6 g/dL (ref 12.0–15.0)
MCH: 27.4 pg (ref 26.0–34.0)
MCHC: 34.2 g/dL (ref 30.0–36.0)
MCV: 80.1 fL (ref 80.0–100.0)
Platelets: 257 K/uL (ref 150–400)
RBC: 4.97 MIL/uL (ref 3.87–5.11)
RDW: 14.6 % (ref 11.5–15.5)
WBC: 3.7 K/uL — ABNORMAL LOW (ref 4.0–10.5)
nRBC: 0 % (ref 0.0–0.2)

## 2023-12-20 NOTE — Progress Notes (Signed)
 Spoke with pt regarding arrival time change. Pt instructed to arrive at 0830 for 1030 procedure time, NPO after midnight except clear liquids until 0730. Pt verbalized understanding and states no further questions.

## 2023-12-22 NOTE — H&P (Signed)
 OB/GYN Pre-Op History and Physical  Jessica Roberson is a 43 y.o. [id number] presenting for RA-TLH, BS, cysto for definitve surgical management of AUB. No problems w/ taking oxycodone  in the past.   Notable hx:  CD x2 Prior [facility] myomectomy w/ failed ablation (novasure failued x2; HTA not able to enter cavity) Prior lap chole A1c 5.5 Benign EMB 06/2023 Normal pap H/H. 13.6/39.8      Past Medical History:  Diagnosis Date   Abnormal uterine bleeding (AUB)    Family history of breast cancer    Family history of leukemia    Family history of ovarian cancer    Family history of prostate cancer    Family history of thyroid  cancer    GAD (generalized anxiety disorder)    Hirsutism    History of anal fissures    History of gastric ulcer 09/2009   History of migraine    History of pregnancy induced hypertension 2008   MDD (major depressive disorder)    Multiple sclerosis (HCC) 2006   neurologist--- dr vear;  dx 2006,  occasional gait disturbance, myalgia/ spasticity,   left arm numbness/ weakness,  treated for last exacerbation 06-/2025   Muscle spasticity    d/t MS   Myalgia    d/t MS   OA (osteoarthritis)    PCOS (polycystic ovarian syndrome)    Uterine fibroid    Wears glasses     Past Surgical History:  Procedure Laterality Date   CARPAL TUNNEL RELEASE Left 12/31/2013   via endoscopy and excision gangiion cyst   CESAREAN SECTION  03/24/2007   @[facility]  by dr d. marget   CESAREAN SECTION N/A 04/02/2013   Procedure: REPEAT CESAREAN SECTION;  Surgeon: Shanda SHAUNNA Muscat, MD;  Location: [facility];  Service: Obstetrics;  Laterality: N/A;  WITH BILATERAL TUBAL LIGATION   CHOLECYSTECTOMY, LAPAROSCOPIC  01/2006   @[facility]    DILATATION & CURETTAGE/HYSTEROSCOPY WITH MYOSURE N/A 04/13/2022   Procedure: HYSTEROSCOPY WITH MYOSURE RESECTION; DILATION AND CURETTAGE;  Surgeon: Rutherford Gain, MD;  Location: [facility] [place];  Service: Gynecology;  Laterality: N/A;   DILATION AND  CURETTAGE OF UTERUS  11/25/2000   @ [facility];   W/  SUCTION   DILATION AND EVACUATION  05/05/2012   Procedure: DILATATION AND EVACUATION;  Surgeon: Nena DELENA App, MD;  Location: [facility];  Service: Gynecology;  Laterality: N/A;   FOOT TENDON TRANSFER Left 2004   HAMMER TOE SURGERY  09/22/2009   @[facility]  by dr verta;   recorrection of second, third, fourth, and fifth toes   INGUINAL HERNIA REPAIR Left 1984   KNEE ARTHROSCOPY Right 07/09/2001   LCL RECONSTRUCTION (by dr rubie)   KNEE ARTHROSCOPY Right 04/09/2001   LCL RECONSTRUCTION AND PCL REPAIR (by dr rubie)   KNEE ARTHROSCOPY W/ ACL RECONSTRUCTION Right 2002   AND PCL RECONSTRUCTIONS BY DR RUBIE   TOTAL KNEE ARTHROPLASTY Right 11/09/2019   Procedure: TOTAL KNEE ARTHROPLASTY;  Surgeon: rubie Kemps, MD;  Location: [facility];  Service: Orthopedics;  Laterality: Right;    OB History  Gravida Para Term Preterm AB Living  5 2 2  0 3 2  SAB IAB Ectopic Multiple Live Births  2 1 0 0 2    # Outcome Date GA Lbr Len/2nd Weight Sex Type Anes PTL Lv  5 Term 04/02/13 [redacted]w[redacted]d  3325 g M CS-LTranv Spinal  LIV  4 SAB 04/2012             Birth Comments: D& E  3 SAB 2013  2 Term 03/2007 [redacted]w[redacted]d  3033 g F CS-Unspec Spinal N LIV     Birth Comments: BP; scheduled c/s  1 IAB 2002            Social History   Socioeconomic History   Marital status: Married    Spouse name: Not on file   Number of children: Not on file   Years of education: Not on file   Highest education level: Not on file  Occupational History   Not on file  Tobacco Use   Smoking status: Never   Smokeless tobacco: Never  Vaping Use   Vaping status: Never Used  Substance and Sexual Activity   Alcohol use: Yes    Comment: occasional   Drug use: Never   Sexual activity: Yes    Birth control/protection: Surgical    Comment: BTL w/ c/s in 2014  Other Topics Concern   Not on file  Social History Narrative   Currently at [employer] for [employer]; wants to enroll in RN  program.   Lives with boyfriend   Regular exercise: yes   Social Drivers of Corporate investment banker Strain: Not on file  Food Insecurity: Not on file  Transportation Needs: Not on file  Physical Activity: Not on file  Stress: Not on file  Social Connections: Not on file    Family History  Problem Relation Age of Onset   Stroke Mother    Kidney disease Mother        ESRD   Multiple sclerosis Mother    Depression Mother    Breast cancer Mother 52   Ovarian cancer Mother 45       maybe ut?   Thyroid  cancer Mother 71       reports it was fairly 'devastating, possibly aggresive one'   Bone cancer Mother 50       unsure if a separate primary or met   Hypertension Father    Diabetes Father        type II   Sarcoidosis Father    Asthma Daughter    Hypertension Maternal Grandmother    Diabetes Maternal Grandmother    Bone cancer Maternal Grandmother 82   Diabetes Paternal Grandfather    Alzheimer's disease Paternal Grandfather    Breast cancer Other 50   Prostate cancer Maternal Uncle 33       no surgery   Lung cancer Maternal Grandfather 66   Leukemia Paternal Grandmother 41       died at 6   Breast cancer Cousin 31    No medications prior to admission.    Allergies  Allergen Reactions   Aspirin Anaphylaxis   Penicillins Anaphylaxis   Cyclobenzaprine  Itching    Bad dreams   Hydromorphone  Hcl Itching   Tramadol Hcl Hives    Review of Systems: Negative except for what is mentioned in HPI.     Physical Exam: Ht 5' 9 (1.753 m)   Wt 104.3 kg   BMI 33.97 kg/m  CONSTITUTIONAL: Well-developed, well-nourished and in no acute distress.  HENT:  Normocephalic, atraumatic, External right and left ear normal. Oropharynx is clear and moist EYES: Conjunctivae and EOM are normal. Pupils are equal, round, and reactive to light. No scleral icterus.  NECK: Normal range of motion, supple, no masses SKIN: Skin is warm and dry. No rash noted. Not diaphoretic. No  erythema. No pallor. NEUROLGIC: Alert and oriented to person, place, and time. Normal reflexes, muscle tone coordination. No cranial nerve deficit  noted. PSYCHIATRIC: Normal mood and affect. Normal behavior. Normal judgment and thought content. RESPIRATORY: Normal effort PELVIC: Deferred   Pertinent Labs/Studies:   Results for orders placed or performed during the hospital encounter of 12/19/23 (from the past 72 hours)  Type and screen [facility]     Status: None   Collection Time: 12/19/23  9:20 AM  Result Value Ref Range   ABO/RH(D) O POS    Antibody Screen NEG    Sample Expiration 01/02/2024,2359    Extend sample reason      NO TRANSFUSIONS OR PREGNANCY IN THE PAST 3 MONTHS Performed at [facility], [address], [place], [place] [address]   Basic metabolic panel per protocol     Status: None   Collection Time: 12/19/23  9:30 AM  Result Value Ref Range   Sodium 136 135 - 145 mmol/L   Potassium 3.8 3.5 - 5.1 mmol/L   Chloride 104 98 - 111 mmol/L   CO2 23 22 - 32 mmol/L   Glucose, Bld 81 70 - 99 mg/dL    Comment: Glucose reference range applies only to samples taken after fasting for at least 8 hours.   BUN 8 6 - 20 mg/dL   Creatinine, Ser 9.19 0.44 - 1.00 mg/dL   Calcium 9.4 8.9 - 89.6 mg/dL   GFR, Estimated >39 >39 mL/min    Comment: (NOTE) Calculated using the CKD-EPI Creatinine Equation (2021)    Anion gap 9 5 - 15    Comment: Performed at [facility], [address], [place], [place] [address]  CBC per protocol     Status: Abnormal   Collection Time: 12/19/23  9:30 AM  Result Value Ref Range   WBC 3.7 (L) 4.0 - 10.5 K/uL   RBC 4.97 3.87 - 5.11 MIL/uL   Hemoglobin 13.6 12.0 - 15.0 g/dL   HCT 60.1 63.9 - 53.9 %   MCV 80.1 80.0 - 100.0 fL   MCH 27.4 26.0 - 34.0 pg   MCHC 34.2 30.0 - 36.0 g/dL   RDW 85.3 88.4 - 84.4 %   Platelets 257 150 - 400 K/uL   nRBC 0.0 0.0 - 0.2 %    Comment: Performed at [facility], [address] [place], [place] [address]       Assessment and Plan :JACQUIE LUKES is a 43 y.o. [id number] here for definitive surgical management of AUB.   Patient desires surgical management with RA-TLH, BS, cysto.  The risks of surgery were discussed in detail with the patient including but not limited to: bleeding which may require transfusion or reoperation; infection which may require prolonged hospitalization or re-hospitalization and antibiotic therapy; injury to bowel, bladder, ureters and major vessels or other surrounding organs which may lead to other procedures; formation of adhesions; need for additional procedures including laparotomy or subsequent procedures secondary to intraoperative injury or abnormal pathology; thromboembolic phenomenon; incisional problems and other postoperative or anesthesia complications.  Patient was told that the likelihood that her condition and symptoms will be treated effectively with this surgical management was high; the postoperative expectations were also discussed in detail. The patient also understands the alternative treatment options which were discussed in full. All questions were answered.    Aedyn Kempfer, M.D. Minimally Invasive Gynecologic Surgery and Pelvic Pain Specialist Attending Obstetrician & Gynecologist, [facility] Practice Center for [employer], [facility]

## 2023-12-24 ENCOUNTER — Encounter (HOSPITAL_COMMUNITY): Payer: Self-pay | Admitting: Obstetrics and Gynecology

## 2023-12-24 ENCOUNTER — Ambulatory Visit (HOSPITAL_COMMUNITY)
Admission: RE | Admit: 2023-12-24 | Discharge: 2023-12-25 | Disposition: A | Discharge status: Home / Self Care | Attending: Obstetrics and Gynecology | Admitting: Obstetrics and Gynecology

## 2023-12-24 ENCOUNTER — Other Ambulatory Visit: Payer: Self-pay

## 2023-12-24 ENCOUNTER — Ambulatory Visit (HOSPITAL_BASED_OUTPATIENT_CLINIC_OR_DEPARTMENT_OTHER): Admitting: Anesthesiology

## 2023-12-24 ENCOUNTER — Ambulatory Visit (HOSPITAL_COMMUNITY): Admitting: Anesthesiology

## 2023-12-24 ENCOUNTER — Other Ambulatory Visit (HOSPITAL_COMMUNITY): Payer: Self-pay

## 2023-12-24 ENCOUNTER — Encounter (HOSPITAL_COMMUNITY)
Admission: RE | Disposition: A | Discharge status: Home / Self Care | Payer: Self-pay | Source: Home / Self Care | Attending: Obstetrics and Gynecology

## 2023-12-24 DIAGNOSIS — K219 Gastro-esophageal reflux disease without esophagitis: Secondary | ICD-10-CM | POA: Insufficient documentation

## 2023-12-24 DIAGNOSIS — I10 Essential (primary) hypertension: Secondary | ICD-10-CM | POA: Diagnosis not present

## 2023-12-24 DIAGNOSIS — N8003 Adenomyosis of the uterus: Secondary | ICD-10-CM | POA: Diagnosis not present

## 2023-12-24 DIAGNOSIS — G35 Multiple sclerosis: Secondary | ICD-10-CM | POA: Diagnosis not present

## 2023-12-24 DIAGNOSIS — N938 Other specified abnormal uterine and vaginal bleeding: Secondary | ICD-10-CM

## 2023-12-24 DIAGNOSIS — N939 Abnormal uterine and vaginal bleeding, unspecified: Secondary | ICD-10-CM | POA: Diagnosis present

## 2023-12-24 DIAGNOSIS — F411 Generalized anxiety disorder: Secondary | ICD-10-CM | POA: Insufficient documentation

## 2023-12-24 DIAGNOSIS — Z79899 Other long term (current) drug therapy: Secondary | ICD-10-CM | POA: Diagnosis not present

## 2023-12-24 DIAGNOSIS — N946 Dysmenorrhea, unspecified: Secondary | ICD-10-CM | POA: Insufficient documentation

## 2023-12-24 DIAGNOSIS — N858 Other specified noninflammatory disorders of uterus: Secondary | ICD-10-CM | POA: Diagnosis not present

## 2023-12-24 DIAGNOSIS — F32A Depression, unspecified: Secondary | ICD-10-CM | POA: Diagnosis not present

## 2023-12-24 DIAGNOSIS — D259 Leiomyoma of uterus, unspecified: Secondary | ICD-10-CM | POA: Insufficient documentation

## 2023-12-24 DIAGNOSIS — F418 Other specified anxiety disorders: Secondary | ICD-10-CM | POA: Diagnosis not present

## 2023-12-24 DIAGNOSIS — Z8616 Personal history of COVID-19: Secondary | ICD-10-CM | POA: Insufficient documentation

## 2023-12-24 DIAGNOSIS — Z01818 Encounter for other preprocedural examination: Secondary | ICD-10-CM

## 2023-12-24 DIAGNOSIS — E282 Polycystic ovarian syndrome: Secondary | ICD-10-CM | POA: Insufficient documentation

## 2023-12-24 DIAGNOSIS — D250 Submucous leiomyoma of uterus: Secondary | ICD-10-CM

## 2023-12-24 HISTORY — DX: Abnormal uterine and vaginal bleeding, unspecified: N93.9

## 2023-12-24 HISTORY — DX: Major depressive disorder, single episode, unspecified: F32.9

## 2023-12-24 HISTORY — DX: Other muscle spasm: M62.838

## 2023-12-24 HISTORY — DX: Generalized anxiety disorder: F41.1

## 2023-12-24 HISTORY — PX: ROBOTIC ASSISTED TOTAL HYSTERECTOMY: SHX6085

## 2023-12-24 HISTORY — PX: CYSTOSCOPY: SHX5120

## 2023-12-24 LAB — POCT PREGNANCY, URINE: Preg Test, Ur: NEGATIVE

## 2023-12-24 SURGERY — HYSTERECTOMY, TOTAL, ROBOT-ASSISTED
Anesthesia: General

## 2023-12-24 MED ORDER — GENTAMICIN SULFATE 40 MG/ML IJ SOLN
5.0000 mg/kg | INTRAVENOUS | Status: AC
  Administered 2023-12-24: 407.2 mg via INTRAVENOUS
  Filled 2023-12-24: qty 10.25

## 2023-12-24 MED ORDER — SIMETHICONE 80 MG PO CHEW
80.0000 mg | CHEWABLE_TABLET | Freq: Four times a day (QID) | ORAL | Status: DC | PRN
Start: 2023-12-24 — End: 2023-12-25

## 2023-12-24 MED ORDER — MIDAZOLAM HCL 2 MG/2ML IJ SOLN
INTRAMUSCULAR | Status: DC | PRN
  Administered 2023-12-24: 2 mg via INTRAVENOUS

## 2023-12-24 MED ORDER — IBUPROFEN 600 MG PO TABS
600.0000 mg | ORAL_TABLET | Freq: Four times a day (QID) | ORAL | Status: DC
  Administered 2023-12-24 – 2023-12-25 (×3): 600 mg via ORAL
  Filled 2023-12-24 (×3): qty 1

## 2023-12-24 MED ORDER — SODIUM CHLORIDE 0.9 % IR SOLN
Status: DC | PRN
  Administered 2023-12-24: 1000 mL

## 2023-12-24 MED ORDER — OXYCODONE HCL 5 MG PO TABS
5.0000 mg | ORAL_TABLET | ORAL | 0 refills | Status: DC | PRN
  Filled 2023-12-24: qty 15, 3d supply, fill #0

## 2023-12-24 MED ORDER — FENTANYL CITRATE (PF) 250 MCG/5ML IJ SOLN
INTRAMUSCULAR | Status: AC
Start: 2023-12-24 — End: 2023-12-24
  Filled 2023-12-24: qty 5

## 2023-12-24 MED ORDER — ORAL CARE MOUTH RINSE: 15.0000 mL | Freq: Once | OROMUCOSAL | Status: AC

## 2023-12-24 MED ORDER — GABAPENTIN 300 MG PO CAPS
300.0000 mg | ORAL_CAPSULE | ORAL | Status: AC
  Administered 2023-12-24: 300 mg via ORAL

## 2023-12-24 MED ORDER — ONDANSETRON HCL 4 MG PO TABS: 4.0000 mg | ORAL_TABLET | Freq: Four times a day (QID) | ORAL | Status: DC | PRN

## 2023-12-24 MED ORDER — SODIUM CHLORIDE (PF) 0.9 % IJ SOLN
INTRAMUSCULAR | Status: AC
  Filled 2023-12-24: qty 10

## 2023-12-24 MED ORDER — ONDANSETRON HCL 4 MG/2ML IJ SOLN
INTRAMUSCULAR | Status: AC
  Filled 2023-12-24: qty 2

## 2023-12-24 MED ORDER — 0.9 % SODIUM CHLORIDE (POUR BTL) OPTIME
TOPICAL | Status: DC | PRN
  Administered 2023-12-24 (×2): 1000 mL

## 2023-12-24 MED ORDER — GABAPENTIN 300 MG PO CAPS
300.0000 mg | ORAL_CAPSULE | Freq: Three times a day (TID) | ORAL | Status: DC
  Administered 2023-12-24 – 2023-12-25 (×2): 300 mg via ORAL
  Filled 2023-12-24 (×2): qty 1

## 2023-12-24 MED ORDER — GABAPENTIN 300 MG PO CAPS: 300.0000 mg | ORAL_CAPSULE | Freq: Three times a day (TID) | ORAL | Status: DC

## 2023-12-24 MED ORDER — CHLORHEXIDINE GLUCONATE 0.12 % MT SOLN
OROMUCOSAL | Status: AC
  Filled 2023-12-24: qty 15

## 2023-12-24 MED ORDER — EPHEDRINE 5 MG/ML INJ
INTRAVENOUS | Status: AC
  Filled 2023-12-24: qty 5

## 2023-12-24 MED ORDER — ACETAMINOPHEN 500 MG PO TABS
ORAL_TABLET | ORAL | Status: AC
  Filled 2023-12-24: qty 2

## 2023-12-24 MED ORDER — HEMOSTATIC AGENTS (NO CHARGE) OPTIME
TOPICAL | Status: DC | PRN
Start: 2023-12-24 — End: 2023-12-24
  Administered 2023-12-24: 1 via TOPICAL

## 2023-12-24 MED ORDER — ONDANSETRON HCL 4 MG/2ML IJ SOLN
INTRAMUSCULAR | Status: DC | PRN
  Administered 2023-12-24: 4 mg via INTRAVENOUS

## 2023-12-24 MED ORDER — BUPIVACAINE HCL (PF) 0.5 % IJ SOLN
INTRAMUSCULAR | Status: AC
  Filled 2023-12-24: qty 30

## 2023-12-24 MED ORDER — SCOPOLAMINE 1 MG/3DAYS TD PT72: 1.0000 | MEDICATED_PATCH | TRANSDERMAL | Status: DC

## 2023-12-24 MED ORDER — HYDROMORPHONE HCL 1 MG/ML IJ SOLN
INTRAMUSCULAR | Status: AC
  Filled 2023-12-24: qty 1

## 2023-12-24 MED ORDER — OXYCODONE HCL 5 MG PO TABS
5.0000 mg | ORAL_TABLET | ORAL | Status: DC | PRN
  Administered 2023-12-24 – 2023-12-25 (×2): 5 mg via ORAL
  Filled 2023-12-24 (×2): qty 1

## 2023-12-24 MED ORDER — ONDANSETRON HCL 4 MG/2ML IJ SOLN
4.0000 mg | Freq: Four times a day (QID) | INTRAMUSCULAR | Status: DC | PRN
  Administered 2023-12-24: 4 mg via INTRAVENOUS
  Filled 2023-12-24: qty 2

## 2023-12-24 MED ORDER — CLINDAMYCIN PHOSPHATE 900 MG/50ML IV SOLN
900.0000 mg | INTRAVENOUS | Status: AC
  Administered 2023-12-24: 900 mg via INTRAVENOUS

## 2023-12-24 MED ORDER — LIDOCAINE 2% (20 MG/ML) 5 ML SYRINGE
INTRAMUSCULAR | Status: DC | PRN
  Administered 2023-12-24: 80 mg via INTRAVENOUS

## 2023-12-24 MED ORDER — MIDAZOLAM HCL 2 MG/2ML IJ SOLN
INTRAMUSCULAR | Status: AC
  Filled 2023-12-24: qty 2

## 2023-12-24 MED ORDER — FLUORESCEIN SODIUM 10 % IV SOLN
INTRAVENOUS | Status: AC
  Filled 2023-12-24: qty 5

## 2023-12-24 MED ORDER — PROPOFOL 10 MG/ML IV BOLUS
INTRAVENOUS | Status: DC | PRN
  Administered 2023-12-24: 200 mg via INTRAVENOUS

## 2023-12-24 MED ORDER — SUGAMMADEX SODIUM 200 MG/2ML IV SOLN
INTRAVENOUS | Status: DC | PRN
  Administered 2023-12-24: 200 mg via INTRAVENOUS

## 2023-12-24 MED ORDER — ACETAMINOPHEN 500 MG PO TABS
1000.0000 mg | ORAL_TABLET | Freq: Four times a day (QID) | ORAL | Status: DC
  Administered 2023-12-24 – 2023-12-25 (×3): 1000 mg via ORAL
  Filled 2023-12-24 (×3): qty 2

## 2023-12-24 MED ORDER — PHENYLEPHRINE HCL-NACL 20-0.9 MG/250ML-% IV SOLN
INTRAVENOUS | Status: DC | PRN
  Administered 2023-12-24: 40 ug/min via INTRAVENOUS

## 2023-12-24 MED ORDER — EPHEDRINE SULFATE-NACL 50-0.9 MG/10ML-% IV SOSY
PREFILLED_SYRINGE | INTRAVENOUS | Status: DC | PRN
  Administered 2023-12-24: 10 mg via INTRAVENOUS
  Administered 2023-12-24: 15 mg via INTRAVENOUS

## 2023-12-24 MED ORDER — BUPIVACAINE LIPOSOME 1.3 % IJ SUSP
INTRAMUSCULAR | Status: AC
  Filled 2023-12-24: qty 20

## 2023-12-24 MED ORDER — GABAPENTIN 300 MG PO CAPS
ORAL_CAPSULE | ORAL | Status: AC
  Filled 2023-12-24: qty 1

## 2023-12-24 MED ORDER — LACTATED RINGERS IV SOLN: INTRAVENOUS | Status: DC

## 2023-12-24 MED ORDER — FENTANYL CITRATE (PF) 100 MCG/2ML IJ SOLN
25.0000 ug | INTRAMUSCULAR | Status: DC | PRN
  Administered 2023-12-24: 25 ug via INTRAVENOUS
  Administered 2023-12-24: 50 ug via INTRAVENOUS
  Administered 2023-12-24: 25 ug via INTRAVENOUS

## 2023-12-24 MED ORDER — DEXAMETHASONE SODIUM PHOSPHATE 10 MG/ML IJ SOLN
INTRAMUSCULAR | Status: DC | PRN
  Administered 2023-12-24: 10 mg via INTRAVENOUS

## 2023-12-24 MED ORDER — DROPERIDOL 2.5 MG/ML IJ SOLN: 0.6250 mg | Freq: Once | INTRAMUSCULAR | Status: DC | PRN

## 2023-12-24 MED ORDER — CHLORHEXIDINE GLUCONATE 0.12 % MT SOLN
15.0000 mL | Freq: Once | OROMUCOSAL | Status: AC
  Administered 2023-12-24: 15 mL via OROMUCOSAL

## 2023-12-24 MED ORDER — HYDROMORPHONE HCL 1 MG/ML IJ SOLN
1.0000 mg | Freq: Once | INTRAMUSCULAR | Status: AC
  Administered 2023-12-24: 1 mg via INTRAVENOUS

## 2023-12-24 MED ORDER — KETOROLAC TROMETHAMINE 30 MG/ML IJ SOLN
INTRAMUSCULAR | Status: AC
Start: 2023-12-24 — End: 2023-12-24
  Filled 2023-12-24: qty 1

## 2023-12-24 MED ORDER — FLUORESCEIN SODIUM 10 % IV SOLN
INTRAVENOUS | Status: DC | PRN
  Administered 2023-12-24: 5 mL via INTRAVENOUS

## 2023-12-24 MED ORDER — FENTANYL CITRATE (PF) 250 MCG/5ML IJ SOLN
INTRAMUSCULAR | Status: DC | PRN
  Administered 2023-12-24 (×5): 50 ug via INTRAVENOUS

## 2023-12-24 MED ORDER — POLYETHYLENE GLYCOL 3350 17 G PO PACK: 17.0000 g | PACK | Freq: Every day | ORAL | Status: DC | PRN

## 2023-12-24 MED ORDER — SCOPOLAMINE 1 MG/3DAYS TD PT72
1.0000 | MEDICATED_PATCH | TRANSDERMAL | Status: DC
  Administered 2023-12-24: 1 mg via TRANSDERMAL
  Filled 2023-12-24: qty 1

## 2023-12-24 MED ORDER — CLINDAMYCIN PHOSPHATE 900 MG/50ML IV SOLN
INTRAVENOUS | Status: AC
  Filled 2023-12-24: qty 50

## 2023-12-24 MED ORDER — LACTATED RINGERS IV SOLN: INTRAVENOUS | Status: AC

## 2023-12-24 MED ORDER — PROPOFOL 10 MG/ML IV BOLUS
INTRAVENOUS | Status: AC
  Filled 2023-12-24: qty 20

## 2023-12-24 MED ORDER — LIDOCAINE 2% (20 MG/ML) 5 ML SYRINGE: INTRAMUSCULAR | Status: DC | PRN

## 2023-12-24 MED ORDER — ROCURONIUM BROMIDE 10 MG/ML (PF) SYRINGE
PREFILLED_SYRINGE | INTRAVENOUS | Status: AC
Start: 2023-12-24 — End: 2023-12-24
  Filled 2023-12-24: qty 10

## 2023-12-24 MED ORDER — ACETAMINOPHEN 500 MG PO TABS
1000.0000 mg | ORAL_TABLET | ORAL | Status: AC
  Administered 2023-12-24: 1000 mg via ORAL

## 2023-12-24 MED ORDER — POVIDONE-IODINE 10 % EX SWAB: 2.0000 | Freq: Once | CUTANEOUS | Status: DC

## 2023-12-24 MED ORDER — PHENYLEPHRINE 80 MCG/ML (10ML) SYRINGE FOR IV PUSH (FOR BLOOD PRESSURE SUPPORT)
PREFILLED_SYRINGE | INTRAVENOUS | Status: DC | PRN
  Administered 2023-12-24 (×5): 160 ug via INTRAVENOUS

## 2023-12-24 MED ORDER — LIDOCAINE 2% (20 MG/ML) 5 ML SYRINGE
INTRAMUSCULAR | Status: AC
  Filled 2023-12-24: qty 5

## 2023-12-24 MED ORDER — DEXAMETHASONE SODIUM PHOSPHATE 10 MG/ML IJ SOLN
INTRAMUSCULAR | Status: AC
Start: 2023-12-24 — End: 2023-12-24
  Filled 2023-12-24: qty 1

## 2023-12-24 MED ORDER — POLYETHYLENE GLYCOL 3350 17 GM/SCOOP PO POWD
17.0000 g | Freq: Every day | ORAL | 1 refills | Status: DC
  Filled 2023-12-24: qty 238, 14d supply, fill #0

## 2023-12-24 MED ORDER — FENTANYL CITRATE (PF) 100 MCG/2ML IJ SOLN
INTRAMUSCULAR | Status: AC
Start: 2023-12-24 — End: 2023-12-24
  Filled 2023-12-24: qty 2

## 2023-12-24 MED ORDER — ACETAMINOPHEN 500 MG PO TABS
1000.0000 mg | ORAL_TABLET | Freq: Four times a day (QID) | ORAL | 1 refills | Status: AC | PRN
  Filled 2023-12-24: qty 120, 15d supply, fill #0

## 2023-12-24 MED ORDER — BUPIVACAINE HCL (PF) 0.5 % IJ SOLN
INTRAMUSCULAR | Status: DC | PRN
  Administered 2023-12-24 (×2): 10 mL

## 2023-12-24 MED ORDER — ROCURONIUM BROMIDE 10 MG/ML (PF) SYRINGE
PREFILLED_SYRINGE | INTRAVENOUS | Status: DC | PRN
  Administered 2023-12-24: 70 mg via INTRAVENOUS

## 2023-12-24 MED ORDER — TIZANIDINE HCL 4 MG PO TABS: 4.0000 mg | ORAL_TABLET | Freq: Three times a day (TID) | ORAL | Status: DC | PRN

## 2023-12-24 SURGICAL SUPPLY — 52 items
APPLICATOR ARISTA FLEXITIP XL (MISCELLANEOUS) IMPLANT
BARRIER ADHS 3X4 INTERCEED (GAUZE/BANDAGES/DRESSINGS) IMPLANT
CHLORAPREP W/TINT 26 (MISCELLANEOUS) ×1 IMPLANT
COVER BACK TABLE 60X90IN (DRAPES) ×1 IMPLANT
COVER TIP SHEARS 8 DVNC (MISCELLANEOUS) ×1 IMPLANT
DEFOGGER SCOPE WARM SEASHARP (MISCELLANEOUS) ×1 IMPLANT
DERMABOND ADVANCED .7 DNX12 (GAUZE/BANDAGES/DRESSINGS) ×1 IMPLANT
DRAPE ARM DVNC X/XI (DISPOSABLE) ×4 IMPLANT
DRAPE COLUMN DVNC XI (DISPOSABLE) ×1 IMPLANT
DRAPE SURG IRRIG POUCH 19X23 (DRAPES) ×1 IMPLANT
DRAPE UTILITY XL STRL (DRAPES) ×1 IMPLANT
DRIVER NDL MEGA SUTCUT DVNCXI (INSTRUMENTS) ×1 IMPLANT
DRIVER NDLE MEGA SUTCUT DVNCXI (INSTRUMENTS) ×1 IMPLANT
ELECTRODE REM PT RTRN 9FT ADLT (ELECTROSURGICAL) ×1 IMPLANT
FORCEPS BPLR FENES DVNC XI (FORCEP) ×1 IMPLANT
FORCEPS PROGRASP DVNC XI (FORCEP) ×1 IMPLANT
GLOVE BIOGEL PI IND STRL 7.0 (GLOVE) ×3 IMPLANT
GLOVE ECLIPSE 7.0 STRL STRAW (GLOVE) ×3 IMPLANT
GOWN STRL REUS W/ TWL XL LVL3 (GOWN DISPOSABLE) ×3 IMPLANT
HEMOSTAT ARISTA ABSORB 3G PWDR (HEMOSTASIS) IMPLANT
HIBICLENS CHG 4% 4OZ BTL (MISCELLANEOUS) ×2 IMPLANT
HOLDER FOLEY CATH W/STRAP (MISCELLANEOUS) ×1 IMPLANT
IRRIGATION SUCT STRKRFLW 2 WTP (MISCELLANEOUS) ×1 IMPLANT
KIT PINK PAD W/HEAD ARM REST (MISCELLANEOUS) ×1 IMPLANT
KIT TURNOVER KIT B (KITS) ×1 IMPLANT
LEGGING LITHOTOMY PAIR STRL (DRAPES) ×1 IMPLANT
MANIFOLD NEPTUNE II (INSTRUMENTS) IMPLANT
NDL INSUFFLATION 14GA 120MM (NEEDLE) IMPLANT
NEEDLE INSUFFLATION 14GA 120MM (NEEDLE) IMPLANT
NS IRRIG 1000ML POUR BTL (IV SOLUTION) ×1 IMPLANT
OBTURATOR OPTICALSTD 8 DVNC (TROCAR) ×1 IMPLANT
OCCLUDER COLPOPNEUMO (BALLOONS) IMPLANT
PACK ROBOT WH (CUSTOM PROCEDURE TRAY) ×1 IMPLANT
PAD OB MATERNITY 11 LF (PERSONAL CARE ITEMS) ×1 IMPLANT
PAD POSITIONING PINK XL (MISCELLANEOUS) ×1 IMPLANT
POWDER SURGICEL 3.0 GRAM (HEMOSTASIS) IMPLANT
RUMI II GYRUS 2.5CM BLUE (DISPOSABLE) IMPLANT
SCISSORS MNPLR CVD DVNC XI (INSTRUMENTS) ×1 IMPLANT
SEAL UNIV 5-12 XI (MISCELLANEOUS) ×4 IMPLANT
SEALER VESSEL EXT DVNC XI (MISCELLANEOUS) ×1 IMPLANT
SET CYSTO W/LG BORE CLAMP LF (SET/KITS/TRAYS/PACK) ×1 IMPLANT
SET TRI-LUMEN FLTR TB AIRSEAL (TUBING) ×1 IMPLANT
SUT VIC AB 0 CT1 27XBRD ANBCTR (SUTURE) IMPLANT
SUT VIC AB 4-0 PS2 18 (SUTURE) ×2 IMPLANT
SUT VLOC 180 0 9IN GS21 (SUTURE) ×1 IMPLANT
SYSTEM BAG RETRIEVAL 10MM (BASKET) IMPLANT
TIP ENDOSCOPIC SURGICEL (TIP) IMPLANT
TIP UTERINE 6.7X10CM GRN DISP (MISCELLANEOUS) IMPLANT
TOWEL GREEN STERILE (TOWEL DISPOSABLE) ×1 IMPLANT
TRAY FOLEY W/BAG SLVR 14FR (SET/KITS/TRAYS/PACK) ×1 IMPLANT
TROCAR PORT AIRSEAL 8X120 (TROCAR) ×1 IMPLANT
UNDERPAD 30X36 HEAVY ABSORB (UNDERPADS AND DIAPERS) ×1 IMPLANT

## 2023-12-24 NOTE — OR Nursing (Signed)
 Patient states she is not allergic to Hydromorphone -Dilaudid , She states she has taken Dilaudid . She doenst know why its on her allergys.  Andree Becton rn

## 2023-12-24 NOTE — Op Note (Signed)
 Jessica Roberson PROCEDURE DATE: 12/24/2023  PREOPERATIVE DIAGNOSIS: abnormal uterine bleeding, dysmenorrhea  POSTOPERATIVE DIAGNOSIS: abnormal uterine bleeding, dysmenorrhea PROCEDURE:    robotic assisted total laparoscopic hysterectomy, cystoscopy SURGEON: Mateen Franssen, MD ASSISTANT:  Jorene Moats, PA   An experienced assistant was required given the standard of surgical care given the complexity of the case.  This assistant was needed for exposure, dissection, suctioning, retraction, instrument exchange, and for overall help during the procedure.  INDICATIONS: 43 y.o. [id number] with AUB, dysmenorrhea.  Risks of surgery were discussed with the patient including but not limited to: bleeding which may require transfusion; infection which may require antibiotics; injury to surrounding organs; need for additional procedures including laparotomy;  and other postoperative/anesthesia complications. Written informed consent was obtained.    FINDINGS:  Normal external genitalia, mobile uterus with Normal contours.  Laparoscopically: normal upper abdominal survey with omental adhesion to LUQ anterior abdominal wall, mobile uterus with vesicouteirne adhesion most proominantly on right side, surigcally absent fallopian tubes, normal bilateral ovaries, bilateral ureters seen, mildly adhesed anterior cul de sac, normal posterior cul de sac Cystoscopically: normal bladder wall without apparent injury, bilateral ureteral orifices, fluorescein -stained urine from bilateral ureteral orifices   ANESTHESIA: General, paracervical block INTRAVENOUS FLUIDS:  1150 ml of LR ESTIMATED BLOOD LOSS:  50 ml URINE OUTPUT: 300 ml SPECIMENS: uterus, cervix COMPLICATIONS:  None immediate.  PROCEDURE: The risks, benefits, and alternatives of surgery were explained, understood, and accepted. Consents were signed. All questions were answered. She was taken to the operating room and general anesthesia was applied without  complication. She was placed in the dorsal lithotomy position and her abdomen and vagina were prepped and draped after she had been carefully positioned on the table. A bimanual exam revealed a 10 week size uterus that was mobile. Her adnexa were not enlarged. A Foley catheter was placed and it drained clear throughout the case. A speculum was placed and the cervix visualized and grasped with a tenaculum. The cervix was measured and the uterus was sounded to 10 cm. A Rumi uterine manipulator was placed with a 10cm tip and 2.5 cm cup.  Gloves were changed and attention was turned to the abdomen. All incisions were infiltrated with local anesthetic. An 8mm incision was made in the LUQ and an optiview airseal trocar was introduced into the abdomen. Initial attempt at entry was unsuccessful and there was insufflation prior to entry into through the peritoneum. Upon re-attempt, the trocar was introduced into the intraabdominal cavity and entry was confirmed with low opening intraabdominal pressure and visualization and the abdomen was then insufflated. After good pneumoperitoneum was established, the abdomen was surveyed including the upper abdomen. Trocars were placed in the LLQ and RLQ to assess the omental adhesion and the bowel around the point of entry and no signs of injury were noted. She was placed in Trendelenburg position and an incision was made just inferior to the umbilicus. The robot was docked and I proceeded with a robotic portion of the case.  The pelvis was inspected and the above findings were noted. The left utero-ovarian ligament was cauterized and divided. The ureters and the infundibulopelvic ligaments were identified. The round ligament was the cauterized and divided. The leaves of the broad were separated and the posterior leaf divided down to the level of the koh ring. The anterior leaf was divided. The bladder flap was developed. The uterine vessels were exposed and cauterized on the uterus  superior to the [place] ring. Attention was turned to the  right side where the same was done. The adhesion was carefully taken down with blunt and sharp dissection and the uterine vessels were skeletonized. The bladder flap was further developed and the uterine vessels were cauterized, divided and lateralized. The pubocervical fascia was exposed. The left uterine vessels were cauterized, divided and lateralized. The bladder was pushed out of the operative site and an anterior colpotomy was made. The colpotomy incision was extended circumferentially, following the blue outline of the Rumi manipulator. All pedicles were hemostatic.  The uterus was removed from the vagina with the fallopian tube segments. Bleeding from the cuff was addressed with monopolar and bipolar where there was more active bleeding. The vaginal cuff was closed with v-loc suture in a running fashion.  The pelvis was irrigated. The intraabdominal pressure was lowered assess hemostasis. Surgicel powder was applied and allowed to sit for 1 minute prior to rinsing off the excess. After determining excellent hemostasis, the robot was undocked. At this point I performed cystoscopy. The cystoscopy revealed fluorescein  ejection from both ureters.   The skin from all of the other ports was closed with 4-0 vicryl. 0.5% Marcaine  was injected into the port sites.  The patient was then extubated and taken to recovery in stable condition.   Sponge, lap and needle counts were correct x 2.  Carter Quarry, MD Minimally Invasive Gynecologic Surgery  Obstetrics and Gynecology, [facility] for [facility], [facility] 12/24/2023

## 2023-12-24 NOTE — Anesthesia Preprocedure Evaluation (Addendum)
 Anesthesia Evaluation  Patient identified by MRN, date of birth, ID band Patient awake    Reviewed: Allergy & Precautions, H&P , NPO status , Patient's Chart, lab work & pertinent test results  Airway Mallampati: II  TM Distance: >3 FB Neck ROM: Full    Dental  (+) Teeth Intact, Dental Advisory Given   Pulmonary neg sleep apnea, neg recent URI Hx/o Covid 05/2019   Pulmonary exam normal breath sounds clear to auscultation       Cardiovascular hypertension, Pt. on medications Normal cardiovascular exam Rhythm:Regular Rate:Normal     Neuro/Psych  Headaches PSYCHIATRIC DISORDERS Anxiety Depression    MS under control  Neuromuscular disease    GI/Hepatic Neg liver ROS,GERD  Controlled,,  Endo/Other  PCOS   Renal/GU negative Renal ROS     Musculoskeletal  (+) Arthritis ,    Abdominal  (+) + obese  Peds  Hematology  (+) Blood dyscrasia, anemia   Anesthesia Other Findings   Reproductive/Obstetrics negative OB ROS Menorrhagia Submucosal and Intramural fibroids HSV                              Anesthesia Physical Anesthesia Plan  ASA: 3  Anesthesia Plan: General   Post-op Pain Management: Tylenol  PO (pre-op)*, Gabapentin  PO (pre-op)* and Toradol  IV (intra-op)*   Induction: Intravenous  PONV Risk Score and Plan: 4 or greater and Treatment may vary due to age or medical condition, Midazolam , Dexamethasone , Ondansetron  and Scopolamine  patch - Pre-op  Airway Management Planned: Oral ETT  Additional Equipment: None  Intra-op Plan:   Post-operative Plan: Extubation in OR  Informed Consent: I have reviewed the patients History and Physical, chart, labs and discussed the procedure including the risks, benefits and alternatives for the proposed anesthesia with the patient or authorized representative who has indicated his/her understanding and acceptance.     Dental advisory  given  Plan Discussed with: CRNA  Anesthesia Plan Comments: (2 x PIV  Risks of anesthesia explained at length. This includes, but is not limited to, sore throat, damage to teeth, lips gums, tongue and vocal cords, nausea and vomiting, reactions to medications, stroke, heart attack, and death. All patient questions were answered and the patient wishes to proceed. )         Anesthesia Quick Evaluation

## 2023-12-24 NOTE — Discharge Instructions (Signed)
 Post Op Hysterectomy Instructions Please read the instructions below. Refer to these instructions for the next few weeks. These instructions provide you with general information on caring for yourself after surgery. Your caregiver may also give you specific instructions. While your treatment has been planned according to the most current medical practices available, unavoidable problems sometimes happen. If you have any problems or questions after you leave, please call your caregiver.  HOME CARE INSTRUCTIONS Healing will take time. You will have discomfort, tenderness, swelling and bruising at the operative site for a couple of weeks. This is normal and will get better as time goes on.  Only take over-the-counter or prescription medicines for pain, discomfort or fever as directed by your caregiver.  Do not take aspirin. It can cause bleeding.  Do not drive when taking pain medication.  Follow your caregiver's advice regarding diet, exercise, lifting, driving and general activities.  Resume your usual diet as directed and allowed.  Get plenty of rest and sleep.  Do not douche, use tampons, or have sexual intercourse until your caregiver gives you permission. .  Take your temperature if you feel hot or flushed.  You may shower today when you get home.  No tub bath for one week.   Do not drink alcohol until you are not taking any narcotic pain medications.  Try to have someone home with you for a week or two to help with the household activities.   Be careful over the next two to three weeks with any activities at home that involve lifting, pushing, or pulling.  Listen to your body--if something feels uncomfortable to do, then don't do it. Make sure you and your family understands everything about your operation and recovery.  Walking up stairs is fine. Do not sign any legal documents until you feel normal again.  Keep all your follow-up appointments as recommended by your caregiver.   PLEASE CALL  THE OFFICE IF: There is swelling, redness or increasing pain in the wound area.  Pus is coming from the wound.  You notice a bad smell from the wound or surgical dressing.  You have pain, redness and swelling from the intravenous site.  The wound is breaking open (the edges are not staying together).   You develop pain or bleeding when you urinate.  You develop abnormal vaginal discharge.  You have any type of abnormal reaction or develop an allergy to your medication.  You need stronger pain medication for your pain   SEEK IMMEDIATE MEDICAL CARE: You develop a temperature of 100.5 or higher.  You develop abdominal pain.  You develop chest pain.  You develop shortness of breath.  You pass out.  You develop pain, swelling or redness of your leg.  You develop heavy vaginal bleeding with or without blood clots.   MEDICATIONS: Restart your regular medications BUT wait one week before restarting all vitamins and mineral supplements Use Motrin 800mg  every 8 hours for the next several days.   Take Tylenol 1000mg  every 8 hours for the next several days Use oxycodone 5 mg every 4-6 hours. Taking motrin and tylenol should help reduce how often you use oxycodone.  You may use an over the counter stool softener like Colace or Dulcolax to help with starting a bowel movement.  You can also use miralax (polyethylene glycol). Start the day after you go home.  Warm liquids, fluids, and ambulation help too.  If you have not had a bowel movement in four days, you need to  call the office.

## 2023-12-24 NOTE — Progress Notes (Signed)
 PROGRESS NOTE  SUBJECTIVE: having cramping in lower abdomen. Has been able to void. Dizziness w/ ambulation. Not yet taking po due to nausea, recently given zofran . Confirmed able to tolerate ibuprofen  though not sure if able to take toradol . Confirmed takes gabapentin , baclofen , and tazandine at baseline. Denies chest pain and SOB.   OBJECTIVE:  Vitals:   12/24/23 1645 12/24/23 1716  BP: 103/73 113/66  Pulse: 76 78  Resp: 18 16  Temp:  97.7 F (36.5 C)  SpO2: 100% 95%   NAD Abdomen soft Incisions w/ clean/dry/intact with skin glue SCDs in place  A/P - will add scheduled NSAIDs to help with pain control - continue ambulation - ADAT  - SCDs and IS for ppx - carb controlled diet   Carter Quarry, MD, FACOG cell phone [phone]

## 2023-12-24 NOTE — Brief Op Note (Signed)
 12/24/2023  2:27 PM  PATIENT:  Jessica Roberson  43 y.o. female  PRE-OPERATIVE DIAGNOSIS:  Dysmenorrhea, Fibroids  POST-OPERATIVE DIAGNOSIS:  Dysmenorrhea, Fibroids  PROCEDURE:  Procedure(s) with comments: HYSTERECTOMY, TOTAL, ROBOT-ASSISTED (N/A) - TLH and Cystoscopy CYSTOSCOPY (N/A)  SURGEON:  Surgeons and Role:    DEWAINE Jeralyn Crutch, MD - Primary  PHYSICIAN ASSISTANT: Jorene Moats, PA  ASSISTANTS: none   ANESTHESIA:   local, general, and paracervical block  EBL:  50 mL   BLOOD ADMINISTERED:none  DRAINS: none   LOCAL MEDICATIONS USED:  BUPIVICAINE   SPECIMEN:  Source of Specimen:  uterus, cervix  DISPOSITION OF SPECIMEN:  PATHOLOGY  COUNTS:  YES  TOURNIQUET:  * No tourniquets in log *  DICTATION: .Note written in EPIC  PLAN OF CARE: extended recovery  PATIENT DISPOSITION:  PACU - hemodynamically stable.   Delay start of Pharmacological VTE agent (>24hrs) due to surgical blood loss or risk of bleeding: not applicable

## 2023-12-24 NOTE — Transfer of Care (Signed)
 Immediate Anesthesia Transfer of Care Note  Patient: Jessica Roberson  Procedure(s) Performed: HYSTERECTOMY, TOTAL, ROBOT-ASSISTED CYSTOSCOPY  Patient Location: PACU  Anesthesia Type:General  Level of Consciousness: awake, alert , and oriented  Airway & Oxygen Therapy: Patient Spontanous Breathing and Patient connected to face mask oxygen  Post-op Assessment: Report given to RN and Post -op Vital signs reviewed and stable  Post vital signs: stable  Last Vitals:  Vitals Value Taken Time  BP 125/90 12/24/23 14:51  Temp 36.7 C 12/24/23 14:51  Pulse 92 12/24/23 14:52  Resp 12 12/24/23 14:52  SpO2 98 % 12/24/23 14:52  Vitals shown include unfiled device data.  Last Pain:  Vitals:   12/24/23 0837  TempSrc: Oral  PainSc: 3       Patients Stated Pain Goal: 5 (12/24/23 0837)  Complications: No notable events documented.

## 2023-12-24 NOTE — Anesthesia Procedure Notes (Signed)
 Procedure Name: Intubation Date/Time: 12/24/2023 12:39 PM  Performed by: Vergie Ike LABOR, CRNAPre-anesthesia Checklist: Patient identified, Emergency Drugs available, Suction available and Patient being monitored Patient Re-evaluated:Patient Re-evaluated prior to induction Oxygen Delivery Method: Circle System Utilized Preoxygenation: Pre-oxygenation with 100% oxygen Induction Type: IV induction Ventilation: Mask ventilation without difficulty Laryngoscope Size: Miller and 3 Grade View: Grade I Tube type: Oral Tube size: 7.0 mm Number of attempts: 1 Airway Equipment and Method: Stylet Placement Confirmation: ETT inserted through vocal cords under direct vision, positive ETCO2 and breath sounds checked- equal and bilateral Secured at: 22 cm Tube secured with: Tape Dental Injury: Teeth and Oropharynx as per pre-operative assessment

## 2023-12-25 ENCOUNTER — Encounter (HOSPITAL_COMMUNITY): Payer: Self-pay | Admitting: Obstetrics and Gynecology

## 2023-12-25 ENCOUNTER — Other Ambulatory Visit (HOSPITAL_COMMUNITY): Payer: Self-pay

## 2023-12-25 DIAGNOSIS — N946 Dysmenorrhea, unspecified: Secondary | ICD-10-CM | POA: Diagnosis not present

## 2023-12-25 MED ORDER — ONDANSETRON HCL 4 MG PO TABS
4.0000 mg | ORAL_TABLET | Freq: Four times a day (QID) | ORAL | 0 refills | Status: DC | PRN
  Filled 2023-12-25: qty 18, 5d supply, fill #0

## 2023-12-25 NOTE — Progress Notes (Signed)
 Gynecology Progress Note  Admission Date: 12/24/2023 Current Date: 12/25/2023 8:16 AM  Jessica Roberson is a 43 y.o. [id number] POD#1 admitted for observation s/p RA-TLH, BS, cystoscopy for management of abnormal uterine bleeding.    History complicated by: Patient Active Problem List   Diagnosis Date Noted   Abnormal uterine bleeding (AUB) 12/24/2023   Dysmenorrhea 12/24/2023   Intramural and submucous leiomyoma of uterus 12/24/2023   COVID-19 07/01/2019   Insomnia 07/01/2019   Myalgia 07/01/2019   Neck pain 09/22/2018   Cervicogenic headache 09/22/2018   Genetic testing 05/16/2017   Family history of breast cancer    Family history of leukemia    Family history of thyroid  cancer    Family history of prostate cancer    Family history of breast cancer in first degree relative 03/19/2017   Family history of ovarian cancer 03/19/2017   Family history of uterine cancer 03/19/2017   Family history of stomach cancer 03/19/2017   Pre-syncope 08/21/2016   Other fatigue 03/01/2016   Vitamin D  deficiency 08/30/2015   Foot drop, right 08/30/2015   Optic neuritis 03/23/2015   Gait disturbance 09/27/2014   Numbness 09/27/2014   High risk medication use 09/27/2014   Carpal tunnel syndrome 10/02/2013   Ganglion cyst of wrist 10/02/2013   Hand paresthesia 10/02/2013   Knee pain 08/11/2013   Morbid obesity (HCC) 04/02/2013   Previous cesarean section 04/02/2013   Encounter for sterilization 04/02/2013   Pregnancy 04/02/2013   Encounter for postoperative care 04/02/2013   POLYCYSTIC OVARIAN DISEASE 06/05/2010   Sinusitis, chronic 06/02/2010   HIRSUTISM 05/12/2010   Anxiety state 03/31/2010   Hypersomnia with sleep apnea 12/20/2009   HYPERGLYCEMIA 12/09/2009   Genital herpes 11/23/2009   Herpes simplex virus (HSV) infection 11/23/2009   Vitamin D  deficiency 11/23/2009   GERD 11/08/2009   DEPRESSION, MILD 10/31/2009   Multiple sclerosis (HCC) 10/31/2009   MIGRAINES, HX OF 10/31/2009    MORBID OBESITY 09/27/2009   History of colonic polyps 09/27/2009   Abdominal pain 09/16/2009   Allergic rhinitis 11/29/2008   Arthropathia 11/16/2008   Common wart 11/04/2008   Adiposity 10/26/2008    ROS and patient/family/surgical history, located on admission H&P note dated 12/24/2023, have been reviewed, and there are no changes except as noted below Yesterday/Overnight Events:  Patient with lower abdominal cramping, nausea, dizziness with ambulation in immediate post-op period.   Subjective:  Patient doing well with pain well-controlled. Nausea resolved with Zofran  and tolerating PO. Voiding without complication.  No vaginal bleeding. Using incentive spirometer regularly. Patient denies dizziness with ambulation.  Objective:   Vitals:   12/24/23 1823 12/24/23 2017 12/25/23 0003 12/25/23 0814  BP: 120/67 (!) 107/58 116/66 110/67  Pulse: 80 68 62 (!) 50  Resp: 18 18  17   Temp: (!) 36.4 C (!) 36.3 C 36.7 C 36.4 C  TempSrc: Oral Oral Oral Oral  SpO2: 100% 96% 98% 99%  Weight:      Height:        Temp:  [36.3 C-36.7 C] 36.4 C (09/03 0814) Pulse Rate:  [50-80] 50 (09/03 0814) Resp:  [17-18] 17 (09/03 0814) BP: (107-120)/(58-67) 110/67 (09/03 0814) SpO2:  [96 %-100 %] 99 % (09/03 0814) I/O last 3 completed shifts: In: 2452.4 [I.V.:2452.4] Out: 710 [Urine:650; Other:10; Blood:50] No intake/output data recorded.  Intake/Output Summary (Last 24 hours) at 12/25/2023 1809 Last data filed at 12/25/2023 0300 Gross per 24 hour  Intake 852.4 ml  Output --  Net 852.4 ml  Current Vital Signs 24h Vital Sign Ranges  T 36.4 C Temp  Avg: 36.4 C  Min: 36.3 C  Max: 36.7 C  BP 110/67 BP  Min: 107/58  Max: 120/67  HR (!) 50 Pulse  Avg: 65  Min: 50  Max: 80  RR 17 Resp  Avg: 17.7  Min: 17  Max: 18  SaO2 99 % Room Air SpO2  Avg: 98.3 %  Min: 96 %  Max: 100 %       24 Hour I/O Current Shift I/O  Time Ins Outs 09/02 0701 - 09/03 0700 In: 2452.4 [I.V.:2452.4] Out: 710  [Urine:650] No intake/output data recorded.   Patient Vitals for the past 12 hrs:  BP Temp Temp src Pulse Resp SpO2  12/25/23 0814 110/67 36.4 C Oral (!) 50 17 99 %     Patient Vitals for the past 24 hrs:  BP Temp Temp src Pulse Resp SpO2  12/25/23 0814 110/67 36.4 C Oral (!) 50 17 99 %  12/25/23 0003 116/66 36.7 C Oral 62 -- 98 %  12/24/23 2017 (!) 107/58 (!) 36.3 C Oral 68 18 96 %  12/24/23 1823 120/67 (!) 36.4 C Oral 80 18 100 %    Physical exam: General appearance: alert, cooperative, and appears stated age Abdomen: soft, non-tender; bowel sounds normal; no masses,  no organomegaly GU: No gross VB Lungs: clear to auscultation bilaterally Heart: S1, S2 normal, no murmur, rub or gallop, regular rate and rhythm Extremities: no lower extremity edema Skin: Four incisions well-approximated without swelling, erythema, tenderness Psych: appropriate Neurologic: Grossly normal  Medications No current facility-administered medications for this encounter.   Current Outpatient Medications  Medication Sig Dispense Refill   acetaminophen  (TYLENOL ) 500 MG tablet Take 2 tablets (1,000 mg total) by mouth every 6 (six) hours as needed for moderate pain (pain score 4-6) or mild pain (pain score 1-3). 120 tablet 1   baclofen  (LIORESAL ) 10 MG tablet TAKE 1/2 TO 1 TABLET BY MOUTH 3 TIMES A DAY AS NEEDED FOR SPASTICITY. (Patient taking differently: Take 0.5-1 tablets by mouth 3 (three) times daily with meals as needed for muscle spasms. TAKE 1/2 TO 1 TABLET BY MOUTH 3 TIMES A DAY AS NEEDED FOR SPASTICITY.) 270 tablet 1   Cholecalciferol (VITAMIN D -3 PO) Take 100 mcg by mouth daily.     gabapentin  (NEURONTIN ) 300 MG capsule One po qAM, one po qPM and 2 po qHS (Patient taking differently: Take 300 mg by mouth 3 (three) times daily. One po qAM, one po qPM and 2 po qHS) 120 capsule 11   metFORMIN (GLUCOPHAGE) 500 MG tablet Take 1,000 mg by mouth daily.     Multiple Vitamins-Minerals (MULTIVITAMIN  WOMENS 50+ ADV) TABS Take 1 tablet by mouth daily.     Ofatumumab (KESIMPTA) 20 MG/0.4ML SOAJ Inject 20 mg into the skin every 28 (twenty-eight) days.     oxyCODONE  (OXY IR/ROXICODONE ) 5 MG immediate release tablet Take 1 tablet (5 mg total) by mouth every 4 (four) hours as needed for severe pain (pain score 7-10) or breakthrough pain. 15 tablet 0   polyethylene glycol powder (GLYCOLAX /MIRALAX ) 17 GM/SCOOP powder Dissolve 1 capful (17g) in 4-8 ounces of liquid and take by mouth daily. 238 g 1   spironolactone (ALDACTONE) 50 MG tablet Take 50 mg by mouth daily.     tiZANidine  (ZANAFLEX ) 4 MG tablet TAKE 1 TABLET BY MOUTH 3 TIMES A DAY FOR MUSCLE SPASMS (Patient taking differently: Take 4 mg by mouth 3 (three)  times daily as needed for muscle spasms. TAKE 1 TABLET BY MOUTH 3 TIMES A DAY FOR MUSCLE SPASMS) 270 tablet 1   LORazepam  (ATIVAN ) 1 MG tablet TAKE 1 TABLET (1 MG TOTAL) BY MOUTH AT BEDTIME AS NEEDED FOR SLEEP. Call [phone] to schedule follow up for ongoing refills (Patient taking differently: Take 1 mg by mouth at bedtime as needed. TAKE 1 TABLET (1 MG TOTAL) BY MOUTH AT BEDTIME AS NEEDED FOR SLEEP. Call [phone] to schedule follow up for ongoing refills) 30 tablet 0   ondansetron  (ZOFRAN ) 4 MG tablet Take 1 tablet (4 mg total) by mouth every 6 (six) hours as needed for nausea. 20 tablet 0      Labs  Recent Labs  Lab 12/19/23 0930  WBC 3.7*  HGB 13.6  HCT 39.8  PLT 257    Recent Labs  Lab 12/19/23 0930  NA 136  K 3.8  CL 104  CO2 23  BUN 8  CREATININE 0.80  CALCIUM 9.4  GLUCOSE 81   Assessment & Plan:  43 yo female patient on POD#1 s/p RA-TLH, BS, cystoscopy for definitive management of AUB, admitted for observation. Patient doing well in NAD, pain controlled and meeting milestones for discharge.   *GYN: 2-3 week and 8 week outpatient follow-up *Pain: Well-controlled on oxycodone  5 mg, NSAIDs *FEN/GI: carb-controlled diet *PPx: SCDs and IS *Dispo: Home  Code  Status: Prior   Jorene FORBES Moats, PA-C [facility] [employer] [facility]  @TODAY @ 8:16AM

## 2023-12-25 NOTE — Discharge Summary (Signed)
 Gynecology Postoperative Discharge Summary  Patient ID: Jessica Roberson MRN: [id number] DOB/AGE: 1981-04-13 43 y.o.  Admit Date: 12/24/2023 Discharge Date: 12/25/2023  Preoperative Diagnoses: Abnormal Uterine Bleeding  Procedures: Procedure(s) (LRB): HYSTERECTOMY, TOTAL, ROBOT-ASSISTED (N/A) CYSTOSCOPY (N/A)  Hospital Course:  Jessica Roberson is a 43 y.o. [id number]  admitted for scheduled surgery.  She underwent the procedures as mentioned above, her operation was uncomplicated. For further details about surgery, please refer to the operative report. Patient had an uncomplicated postoperative course. By time of discharge on POD#1, her pain was controlled on oral pain medications; she was ambulating, voiding without difficulty, tolerating regular diet and passing flatus. She was deemed stable for discharge to home.   Significant Labs:    Latest Ref Rng & Units 12/19/2023    9:30 AM 09/30/2023   11:12 AM 04/13/2022    9:15 AM  CBC  WBC 4.0 - 10.5 K/uL 3.7  5.0    Hemoglobin 12.0 - 15.0 g/dL 86.3  87.1  86.0   Hematocrit 36.0 - 46.0 % 39.8  40.7  41.0   Platelets 150 - 400 K/uL 257  258      Discharge Exam: Blood pressure 110/67, pulse (!) 50, temperature 36.4 C, temperature source Oral, resp. rate 17, height 5' 9 (1.753 m), weight 104.3 kg, SpO2 99%, unknown if currently breastfeeding. General appearance: alert and no distress  Resp: clear to auscultation bilaterally  Cardio: regular rate and rhythm  GI: soft, non-tender; bowel sounds normal; no masses, no organomegaly.  Incision: C/D/I, no erythema, no drainage noted Pelvic: scant blood on pad (done in presence of RN as chaperone)  Extremities: extremities normal, atraumatic, no cyanosis or edema and Homans sign is negative, no sign of DVT  Discharged Condition: Stable  Disposition: Discharge disposition: 01-Home or Self Care      Discharge Instructions     Diet - low sodium heart healthy   Complete by: As directed     Increase activity slowly   Complete by: As directed    No wound care   Complete by: As directed       Allergies as of 12/25/2023       Reactions   Aspirin Anaphylaxis   Pt states that she tolerates Ibuprofen    Penicillins Anaphylaxis   Cyclobenzaprine  Itching   Bad dreams   Tramadol Hcl Hives        Medication List     STOP taking these medications    megestrol  40 MG tablet Commonly known as: MEGACE        TAKE these medications    Acetaminophen  Extra Strength 500 MG Tabs Take 2 tablets (1,000 mg total) by mouth every 6 (six) hours as needed for moderate pain (pain score 4-6) or mild pain (pain score 1-3).   baclofen  10 MG tablet Commonly known as: LIORESAL  TAKE 1/2 TO 1 TABLET BY MOUTH 3 TIMES A DAY AS NEEDED FOR SPASTICITY. What changed:  how much to take how to take this when to take this reasons to take this   gabapentin  300 MG capsule Commonly known as: NEURONTIN  One po qAM, one po qPM and 2 po qHS What changed:  how much to take how to take this when to take this   Kesimpta 20 MG/0.4ML Soaj Generic drug: Ofatumumab Inject 20 mg into the skin every 28 (twenty-eight) days.   LORazepam  1 MG tablet Commonly known as: ATIVAN  TAKE 1 TABLET (1 MG TOTAL) BY MOUTH AT BEDTIME AS NEEDED FOR SLEEP. Call [phone] to  schedule follow up for ongoing refills What changed:  how much to take how to take this when to take this reasons to take this   metFORMIN 500 MG tablet Commonly known as: GLUCOPHAGE Take 1,000 mg by mouth daily.   Multivitamin Womens 50+ Adv Tabs Take 1 tablet by mouth daily.   ondansetron  4 MG tablet Commonly known as: ZOFRAN  Take 1 tablet (4 mg total) by mouth every 6 (six) hours as needed for nausea.   oxyCODONE  5 MG immediate release tablet Commonly known as: Oxy IR/ROXICODONE  Take 1 tablet (5 mg total) by mouth every 4 (four) hours as needed for severe pain (pain score 7-10) or breakthrough pain.   polyethylene glycol powder  17 GM/SCOOP powder Commonly known as: GLYCOLAX /MIRALAX  Dissolve 1 capful (17g) in 4-8 ounces of liquid and take by mouth daily.   spironolactone 50 MG tablet Commonly known as: ALDACTONE Take 50 mg by mouth daily.   tiZANidine  4 MG tablet Commonly known as: ZANAFLEX  TAKE 1 TABLET BY MOUTH 3 TIMES A DAY FOR MUSCLE SPASMS What changed:  how much to take how to take this when to take this reasons to take this   VITAMIN D -3 PO Take 100 mcg by mouth daily.       Future Appointments  Date Time Provider Department Center  01/15/2024 10:35 AM Delora Gravatt E, PA-C [facility] None  01/29/2024 10:35 AM Allisson Schindel E, PA-C [facility] None  02/07/2024 10:55 AM Ajewole, Christana, MD [facility] None  03/05/2024  1:00 PM Sater, Charlie LABOR, MD [facility] None   Total discharge time: 15 minutes   Signed:  Jorene FORBES Moats, [facility] for [employer], [facility] 12/25/23

## 2023-12-25 NOTE — Plan of Care (Signed)
  Problem: Education: Goal: Knowledge of General Education information will improve Description: Including pain rating scale, medication(s)/side effects and non-pharmacologic comfort measures Outcome: Completed/Met   Problem: Health Behavior/Discharge Planning: Goal: Ability to manage health-related needs will improve Outcome: Completed/Met   Problem: Clinical Measurements: Goal: Ability to maintain clinical measurements within normal limits will improve Outcome: Completed/Met Goal: Will remain free from infection Outcome: Completed/Met Goal: Diagnostic test results will improve Outcome: Completed/Met Goal: Respiratory complications will improve Outcome: Completed/Met Goal: Cardiovascular complication will be avoided Outcome: Completed/Met   Problem: Activity: Goal: Risk for activity intolerance will decrease Outcome: Completed/Met   Problem: Nutrition: Goal: Adequate nutrition will be maintained Outcome: Completed/Met   Problem: Coping: Goal: Level of anxiety will decrease Outcome: Completed/Met   Problem: Elimination: Goal: Will not experience complications related to bowel motility Outcome: Completed/Met Goal: Will not experience complications related to urinary retention Outcome: Completed/Met   Problem: Pain Managment: Goal: General experience of comfort will improve and/or be controlled Outcome: Completed/Met   Problem: Safety: Goal: Ability to remain free from injury will improve Outcome: Completed/Met   Problem: Skin Integrity: Goal: Risk for impaired skin integrity will decrease Outcome: Completed/Met   Problem: Education: Goal: Knowledge of the prescribed therapeutic regimen will improve Outcome: Completed/Met Goal: Understanding of sexual limitations or changes related to disease process or condition will improve Outcome: Completed/Met Goal: Individualized Educational Video(s) Outcome: Completed/Met   Problem: Self-Concept: Goal: Communication of  feelings regarding changes in body function or appearance will improve Outcome: Completed/Met   Problem: Skin Integrity: Goal: Demonstration of wound healing without infection will improve Outcome: Completed/Met

## 2023-12-26 LAB — SURGICAL PATHOLOGY

## 2023-12-26 NOTE — Anesthesia Postprocedure Evaluation (Signed)
 Anesthesia Post Note  Patient: Poetry N Larose  Procedure(s) Performed: HYSTERECTOMY, TOTAL, ROBOT-ASSISTED CYSTOSCOPY     Patient location during evaluation: PACU Anesthesia Type: General Level of consciousness: sedated and patient cooperative Pain management: pain level controlled Vital Signs Assessment: post-procedure vital signs reviewed and stable Respiratory status: spontaneous breathing Cardiovascular status: stable Anesthetic complications: no   No notable events documented.  Last Vitals:  Vitals:   12/25/23 0003 12/25/23 0814  BP: 116/66 110/67  Pulse: 62 (!) 50  Resp:  17  Temp: 36.7 C 36.4 C  SpO2: 98% 99%    Last Pain:  Vitals:   12/25/23 0845  TempSrc:   PainSc: 0-No pain                 Norleen Pope

## 2023-12-27 DIAGNOSIS — Z4889 Encounter for other specified surgical aftercare: Secondary | ICD-10-CM | POA: Diagnosis not present

## 2023-12-30 ENCOUNTER — Ambulatory Visit: Payer: Self-pay | Admitting: Obstetrics and Gynecology

## 2023-12-30 DIAGNOSIS — F321 Major depressive disorder, single episode, moderate: Secondary | ICD-10-CM | POA: Diagnosis not present

## 2023-12-31 DIAGNOSIS — F321 Major depressive disorder, single episode, moderate: Secondary | ICD-10-CM | POA: Diagnosis not present

## 2024-01-01 DIAGNOSIS — F4312 Post-traumatic stress disorder, chronic: Secondary | ICD-10-CM | POA: Diagnosis not present

## 2024-01-02 ENCOUNTER — Telehealth: Payer: Self-pay

## 2024-01-02 NOTE — Telephone Encounter (Signed)
 VM left by pt regarding incision from recent hysterectomy on 9/2. Reports incision is puffy, tender, and has pus coming out of it. Went to urgent care on 9/5 but was only told to apply neosporin, has not had improvement. Message sent to Dr. Jeralyn regarding this to see what she advises.

## 2024-01-03 ENCOUNTER — Ambulatory Visit (INDEPENDENT_AMBULATORY_CARE_PROVIDER_SITE_OTHER): Admitting: Obstetrics and Gynecology

## 2024-01-03 ENCOUNTER — Encounter: Payer: Self-pay | Admitting: Obstetrics and Gynecology

## 2024-01-03 VITALS — BP 108/74 | HR 67 | Ht 69.0 in | Wt 231.0 lb

## 2024-01-03 DIAGNOSIS — T8141XA Infection following a procedure, superficial incisional surgical site, initial encounter: Secondary | ICD-10-CM

## 2024-01-03 MED ORDER — SULFAMETHOXAZOLE-TRIMETHOPRIM 800-160 MG PO TABS: 1.0000 | ORAL_TABLET | Freq: Two times a day (BID) | ORAL | 0 refills | Status: DC

## 2024-01-03 NOTE — Progress Notes (Signed)
    GYNECOLOGY VISIT  Patient name: Jessica Roberson MRN [id number]  Date of birth: [birth year] Chief Complaint:   Wound Check  History:  Jessica Roberson 43 y.o. POD#10 s/p uncomplicated RA-TLH, BS cysto presents with wound concerns. Went to urgent care Friday and incision looked angry and was draining. Incision was cleaned and instructed to put neosporin. RLQ incision with some redness but otherwise all remaining incisions are fine. No vaginal bleeding. Not requiring narcotics. Voiding and BM without issue. Tolerating po.   The following portions of the patient's history were reviewed and updated as appropriate: allergies, current medications, past family history, past medical history, past social history, past surgical history and problem list.   Health Maintenance:   Last pap No results found for: DIAGPAP, HPVHIGH, ADEQPAP  Health Maintenance  Topic Date Due   COVID-19 Vaccine (1) Never done   Hepatitis C Screening  Never done   DTaP/Tdap/Td vaccine (1 - Tdap) Never done   Hepatitis B Vaccine (1 of 3 - 19+ 3-dose series) Never done   HPV Vaccine (1 - Risk 3-dose SCDM series) Never done   Breast Cancer Screening  04/11/2022   Flu Shot  11/22/2023   HIV Screening  Completed   Pneumococcal Vaccine  Aged Out   Meningitis B Vaccine  Aged Out      Review of Systems:  Pertinent items are noted in HPI. Comprehensive review of systems was otherwise negative.   Objective:  Physical Exam BP 108/74   Pulse 67   Ht 5' 9 (1.753 m)   Wt 231 lb (104.8 kg)   LMP 11/12/2023 (Exact Date)   BMI 34.11 kg/m    Physical Exam Vitals and nursing note reviewed.  Constitutional:      Appearance: Normal appearance.  HENT:     Head: Normocephalic and atraumatic.  Pulmonary:     Effort: Pulmonary effort is normal.  Abdominal:     Comments: RUQ and infraumbilical incisions clean/dry/intact RLQ incision slight erythema but otherwise unremarkable LLQ with slight erythema,  fibrinous tissue superficially, granulation of subcutaneous tissue, and intact fascia with no fluctuance or drainage  LLQ incision suture removed, cleaned with betadine  and saline and then re-approximated with steri strips  Skin:    General: Skin is warm and dry.  Neurological:     General: No focal deficit present.     Mental Status: She is alert.  Psychiatric:        Mood and Affect: Mood normal.        Behavior: Behavior normal.        Thought Content: Thought content normal.        Judgment: Judgment normal.         Assessment & Plan:  1. Superficial incisional infection of surgical site (Primary) Incision cleaned and re-approximated with steri strips. Instructed to keep incision clean and dry, no additional neosporin application and will proceed with short course of systemic antibiotics given erythema and disruption of wound. Return in 1 week for RN wound check and keep usual postop appointments.   - sulfamethoxazole -trimethoprim  (BACTRIM  DS) 800-160 MG tablet; Take 1 tablet by mouth 2 (two) times daily.  Dispense: 10 tablet; Refill: 0    Carter Quarry, MD Min[facility]ally In[facility] for [facility], [facility]

## 2024-01-03 NOTE — Progress Notes (Addendum)
 43 y.o. GYN presents for Post Op wound check.  C/o redness, puss, itching on incision site.  Denies fever, chills, NV, pain.

## 2024-01-06 DIAGNOSIS — F321 Major depressive disorder, single episode, moderate: Secondary | ICD-10-CM | POA: Diagnosis not present

## 2024-01-07 DIAGNOSIS — F4312 Post-traumatic stress disorder, chronic: Secondary | ICD-10-CM | POA: Diagnosis not present

## 2024-01-08 DIAGNOSIS — F4312 Post-traumatic stress disorder, chronic: Secondary | ICD-10-CM | POA: Diagnosis not present

## 2024-01-09 ENCOUNTER — Encounter: Payer: Self-pay | Admitting: Obstetrics and Gynecology

## 2024-01-09 ENCOUNTER — Ambulatory Visit

## 2024-01-09 DIAGNOSIS — Z4889 Encounter for other specified surgical aftercare: Secondary | ICD-10-CM

## 2024-01-09 NOTE — Progress Notes (Signed)
 Subjective:     Jessica Roberson is a 43 y.o. female who presents to the clinic 2 weeks status post hysterectomy. Pt reports incision is healing well. Pt reports significant improvement after completing antibiotics.      Objective:    BP 118/81   Pulse 79   LMP 11/12/2023 (Exact Date)  General:  alert, well appearing, in no apparent distress  Incision:   healing well, no drainage, no erythema, no hernia, no seroma, no swelling, no dehiscence, incision well approximated     Assessment:    Doing well postoperatively.   Plan:    1. Continue any current medications. 2. Wound care discussed. 3. Follow up: Post op appt 9/24.   Duwaine LOISE Galla, RN

## 2024-01-10 DIAGNOSIS — Z7689 Persons encountering health services in other specified circumstances: Secondary | ICD-10-CM | POA: Diagnosis not present

## 2024-01-10 DIAGNOSIS — E66811 Obesity, class 1: Secondary | ICD-10-CM | POA: Diagnosis not present

## 2024-01-10 DIAGNOSIS — E282 Polycystic ovarian syndrome: Secondary | ICD-10-CM | POA: Diagnosis not present

## 2024-01-13 DIAGNOSIS — F321 Major depressive disorder, single episode, moderate: Secondary | ICD-10-CM | POA: Diagnosis not present

## 2024-01-14 DIAGNOSIS — F321 Major depressive disorder, single episode, moderate: Secondary | ICD-10-CM | POA: Diagnosis not present

## 2024-01-15 ENCOUNTER — Ambulatory Visit: Admitting: Physician Assistant

## 2024-01-15 ENCOUNTER — Encounter: Payer: Self-pay | Admitting: Physician Assistant

## 2024-01-15 VITALS — BP 131/76 | HR 70 | Ht 69.0 in | Wt 229.0 lb

## 2024-01-15 DIAGNOSIS — F4312 Post-traumatic stress disorder, chronic: Secondary | ICD-10-CM | POA: Diagnosis not present

## 2024-01-15 DIAGNOSIS — Z4889 Encounter for other specified surgical aftercare: Secondary | ICD-10-CM | POA: Diagnosis not present

## 2024-01-15 NOTE — Progress Notes (Signed)
   POSTOPERATIVE VISIT NOTE   Subjective:     Jessica Roberson is a 43 y.o. [id number] who presents to the clinic 3 weeks status post robotic assisted total laparoscopic hysterectomy and bilateral salpingectomy for abnormal uterine bleeding. Eating a regular diet without difficulty. Bowel movements are normal. The patient is not having any pain or vaginal bleeding. She has not resumed sexual activity. Patient completed prescribed bactrim  course in visit interim for redness and purulent discharge from left lower incision site. Today, she reports it is improved without discharge, erythema, or tenderness.   The following portions of the patient's history were reviewed and updated as appropriate: allergies, current medications, past family history, past medical history, past social history, past surgical history, and problem list..   Review of Systems Pertinent items noted in HPI and remainder of comprehensive ROS otherwise negative.    Objective:    BP 131/76   Pulse 70   Ht 5' 9 (1.753 m)   Wt 229 lb (103.9 kg)   LMP 11/12/2023 (Exact Date)   BMI 33.82 kg/m  General:  alert, cooperative, and appears stated age  Abdomen: soft, bowel sounds active, non-tender  Incision:   healing well, no drainage, no erythema, no hernia, no seroma, no swelling, incision well approximated  Pelvic:   Vaginal: normal mucosa without prolapse or lesions. Granulation tissue present at vaginal cuff site.    Pathology Results: FINAL MICROSCOPIC DIAGNOSIS:   A. UTERUS AND CERVIX, HYSTERECTOMY:  - Uterus with leiomyoma, 0.8 cm  - Benign inactive endometrium  - Adenomyosis  - Benign unremarkable cervix  - No evidence of malignancy    Assessment:   Doing well postoperatively. Operative findings again reviewed.  Pathology report discussed.   Plan:   1. Encounter for postoperative care (Primary) Anticipated return to work: 2-3 weeks. Follow up: 02/27/24 appointment scheduled with Dr. Jeralyn Jorene Moats,  PA-C [facility]

## 2024-01-15 NOTE — Progress Notes (Signed)
 Pt presents for post-op fu

## 2024-01-20 DIAGNOSIS — F321 Major depressive disorder, single episode, moderate: Secondary | ICD-10-CM | POA: Diagnosis not present

## 2024-01-21 DIAGNOSIS — F321 Major depressive disorder, single episode, moderate: Secondary | ICD-10-CM | POA: Diagnosis not present

## 2024-01-27 DIAGNOSIS — F321 Major depressive disorder, single episode, moderate: Secondary | ICD-10-CM | POA: Diagnosis not present

## 2024-01-28 DIAGNOSIS — F321 Major depressive disorder, single episode, moderate: Secondary | ICD-10-CM | POA: Diagnosis not present

## 2024-01-29 ENCOUNTER — Encounter: Admitting: Physician Assistant

## 2024-02-01 DIAGNOSIS — M25561 Pain in right knee: Secondary | ICD-10-CM | POA: Diagnosis not present

## 2024-02-03 DIAGNOSIS — Z96651 Presence of right artificial knee joint: Secondary | ICD-10-CM | POA: Diagnosis not present

## 2024-02-04 DIAGNOSIS — F321 Major depressive disorder, single episode, moderate: Secondary | ICD-10-CM | POA: Diagnosis not present

## 2024-02-07 ENCOUNTER — Encounter: Admitting: Obstetrics and Gynecology

## 2024-02-10 DIAGNOSIS — F321 Major depressive disorder, single episode, moderate: Secondary | ICD-10-CM | POA: Diagnosis not present

## 2024-02-17 DIAGNOSIS — F321 Major depressive disorder, single episode, moderate: Secondary | ICD-10-CM | POA: Diagnosis not present

## 2024-02-27 ENCOUNTER — Other Ambulatory Visit: Payer: Self-pay

## 2024-02-27 ENCOUNTER — Ambulatory Visit (INDEPENDENT_AMBULATORY_CARE_PROVIDER_SITE_OTHER): Admitting: Obstetrics and Gynecology

## 2024-02-27 VITALS — BP 118/81 | HR 65 | Wt 228.0 lb

## 2024-02-27 DIAGNOSIS — N946 Dysmenorrhea, unspecified: Secondary | ICD-10-CM

## 2024-02-27 DIAGNOSIS — N939 Abnormal uterine and vaginal bleeding, unspecified: Secondary | ICD-10-CM

## 2024-02-27 DIAGNOSIS — Z09 Encounter for follow-up examination after completed treatment for conditions other than malignant neoplasm: Secondary | ICD-10-CM

## 2024-02-27 NOTE — Progress Notes (Signed)
   POSTOPERATIVE VISIT NOTE   Subjective:     Jessica Roberson is a 43 y.o. [id number] who presents to the clinic 9 weeks status post robotic assisted total laparoscopic hysterectomy, bilateral salpingectomy, and cystoscopy for abnormal uterine bleeding and dysmenorrhea. Eating a regular diet without difficulty. Bowel movements are normal. The patient is not having any pain. Incision: doing well Vaginal bleeding: none Resumed sexual acitivity: no- waiting for suitable partner  The following portions of the patient's history were reviewed and updated as appropriate: allergies, current medications, past family history, past medical history, past social history, past surgical history, and problem list..   Review of Systems Pertinent items are noted in HPI.    Objective:    BP 118/81   Pulse 65   Wt 228 lb (103.4 kg)   LMP 11/12/2023 (Exact Date)   BMI 33.67 kg/m  General:  alert, cooperative, and no distress  Abdomen: soft, non-tender  Incision:   healing well, no drainage, no erythema, no hernia, no seroma, no swelling, no dehiscence, incision well approximated  Pelvic:   Vaginal cuff intact on visual inspection and digital palpation     Pathology Results: FINAL MICROSCOPIC DIAGNOSIS:   A. UTERUS AND CERVIX, HYSTERECTOMY:  - Uterus with leiomyoma, 0.8 cm  - Benign inactive endometrium  - Adenomyosis  - Benign unremarkable cervix  - No evidence of malignancy    Assessment:   Doing well postoperatively. Operative findings again reviewed. Pathology report discussed.   Plan:    1. Postop check (Primary) Doing well. Ok to return to activities slowly   2. Dysmenorrhea 3. Abnormal uterine bleeding (AUB) Resolved s/p TLH, BS  Activity restrictions: none Anticipated return to work: not applicable. Follow up: as needed/annual  Carter Quarry, MD Obstetrician & Gynecologist, [facility] for [employer], [facility]

## 2024-03-05 ENCOUNTER — Ambulatory Visit (INDEPENDENT_AMBULATORY_CARE_PROVIDER_SITE_OTHER): Admitting: Neurology

## 2024-03-05 VITALS — BP 117/83 | HR 66 | Ht 69.0 in | Wt 232.5 lb

## 2024-03-05 DIAGNOSIS — G35A Relapsing-remitting multiple sclerosis: Secondary | ICD-10-CM

## 2024-03-05 DIAGNOSIS — R200 Anesthesia of skin: Secondary | ICD-10-CM | POA: Diagnosis not present

## 2024-03-05 DIAGNOSIS — M791 Myalgia, unspecified site: Secondary | ICD-10-CM

## 2024-03-05 DIAGNOSIS — M542 Cervicalgia: Secondary | ICD-10-CM

## 2024-03-05 DIAGNOSIS — Z79899 Other long term (current) drug therapy: Secondary | ICD-10-CM

## 2024-03-05 DIAGNOSIS — R5383 Other fatigue: Secondary | ICD-10-CM

## 2024-03-05 NOTE — Progress Notes (Signed)
 [facility]  PATIENT: Jessica Roberson DOB: [birth year]  REFERRING DOCTOR OR PCP:  Eleanor Ponto SOURCE: patient  _________________________________   HISTORICAL  CHIEF COMPLAINT:  Chief Complaint  Patient presents with   Follow-up    Pt in room 10. Alone. Here for MS follow up.    HISTORY OF PRESENT ILLNESS:  Jessica Roberson is a 43 y.o. woman with MS .   Update 03/05/2024:  She started Ksimpta due to an exacerbation earlier in 2025.   She has tolerated it well.  Depsite a clear cut level on her examination 09/2023, MRI thoracic spine did not show a definite new focus.     She had Lemtrada October 2018 and October 2019.   She completed the REMS program.  MRI 06/19/2022 was unchanged compared to 2019.  MRI cervical spine 03/2023 also no new lesions  She denies any new neurologic symptom.   The waist down numbness and right arm numbness has completely resolved though still has mild tingling/numbness on the left.   Balance is about the same  Gait is fine for short distnce but long distances are more difficult.   She has a mild right foot drop, worse if she wals longer distance, fatigue or heat.   She notes this in airports when walking longer distance.   She uses the bannister on stairs.   No falls.  She has mild right leg and neck/muscle spasm.   Tizanidine  helps the myalgias better than baclofen  but makes her very sleepy.  Takes just at night as t makes her sleepy.   She takes baclofen  once a day also as less sleepiness.  Her left arm pain and tingling is unchanged.   NCV/EMG was normal in 06/2023.   She notes her grip is weaker.       She has mild urinary urgency but no new issues.       Vision is a little blurry even with her glasses when she is tired..     She has fatigue most days  She sleeps poorly some nights.  She sometimes needs a nap.   She has mild depression and some anxiety.  She sometimes feels irritable.    She notes a little more brain fog.   She is working on her PhD (education).  She notes mild reduced recall..  She had a hysterectomy and recovered easily.   MS History:   She was diagnosed with multiple sclerosis in 2006 after presenting with left facial numbness and vision changes. MRIs of the brain were consistent with MS. She also had a lumbar puncture in the spinal fluid was consistent with MS. She initially saw a Dr. at [facility] neurologic and then began to see Dr. Juliane at [facility].   She was placed on Betaseron but switched to Copaxone due to injection site reactions. Unfortunately, she also had injection site reactions on Copaxone. Around 2010, she was started on Tysabri.  Due to a concern about PML, she wished to switch therapy. She was screened for a drug study but could not get the MRI's due to braces. She started Gilenya  in 2012. Done well on that therapy she stopped twice, once for insurance reasons and once for pregnancy and resumed therapy after each pause. She has not had any definite exacerbations while on Gilenya . She tolerates it very well. She had optic neuritis in 2017.   MRI 08/2016 showed a new focus not present in 2016.  She was switched to Lemtrada.  She had  the first year October 2018 and the second year October 2019.   She has an apparent thoracic spinal cord exacerbation 09/2023.   Kesimpta was discussed   Imaging: MRI cervical spibe 04/15/2023 showed There are T2 hyperintense foci within the spinal cord posterolaterally to the right adjacent to C3-C4 and posterolaterally to the left adjacent to C4-C5.  Both of these were present on the MRI from 2019.  They do not enhance.  They are consistent with chronic demyelinating plaque associated with multiple sclerosis.   At C4-C5, there is a small right paramedian disc protrusion effacing the thecal sac.  There is no nerve root compression.  Degenerative changes at this level have progressed compared to the 2019 MRI.  MRI of the brain 06/19/2022 showed no new  lesions.  MRI cervical spine 01/16/2018 shows At C4 level, there is a posterior spinal cord chronic demyelinating plaque.  No acute plaques  MRI brain 01/16/2018 shows Multiple supratentorial and infratentorial chronic demyelinating plaques. No acute plaques. No change from MRI on 09/30/14  MRI thoracic spine 7/1//2025 showed a normal spinal cord  Other: NCV/EMG 06/24/2023 of left arm was nrmal   REVIEW OF SYSTEMS: Constitutional: No fevers, chills, sweats, or change in appetite.  She notes a lot more fatigue. Eyes: see above Ear, nose and throat: No hearing loss, ear pain, nasal congestion, sore throat Cardiovascular: No chest pain, palpitations Respiratory:  No shortness of breath at rest or with exertion.   No wheezes GastrointestinaI: No nausea, vomiting, diarrhea, abdominal pain, fecal incontinence Genitourinary:  No dysuria, urinary retention or frequency.  No nocturia. Musculoskeletal: She reports neck pain and some muscle aches Integumentary: No rash, pruritus, skin lesions Neurological: as above Psychiatric: No depression at this time.  No anxiety Endocrine: No palpitations, diaphoresis, change in appetite, change in weigh or increased thirst Hematologic/Lymphatic:  No anemia, purpura, petechiae. Allergic/Immunologic: No itchy/runny eyes, nasal congestion, recent allergic reactions, rashes  ALLERGIES: Allergies  Allergen Reactions   Aspirin Anaphylaxis    Pt states that she tolerates Ibuprofen    Penicillins Anaphylaxis   Cyclobenzaprine  Itching    Bad dreams   Tramadol Hcl Hives    HOME MEDICATIONS:  Current Outpatient Medications:    acetaminophen  (TYLENOL ) 500 MG tablet, Take 2 tablets (1,000 mg total) by mouth every 6 (six) hours as needed for moderate pain (pain score 4-6) or mild pain (pain score 1-3)., Disp: 120 tablet, Rfl: 1   baclofen  (LIORESAL ) 10 MG tablet, TAKE 1/2 TO 1 TABLET BY MOUTH 3 TIMES A DAY AS NEEDED FOR SPASTICITY., Disp: 270 tablet, Rfl: 1    Cholecalciferol (VITAMIN D -3 PO), Take 100 mcg by mouth daily., Disp: , Rfl:    gabapentin  (NEURONTIN ) 300 MG capsule, One po qAM, one po qPM and 2 po qHS, Disp: 120 capsule, Rfl: 11   metFORMIN (GLUCOPHAGE) 500 MG tablet, Take 1,000 mg by mouth daily., Disp: , Rfl:    Multiple Vitamins-Minerals (MULTIVITAMIN WOMENS 50+ ADV) TABS, Take 1 tablet by mouth daily., Disp: , Rfl:    Ofatumumab (KESIMPTA) 20 MG/0.4ML SOAJ, Inject 20 mg into the skin every 28 (twenty-eight) days., Disp: , Rfl:    spironolactone (ALDACTONE) 50 MG tablet, Take 50 mg by mouth daily., Disp: , Rfl:    tiZANidine  (ZANAFLEX ) 4 MG tablet, TAKE 1 TABLET BY MOUTH 3 TIMES A DAY FOR MUSCLE SPASMS, Disp: 270 tablet, Rfl: 1  PAST MEDICAL HISTORY: Past Medical History:  Diagnosis Date   Abnormal uterine bleeding (AUB)    Family history of  breast cancer    Family history of leukemia    Family history of ovarian cancer    Family history of prostate cancer    Family history of thyroid  cancer    GAD (generalized anxiety disorder)    Hirsutism    History of anal fissures    History of gastric ulcer 09/2009   History of migraine    History of pregnancy induced hypertension 2008   MDD (major depressive disorder)    Multiple sclerosis 2006   neurologist--- dr vear;  dx 2006,  occasional gait disturbance, myalgia/ spasticity,   left arm numbness/ weakness,  treated for last exacerbation 06-/2025   Muscle spasticity    d/t MS   Myalgia    d/t MS   OA (osteoarthritis)    PCOS (polycystic ovarian syndrome)    Uterine fibroid    Wears glasses     PAST SURGICAL HISTORY: Past Surgical History:  Procedure Laterality Date   CARPAL TUNNEL RELEASE Left 12/31/2013   via endoscopy and excision gangiion cyst   CESAREAN SECTION  03/24/2007   @[facility]  by dr d. marget   CESAREAN SECTION N/A 04/02/2013   Procedure: REPEAT CESAREAN SECTION;  Surgeon: Shanda SHAUNNA Muscat, MD;  Location: [facility];  Service: Obstetrics;  Laterality: N/A;  WITH  BILATERAL TUBAL LIGATION   CHOLECYSTECTOMY, LAPAROSCOPIC  01/2006   @[facility]    CYSTOSCOPY N/A 12/24/2023   Procedure: CYSTOSCOPY;  Surgeon: Jeralyn Crutch, MD;  Location: [facility];  Service: Gynecology;  Laterality: N/A;   DILATATION & CURETTAGE/HYSTEROSCOPY WITH MYOSURE N/A 04/13/2022   Procedure: HYSTEROSCOPY WITH MYOSURE RESECTION; DILATION AND CURETTAGE;  Surgeon: Rutherford Gain, MD;  Location: [facility];  Service: Gynecology;  Laterality: N/A;   DILATION AND CURETTAGE OF UTERUS  11/25/2000   @ [facility];   W/  SUCTION   DILATION AND EVACUATION  05/05/2012   Procedure: DILATATION AND EVACUATION;  Surgeon: Nena DELENA App, MD;  Location: [facility];  Service: Gynecology;  Laterality: N/A;   FOOT TENDON TRANSFER Left 2004   HAMMER TOE SURGERY  09/22/2009   @[facility]  by dr verta;   recorrection of second, third, fourth, and fifth toes   INGUINAL HERNIA REPAIR Left 1984   KNEE ARTHROSCOPY Right 07/09/2001   LCL RECONSTRUCTION (by dr rubie)   KNEE ARTHROSCOPY Right 04/09/2001   LCL RECONSTRUCTION AND PCL REPAIR (by dr rubie)   KNEE ARTHROSCOPY W/ ACL RECONSTRUCTION Right 2002   AND PCL RECONSTRUCTIONS BY DR LUCEY   ROBOTIC ASSISTED TOTAL HYSTERECTOMY N/A 12/24/2023   Procedure: HYSTERECTOMY, TOTAL, ROBOT-ASSISTED;  Surgeon: Jeralyn Crutch, MD;  Location: [facility];  Service: Gynecology;  Laterality: N/A;  TLH and Cystoscopy   TOTAL KNEE ARTHROPLASTY Right 11/09/2019   Procedure: TOTAL KNEE ARTHROPLASTY;  Surgeon: Rubie Kemps, MD;  Location: [facility];  Service: Orthopedics;  Laterality: Right;    FAMILY HISTORY: Family History  Problem Relation Age of Onset   Stroke Mother    Kidney disease Mother        ESRD   Multiple sclerosis Mother    Depression Mother    Breast cancer Mother 36   Ovarian cancer Mother 88       maybe ut?   Thyroid  cancer Mother 33       reports it was fairly 'devastating, possibly aggresive one'   Bone cancer Mother 38       unsure if a separate  primary or met   Hypertension Father    Diabetes Father  type II   Sarcoidosis Father    Asthma Daughter    Hypertension Maternal Grandmother    Diabetes Maternal Grandmother    Bone cancer Maternal Grandmother 22   Diabetes Paternal Grandfather    Alzheimer's disease Paternal Grandfather    Breast cancer Other 50   Prostate cancer Maternal Uncle 48       no surgery   Lung cancer Maternal Grandfather 58   Leukemia Paternal Grandmother 24       died at 59   Breast cancer Cousin 84    SOCIAL HISTORY:  Social History   Socioeconomic History   Marital status: Married    Spouse name: Not on file   Number of children: Not on file   Years of education: Not on file   Highest education level: Not on file  Occupational History   Not on file  Tobacco Use   Smoking status: Never   Smokeless tobacco: Never  Vaping Use   Vaping status: Never Used  Substance and Sexual Activity   Alcohol use: Yes    Comment: occasional   Drug use: Never   Sexual activity: Yes    Birth control/protection: Surgical    Comment: BTL w/ c/s in 2014  Other Topics Concern   Not on file  Social History Narrative   Currently at [employer] for [employer]; wants to enroll in RN program.   Lives with boyfriend   Regular exercise: yes   Social Drivers of Corporate Investment Banker Strain: Not on file  Food Insecurity: No Food Insecurity (02/27/2024)   Hunger Vital Sign    Worried About Running Out of Food in the Last Year: Never true    Ran Out of Food in the Last Year: Never true  Transportation Needs: No Transportation Needs (01/03/2024)   Received from [employer]    In the past 12 months, has lack of reliable transportation kept you from medical appointments, meetings, work or from getting things needed for daily living? : No  Physical Activity: Not on file  Stress: Not on file  Social Connections: Not on file  Intimate Partner Violence: Not At Risk (12/24/2023)    Humiliation, Afraid, Rape, and Kick questionnaire    Fear of Current or Ex-Partner: No    Emotionally Abused: No    Physically Abused: No    Sexually Abused: No     PHYSICAL EXAM  Vitals:   03/05/24 1253  BP: 117/83  Pulse: 66  SpO2: 99%  Weight: 232 lb 8 oz (105.5 kg)  Height: 5' 9 (1.753 m)     Body mass index is 34.33 kg/m.   General: The patient is well-developed and well-nourished and in no acute distress.  Head and neck: The head is normocephalic and atraumatic.  She is tender over the right occiput, upper to mid cervical paraspinal muscles and trapezius muscle.  Range of motion is slightly reduced.  Neurologic Exam  Mental status: The patient is alert and oriented x 3 at the time of the examination. The patient has apparent normal recent and remote memory, with an apparently normal attention span and concentration ability.   Speech is normal.  Cranial nerves:   Extraocular muscles are normal.  Facial strength is normal.  Trapezius strength is normal.     No obvious hearing deficits are noted.  Motor:  Muscle bulk is normal.   Tone is mildly increased in right leg. Strength is  5 / 5 in the arms  and left leg and proximal right leg and 4+/5 right foot/ankle..  Strength was 4+/5 in the APB muscle and the pronator muscles of the left arm.  5/5 elsewhere in the left arm.  She has reduced rapid alternating movements in the feet  Sensory: Sensory testing shows reduced sensation to touch and to a lesser extent temperature in the left hand (old)   Now normal and symmetric sensation in legs   Coordination: She has good finger-nose-finger bilaterally.  Mildly reduced heel-to-shin both legs  Gait and station: Station is normal.   Her gait is near normal.  However the tandem gait is wide.  No Romberg sign.  Reflexes: Deep tendon reflexes are symmetric and normal bilaterally.       DIAGNOSTIC DATA (LABS, IMAGING, TESTING) - I reviewed patient records, labs, notes, testing  and imaging myself where available.  Lab Results  Component Value Date   WBC 3.7 (L) 12/19/2023   HGB 13.6 12/19/2023   HCT 39.8 12/19/2023   MCV 80.1 12/19/2023   PLT 257 12/19/2023      Component Value Date/Time   NA 136 12/19/2023 0930   NA 142 04/11/2021 1035   K 3.8 12/19/2023 0930   CL 104 12/19/2023 0930   CO2 23 12/19/2023 0930   GLUCOSE 81 12/19/2023 0930   BUN 8 12/19/2023 0930   BUN 9 04/11/2021 1035   CREATININE 0.80 12/19/2023 0930   CALCIUM 9.4 12/19/2023 0930   PROT 7.1 09/30/2023 1112   ALBUMIN 4.3 09/30/2023 1112   AST 11 09/30/2023 1112   ALT 7 09/30/2023 1112   ALKPHOS 46 09/30/2023 1112   BILITOT 0.4 09/30/2023 1112   GFRNONAA >60 12/19/2023 0930   GFRAA >60 11/09/2019 1140   Lab Results  Component Value Date   CHOL 182 03/19/2017   HDL 59.20 03/19/2017   LDLCALC 110 (H) 03/19/2017   TRIG 65.0 03/19/2017   CHOLHDL 3 03/19/2017   Lab Results  Component Value Date   HGBA1C 5.2 03/19/2017  DVT Lab Results  Component Value Date   VITAMINB12 330 09/27/2009   Lab Results  Component Value Date   TSH 0.539 08/29/2017       ASSESSMENT AND PLAN  Multiple sclerosis, relapsing-remitting - Plan: IgG, IgA, IgM, CBC with Differential/Platelets  High risk medication use - Plan: IgG, IgA, IgM, CBC with Differential/Platelets  Numbness  Neck pain  Myalgia  Other fatigue   1.  Continue kesimpta.   She had a clinical relapse earlier 2025.  Previously did Lemtrada in 2018 in 2019 and remained relapse free with stable MRIs until this episode 2.  Symptoms in left arm likely related to her MS 3.  stay active and exercise.    4.  Continue tizanidine /balcofen for spasms/myalgia 5.   RTC 6 months, will need MRI around that time.   Call sooner if new or worsening symptoms or other issues.   Dinita Migliaccio A. Vear, MD, PhD 03/05/2024, 2:58 PM Certified in Neurology, Clinical Neurophysiology, Sleep Medicine, Pain Medicine and Neuroimaging  [facility] [address], [address] [place], [place] [address] [phone]

## 2024-03-06 ENCOUNTER — Ambulatory Visit: Payer: Self-pay | Admitting: Neurology

## 2024-03-06 LAB — CBC WITH DIFFERENTIAL/PLATELET
Basophils Absolute: 0.1 x10E3/uL (ref 0.0–0.2)
Basos: 2 %
EOS (ABSOLUTE): 0.1 x10E3/uL (ref 0.0–0.4)
Eos: 1 %
Hematocrit: 42.3 % (ref 34.0–46.6)
Hemoglobin: 13.7 g/dL (ref 11.1–15.9)
Immature Grans (Abs): 0 x10E3/uL (ref 0.0–0.1)
Immature Granulocytes: 0 %
Lymphocytes Absolute: 1.1 x10E3/uL (ref 0.7–3.1)
Lymphs: 28 %
MCH: 28 pg (ref 26.6–33.0)
MCHC: 32.4 g/dL (ref 31.5–35.7)
MCV: 87 fL (ref 79–97)
Monocytes Absolute: 0.2 x10E3/uL (ref 0.1–0.9)
Monocytes: 5 %
Neutrophils Absolute: 2.6 x10E3/uL (ref 1.4–7.0)
Neutrophils: 63 %
Platelets: 257 x10E3/uL (ref 150–450)
RBC: 4.89 x10E6/uL (ref 3.77–5.28)
RDW: 14.6 % (ref 11.7–15.4)
WBC: 4 x10E3/uL (ref 3.4–10.8)

## 2024-03-06 LAB — IGG, IGA, IGM
IgG (Immunoglobin G), Serum: 1258 mg/dL (ref 586–1602)
IgM (Immunoglobulin M), Srm: 109 mg/dL (ref 26–217)
Immunoglobulin A, (IgA) QN, Serum: 432 mg/dL — ABNORMAL HIGH (ref 87–352)

## 2024-05-06 ENCOUNTER — Other Ambulatory Visit: Payer: Self-pay | Admitting: Neurology

## 2024-05-19 ENCOUNTER — Encounter: Payer: Self-pay | Admitting: Neurology

## 2024-05-20 ENCOUNTER — Telehealth: Payer: Self-pay | Admitting: *Deleted

## 2024-05-20 ENCOUNTER — Other Ambulatory Visit (HOSPITAL_COMMUNITY): Payer: Self-pay

## 2024-05-20 ENCOUNTER — Telehealth: Payer: Self-pay | Admitting: Pharmacy Technician

## 2024-05-20 MED ORDER — KESIMPTA 20 MG/0.4ML ~~LOC~~ SOAJ: 20.0000 mg | SUBCUTANEOUS | 11 refills | Status: AC

## 2024-05-20 NOTE — Telephone Encounter (Signed)
 Pt needs Kesimpta  PA asap with her new insurance. She attached it to chart. Pt started the Kesimpta  last year.

## 2024-05-20 NOTE — Telephone Encounter (Signed)
 Thank you :)

## 2024-05-20 NOTE — Telephone Encounter (Signed)
 Pharmacy Patient Advocate Encounter   Received notification from Pt Calls Messages that prior authorization for KESIMPTA   is required/requested.   Insurance verification completed.   The patient is insured through [employer].   Per test claim: PA required; PA submitted to above mentioned insurance via Fax Key/confirmation #/EOC [id number] Status is pending

## 2024-05-20 NOTE — Telephone Encounter (Signed)
 PA has been submitted, and telephone encounter has been created. Please see telephone encounter dated 1.28.26.

## 2024-05-21 ENCOUNTER — Other Ambulatory Visit (HOSPITAL_COMMUNITY): Payer: Self-pay

## 2024-05-21 NOTE — Telephone Encounter (Signed)
 PA request has been Submitted. New Encounter has been or will be created for follow up. For additional info see Pharmacy Prior Auth telephone encounter from 05-20-2024.

## 2024-05-22 NOTE — Telephone Encounter (Signed)
 Pharmacy Patient Advocate Encounter  Received notification from [facility] that Prior Authorization for Kesimpta  20 MG/0.4ML Pen has been APPROVED from 05-21-2024 to 05-21-2025   PA #/Case ID/Reference #: [id number]

## 2024-05-25 NOTE — Telephone Encounter (Signed)
 DONE

## 2024-10-15 ENCOUNTER — Ambulatory Visit: Admitting: Neurology
